# Patient Record
Sex: Female | Born: 1950
Health system: Southern US, Community
[De-identification: ages and names within clinical notes are randomized; demographics above are authoritative.]

## PROBLEM LIST (undated history)

## (undated) DIAGNOSIS — C801 Malignant (primary) neoplasm, unspecified: Secondary | ICD-10-CM

## (undated) DIAGNOSIS — F329 Major depressive disorder, single episode, unspecified: Secondary | ICD-10-CM

## (undated) DIAGNOSIS — M2550 Pain in unspecified joint: Secondary | ICD-10-CM

## (undated) DIAGNOSIS — G629 Polyneuropathy, unspecified: Secondary | ICD-10-CM

## (undated) DIAGNOSIS — I471 Supraventricular tachycardia, unspecified: Secondary | ICD-10-CM

## (undated) DIAGNOSIS — G8929 Other chronic pain: Secondary | ICD-10-CM

## (undated) DIAGNOSIS — G4730 Sleep apnea, unspecified: Secondary | ICD-10-CM

## (undated) DIAGNOSIS — K219 Gastro-esophageal reflux disease without esophagitis: Secondary | ICD-10-CM

## (undated) DIAGNOSIS — K5900 Constipation, unspecified: Secondary | ICD-10-CM

## (undated) DIAGNOSIS — R519 Headache, unspecified: Secondary | ICD-10-CM

## (undated) DIAGNOSIS — E119 Type 2 diabetes mellitus without complications: Secondary | ICD-10-CM

## (undated) DIAGNOSIS — F32A Depression, unspecified: Secondary | ICD-10-CM

## (undated) DIAGNOSIS — N3281 Overactive bladder: Secondary | ICD-10-CM

## (undated) DIAGNOSIS — Z8719 Personal history of other diseases of the digestive system: Secondary | ICD-10-CM

## (undated) DIAGNOSIS — N289 Disorder of kidney and ureter, unspecified: Secondary | ICD-10-CM

## (undated) DIAGNOSIS — R51 Headache: Secondary | ICD-10-CM

## (undated) DIAGNOSIS — E611 Iron deficiency: Secondary | ICD-10-CM

## (undated) DIAGNOSIS — Z8739 Personal history of other diseases of the musculoskeletal system and connective tissue: Secondary | ICD-10-CM

## (undated) DIAGNOSIS — F419 Anxiety disorder, unspecified: Secondary | ICD-10-CM

## (undated) DIAGNOSIS — Z8709 Personal history of other diseases of the respiratory system: Secondary | ICD-10-CM

## (undated) DIAGNOSIS — Z860100 Personal history of colon polyps, unspecified: Secondary | ICD-10-CM

## (undated) DIAGNOSIS — Z8619 Personal history of other infectious and parasitic diseases: Secondary | ICD-10-CM

## (undated) DIAGNOSIS — M549 Dorsalgia, unspecified: Secondary | ICD-10-CM

## (undated) DIAGNOSIS — R39198 Other difficulties with micturition: Secondary | ICD-10-CM

## (undated) DIAGNOSIS — E785 Hyperlipidemia, unspecified: Secondary | ICD-10-CM

## (undated) DIAGNOSIS — I10 Essential (primary) hypertension: Secondary | ICD-10-CM

## (undated) DIAGNOSIS — K859 Acute pancreatitis without necrosis or infection, unspecified: Secondary | ICD-10-CM

## (undated) DIAGNOSIS — R569 Unspecified convulsions: Secondary | ICD-10-CM

## (undated) DIAGNOSIS — M1990 Unspecified osteoarthritis, unspecified site: Secondary | ICD-10-CM

## (undated) HISTORY — PX: COLONOSCOPY: SHX174

## (undated) HISTORY — PX: TONSILLECTOMY: SUR1361

## (undated) HISTORY — PX: ABDOMINAL HYSTERECTOMY: SHX81

## (undated) HISTORY — PX: GASTRIC BYPASS: SHX52

## (undated) HISTORY — PX: LAPAROSCOPIC BILATERAL SALPINGO OOPHERECTOMY: SHX5890

## (undated) HISTORY — PX: JOINT REPLACEMENT: SHX530

## (undated) HISTORY — PX: EYE SURGERY: SHX253

## (undated) HISTORY — PX: HERNIA REPAIR: SHX51

## (undated) HISTORY — PX: ESOPHAGOGASTRODUODENOSCOPY: SHX1529

## (undated) HISTORY — PX: BACK SURGERY: SHX140

---

## 2013-01-10 ENCOUNTER — Other Ambulatory Visit: Payer: Self-pay | Admitting: Neurosurgery

## 2013-01-24 ENCOUNTER — Encounter (HOSPITAL_COMMUNITY): Payer: Self-pay | Admitting: Pharmacy Technician

## 2013-01-26 ENCOUNTER — Encounter (HOSPITAL_COMMUNITY)
Admission: RE | Admit: 2013-01-26 | Discharge: 2013-01-26 | Disposition: A | Discharge status: Home / Self Care | Payer: 59 | Source: Ambulatory Visit | Attending: Neurosurgery | Admitting: Neurosurgery

## 2013-01-26 ENCOUNTER — Encounter (HOSPITAL_COMMUNITY): Payer: Self-pay

## 2013-01-26 HISTORY — DX: Personal history of other diseases of the digestive system: Z87.19

## 2013-01-26 HISTORY — DX: Essential (primary) hypertension: I10

## 2013-01-26 HISTORY — DX: Major depressive disorder, single episode, unspecified: F32.9

## 2013-01-26 HISTORY — DX: Unspecified osteoarthritis, unspecified site: M19.90

## 2013-01-26 HISTORY — DX: Depression, unspecified: F32.A

## 2013-01-26 HISTORY — DX: Anxiety disorder, unspecified: F41.9

## 2013-01-26 LAB — BASIC METABOLIC PANEL
CO2: 31 mEq/L (ref 19–32)
Calcium: 9.5 mg/dL (ref 8.4–10.5)
Chloride: 102 mEq/L (ref 96–112)
Potassium: 3.1 mEq/L — ABNORMAL LOW (ref 3.5–5.1)
Sodium: 143 mEq/L (ref 135–145)

## 2013-01-26 LAB — CBC
Hemoglobin: 13.9 g/dL (ref 12.0–15.0)
MCV: 81.4 fL (ref 78.0–100.0)
Platelets: 338 10*3/uL (ref 150–400)
RBC: 5.1 MIL/uL (ref 3.87–5.11)
WBC: 8.2 10*3/uL (ref 4.0–10.5)

## 2013-01-26 LAB — TYPE AND SCREEN

## 2013-01-26 LAB — ABO/RH: ABO/RH(D): O POS

## 2013-01-26 LAB — SURGICAL PCR SCREEN: Staphylococcus aureus: NEGATIVE

## 2013-01-26 NOTE — Pre-Procedure Instructions (Signed)
[name] [name]  01/26/2013   Your procedure is scheduled on:  02/06/13  Report to [facility] at 845 AM.  Call this number if you have problems the morning of surgery: [phone]   Remember:   Do not eat food or drink liquids after midnight.   Take these medicines the morning of surgery with A SIP OF WATER: xanax,abilify,cymbalta,hydrocodone,toprol   Do not wear jewelry, make-up or nail polish.  Do not wear lotions, powders, or perfumes. You may wear deodorant.  Do not shave 48 hours prior to surgery. Men may shave face and neck.  Do not bring valuables to the hospital.  Contacts, dentures or bridgework may not be worn into surgery.  Leave suitcase in the car. After surgery it may be brought to your room.  For patients admitted to the hospital, checkout time is 11:00 AM the day of  discharge.   Patients discharged the day of surgery will not be allowed to drive  home.  Name and phone number of your driver: family  Special Instructions: Shower using CHG 2 nights before surgery and the night before surgery.  If you shower the day of surgery use CHG.  Use special wash - you have one bottle of CHG for all showers.  You should use approximately 1/3 of the bottle for each shower.   Please read over the following fact sheets that you were given: Pain Booklet, Coughing and Deep Breathing, Blood Transfusion Information, MRSA Information and Surgical Site Infection Prevention

## 2013-02-05 MED ORDER — DEXAMETHASONE SODIUM PHOSPHATE 10 MG/ML IJ SOLN
10.0000 mg | INTRAMUSCULAR | Status: AC
  Administered 2013-02-06: 10 mg via INTRAVENOUS
  Filled 2013-02-05: qty 1

## 2013-02-05 MED ORDER — CEFAZOLIN SODIUM-DEXTROSE 2-3 GM-% IV SOLR
2.0000 g | INTRAVENOUS | Status: DC
  Filled 2013-02-05: qty 50

## 2013-02-06 ENCOUNTER — Encounter (HOSPITAL_COMMUNITY): Payer: Self-pay | Admitting: Anesthesiology

## 2013-02-06 ENCOUNTER — Ambulatory Visit (HOSPITAL_COMMUNITY): Payer: 59

## 2013-02-06 ENCOUNTER — Encounter (HOSPITAL_COMMUNITY)
Admission: RE | Disposition: A | Discharge status: Home / Self Care | Payer: Self-pay | Source: Ambulatory Visit | Attending: Neurosurgery

## 2013-02-06 ENCOUNTER — Ambulatory Visit (HOSPITAL_COMMUNITY): Payer: 59 | Admitting: Anesthesiology

## 2013-02-06 ENCOUNTER — Inpatient Hospital Stay (HOSPITAL_COMMUNITY)
Admission: RE | Admit: 2013-02-06 | Discharge: 2013-02-10 | DRG: 460 | Disposition: A | Discharge status: Home / Self Care | Payer: 59 | Source: Ambulatory Visit | Attending: Neurosurgery | Admitting: Neurosurgery

## 2013-02-06 ENCOUNTER — Ambulatory Visit (HOSPITAL_COMMUNITY)
Admission: RE | Admit: 2013-02-06 | Discharge: 2013-02-06 | Disposition: A | Discharge status: Home / Self Care | Payer: 59 | Source: Ambulatory Visit | Attending: Neurosurgery | Admitting: Neurosurgery

## 2013-02-06 DIAGNOSIS — I10 Essential (primary) hypertension: Secondary | ICD-10-CM | POA: Diagnosis present

## 2013-02-06 DIAGNOSIS — M48062 Spinal stenosis, lumbar region with neurogenic claudication: Principal | ICD-10-CM | POA: Diagnosis present

## 2013-02-06 DIAGNOSIS — F3289 Other specified depressive episodes: Secondary | ICD-10-CM | POA: Diagnosis present

## 2013-02-06 DIAGNOSIS — K219 Gastro-esophageal reflux disease without esophagitis: Secondary | ICD-10-CM | POA: Diagnosis present

## 2013-02-06 DIAGNOSIS — F411 Generalized anxiety disorder: Secondary | ICD-10-CM | POA: Diagnosis present

## 2013-02-06 DIAGNOSIS — M4310 Spondylolisthesis, site unspecified: Secondary | ICD-10-CM | POA: Insufficient documentation

## 2013-02-06 DIAGNOSIS — Z79899 Other long term (current) drug therapy: Secondary | ICD-10-CM

## 2013-02-06 DIAGNOSIS — Z6839 Body mass index (BMI) 39.0-39.9, adult: Secondary | ICD-10-CM

## 2013-02-06 DIAGNOSIS — Q762 Congenital spondylolisthesis: Secondary | ICD-10-CM

## 2013-02-06 DIAGNOSIS — M129 Arthropathy, unspecified: Secondary | ICD-10-CM | POA: Diagnosis present

## 2013-02-06 DIAGNOSIS — K449 Diaphragmatic hernia without obstruction or gangrene: Secondary | ICD-10-CM | POA: Diagnosis present

## 2013-02-06 DIAGNOSIS — R079 Chest pain, unspecified: Secondary | ICD-10-CM | POA: Diagnosis not present

## 2013-02-06 SURGERY — POSTERIOR LUMBAR FUSION 1 LEVEL
Anesthesia: General | Site: Back | Wound class: Clean

## 2013-02-06 MED ORDER — SODIUM CHLORIDE 0.9 % IV SOLN: 250.0000 mL | INTRAVENOUS | Status: DC

## 2013-02-06 MED ORDER — FENTANYL CITRATE 0.05 MG/ML IJ SOLN
INTRAMUSCULAR | Status: DC | PRN
  Administered 2013-02-06 (×2): 50 ug via INTRAVENOUS
  Administered 2013-02-06: 100 ug via INTRAVENOUS
  Administered 2013-02-06 (×2): 50 ug via INTRAVENOUS
  Administered 2013-02-06: 100 ug via INTRAVENOUS

## 2013-02-06 MED ORDER — OXYCODONE HCL 5 MG PO TABS: 5.0000 mg | ORAL_TABLET | Freq: Once | ORAL | Status: DC | PRN

## 2013-02-06 MED ORDER — LIDOCAINE HCL 1 % IJ SOLN
INTRAMUSCULAR | Status: DC | PRN
  Administered 2013-02-06: 100 mg via INTRADERMAL

## 2013-02-06 MED ORDER — BACITRACIN 50000 UNITS IM SOLR
INTRAMUSCULAR | Status: AC
  Filled 2013-02-06: qty 1

## 2013-02-06 MED ORDER — NEOSTIGMINE METHYLSULFATE 1 MG/ML IJ SOLN
INTRAMUSCULAR | Status: DC | PRN
  Administered 2013-02-06: 4 mg via INTRAVENOUS

## 2013-02-06 MED ORDER — VITAMIN C 500 MG PO TABS
1000.0000 mg | ORAL_TABLET | Freq: Every day | ORAL | Status: DC
  Administered 2013-02-06 – 2013-02-09 (×4): 1000 mg via ORAL
  Filled 2013-02-06 (×5): qty 2

## 2013-02-06 MED ORDER — ACETAMINOPHEN 325 MG PO TABS: 650.0000 mg | ORAL_TABLET | ORAL | Status: DC | PRN

## 2013-02-06 MED ORDER — 0.9 % SODIUM CHLORIDE (POUR BTL) OPTIME
TOPICAL | Status: DC | PRN
  Administered 2013-02-06: 1000 mL

## 2013-02-06 MED ORDER — OXYCODONE HCL 5 MG/5ML PO SOLN: 5.0000 mg | Freq: Once | ORAL | Status: DC | PRN

## 2013-02-06 MED ORDER — MIDAZOLAM HCL 5 MG/5ML IJ SOLN
INTRAMUSCULAR | Status: DC | PRN
  Administered 2013-02-06: 2 mg via INTRAVENOUS

## 2013-02-06 MED ORDER — ARIPIPRAZOLE 2 MG PO TABS
2.0000 mg | ORAL_TABLET | Freq: Every day | ORAL | Status: DC
Start: 2013-02-07 — End: 2013-02-10
  Administered 2013-02-07 – 2013-02-09 (×3): 2 mg via ORAL
  Filled 2013-02-06 (×4): qty 1

## 2013-02-06 MED ORDER — SODIUM CHLORIDE 0.9 % IR SOLN
Status: DC | PRN
  Administered 2013-02-06: 12:00:00

## 2013-02-06 MED ORDER — ARTIFICIAL TEARS OP OINT
TOPICAL_OINTMENT | OPHTHALMIC | Status: DC | PRN
  Administered 2013-02-06: 1 via OPHTHALMIC

## 2013-02-06 MED ORDER — ACETAMINOPHEN 650 MG RE SUPP: 650.0000 mg | RECTAL | Status: DC | PRN

## 2013-02-06 MED ORDER — THROMBIN 20000 UNITS EX SOLR
CUTANEOUS | Status: DC | PRN
  Administered 2013-02-06: 12:00:00 via TOPICAL

## 2013-02-06 MED ORDER — OXYCODONE-ACETAMINOPHEN 5-325 MG PO TABS
1.0000 | ORAL_TABLET | ORAL | Status: DC | PRN
  Administered 2013-02-06 – 2013-02-07 (×4): 2 via ORAL
  Administered 2013-02-07: 1 via ORAL
  Administered 2013-02-07: 2 via ORAL
  Administered 2013-02-08: 1 via ORAL
  Administered 2013-02-08: 2 via ORAL
  Administered 2013-02-08: 1 via ORAL
  Administered 2013-02-08 – 2013-02-09 (×2): 2 via ORAL
  Administered 2013-02-09: 1 via ORAL
  Administered 2013-02-10: 2 via ORAL
  Filled 2013-02-06 (×4): qty 2
  Filled 2013-02-06: qty 1
  Filled 2013-02-06 (×8): qty 2

## 2013-02-06 MED ORDER — FUROSEMIDE 20 MG PO TABS: 20.0000 mg | ORAL_TABLET | Freq: Two times a day (BID) | ORAL | Status: DC

## 2013-02-06 MED ORDER — ADULT MULTIVITAMIN W/MINERALS CH
1.0000 | ORAL_TABLET | Freq: Two times a day (BID) | ORAL | Status: DC
  Administered 2013-02-06 – 2013-02-09 (×7): 1 via ORAL
  Filled 2013-02-06 (×9): qty 1

## 2013-02-06 MED ORDER — LIDOCAINE-EPINEPHRINE 1 %-1:100000 IJ SOLN
INTRAMUSCULAR | Status: DC | PRN
  Administered 2013-02-06: 10 mL

## 2013-02-06 MED ORDER — PHENOL 1.4 % MT LIQD: 1.0000 | OROMUCOSAL | Status: DC | PRN

## 2013-02-06 MED ORDER — CYCLOBENZAPRINE HCL 10 MG PO TABS
10.0000 mg | ORAL_TABLET | Freq: Three times a day (TID) | ORAL | Status: DC | PRN
  Administered 2013-02-07 – 2013-02-09 (×4): 10 mg via ORAL
  Filled 2013-02-06 (×4): qty 1

## 2013-02-06 MED ORDER — DOCUSATE SODIUM 100 MG PO CAPS
100.0000 mg | ORAL_CAPSULE | Freq: Two times a day (BID) | ORAL | Status: DC
  Administered 2013-02-06 – 2013-02-09 (×7): 100 mg via ORAL
  Filled 2013-02-06 (×7): qty 1

## 2013-02-06 MED ORDER — HYDROXYZINE HCL 25 MG PO TABS: 25.0000 mg | ORAL_TABLET | Freq: Three times a day (TID) | ORAL | Status: DC | PRN

## 2013-02-06 MED ORDER — METOCLOPRAMIDE HCL 5 MG/ML IJ SOLN: 10.0000 mg | Freq: Once | INTRAMUSCULAR | Status: DC | PRN

## 2013-02-06 MED ORDER — FUROSEMIDE 40 MG PO TABS
40.0000 mg | ORAL_TABLET | Freq: Every day | ORAL | Status: DC
  Administered 2013-02-07 – 2013-02-09 (×3): 40 mg via ORAL
  Filled 2013-02-06 (×4): qty 1

## 2013-02-06 MED ORDER — HYDROMORPHONE HCL PF 1 MG/ML IJ SOLN
0.2500 mg | INTRAMUSCULAR | Status: DC | PRN
  Administered 2013-02-06 (×2): 0.5 mg via INTRAVENOUS

## 2013-02-06 MED ORDER — HYDROMORPHONE HCL PF 1 MG/ML IJ SOLN
INTRAMUSCULAR | Status: AC
  Filled 2013-02-06: qty 1

## 2013-02-06 MED ORDER — ACETAMINOPHEN 10 MG/ML IV SOLN
INTRAVENOUS | Status: AC
  Administered 2013-02-06: 1000 mg via INTRAVENOUS
  Filled 2013-02-06: qty 100

## 2013-02-06 MED ORDER — SODIUM CHLORIDE 0.9 % IV SOLN
INTRAVENOUS | Status: AC
  Filled 2013-02-06: qty 500

## 2013-02-06 MED ORDER — VANCOMYCIN HCL IN DEXTROSE 1-5 GM/200ML-% IV SOLN
INTRAVENOUS | Status: AC
  Administered 2013-02-06: 1000 mg via INTRAVENOUS
  Filled 2013-02-06: qty 200

## 2013-02-06 MED ORDER — PHENTERMINE HCL 37.5 MG PO CAPS: 37.5000 mg | ORAL_CAPSULE | Freq: Two times a day (BID) | ORAL | Status: DC

## 2013-02-06 MED ORDER — PROPOFOL 10 MG/ML IV BOLUS
INTRAVENOUS | Status: DC | PRN
  Administered 2013-02-06: 200 mg via INTRAVENOUS

## 2013-02-06 MED ORDER — SODIUM CHLORIDE 0.9 % IJ SOLN
3.0000 mL | Freq: Two times a day (BID) | INTRAMUSCULAR | Status: DC
  Administered 2013-02-06 – 2013-02-09 (×7): 3 mL via INTRAVENOUS

## 2013-02-06 MED ORDER — ALPRAZOLAM 0.25 MG PO TABS
0.2500 mg | ORAL_TABLET | Freq: Three times a day (TID) | ORAL | Status: DC | PRN
  Administered 2013-02-06 – 2013-02-09 (×5): 0.5 mg via ORAL
  Filled 2013-02-06 (×5): qty 2

## 2013-02-06 MED ORDER — FUROSEMIDE 20 MG PO TABS
20.0000 mg | ORAL_TABLET | Freq: Every day | ORAL | Status: DC
  Administered 2013-02-06 – 2013-02-09 (×4): 20 mg via ORAL
  Filled 2013-02-06 (×5): qty 1

## 2013-02-06 MED ORDER — VANCOMYCIN HCL 10 G IV SOLR
1250.0000 mg | Freq: Two times a day (BID) | INTRAVENOUS | Status: DC
  Administered 2013-02-06 – 2013-02-08 (×4): 1250 mg via INTRAVENOUS
  Filled 2013-02-06 (×5): qty 1250

## 2013-02-06 MED ORDER — DULOXETINE HCL 60 MG PO CPEP
60.0000 mg | ORAL_CAPSULE | Freq: Two times a day (BID) | ORAL | Status: DC
Start: 2013-02-06 — End: 2013-02-10
  Administered 2013-02-06 – 2013-02-09 (×7): 60 mg via ORAL
  Filled 2013-02-06 (×9): qty 1

## 2013-02-06 MED ORDER — GLYCOPYRROLATE 0.2 MG/ML IJ SOLN
INTRAMUSCULAR | Status: DC | PRN
  Administered 2013-02-06: 0.4 mg via INTRAVENOUS

## 2013-02-06 MED ORDER — HYDROMORPHONE HCL PF 1 MG/ML IJ SOLN
0.5000 mg | INTRAMUSCULAR | Status: DC | PRN
  Administered 2013-02-06 – 2013-02-09 (×4): 1 mg via INTRAVENOUS
  Filled 2013-02-06 (×4): qty 1

## 2013-02-06 MED ORDER — LACTATED RINGERS IV SOLN
INTRAVENOUS | Status: DC | PRN
  Administered 2013-02-06 (×2): via INTRAVENOUS

## 2013-02-06 MED ORDER — METOPROLOL TARTRATE 1 MG/ML IV SOLN
INTRAVENOUS | Status: DC | PRN
  Administered 2013-02-06: 2 mg via INTRAVENOUS

## 2013-02-06 MED ORDER — METOPROLOL SUCCINATE ER 25 MG PO TB24
25.0000 mg | ORAL_TABLET | Freq: Every day | ORAL | Status: DC
Start: 2013-02-07 — End: 2013-02-10
  Administered 2013-02-07 – 2013-02-09 (×3): 25 mg via ORAL
  Filled 2013-02-06 (×4): qty 1

## 2013-02-06 MED ORDER — MENTHOL 3 MG MT LOZG: 1.0000 | LOZENGE | OROMUCOSAL | Status: DC | PRN

## 2013-02-06 MED ORDER — ATORVASTATIN CALCIUM 10 MG PO TABS
10.0000 mg | ORAL_TABLET | Freq: Every day | ORAL | Status: DC
  Administered 2013-02-06 – 2013-02-09 (×4): 10 mg via ORAL
  Filled 2013-02-06 (×5): qty 1

## 2013-02-06 MED ORDER — LIDOCAINE HCL 4 % MT SOLN
OROMUCOSAL | Status: DC | PRN
  Administered 2013-02-06: 4 mL via TOPICAL

## 2013-02-06 MED ORDER — PROMETHAZINE HCL 25 MG PO TABS
25.0000 mg | ORAL_TABLET | Freq: Four times a day (QID) | ORAL | Status: DC | PRN
  Administered 2013-02-09 (×2): 25 mg via ORAL
  Filled 2013-02-06 (×2): qty 1

## 2013-02-06 MED ORDER — LACTATED RINGERS IV SOLN
INTRAVENOUS | Status: DC | PRN
  Administered 2013-02-06: 10:00:00 via INTRAVENOUS

## 2013-02-06 MED ORDER — SODIUM CHLORIDE 0.9 % IJ SOLN: 3.0000 mL | INTRAMUSCULAR | Status: DC | PRN

## 2013-02-06 MED ORDER — ZOLPIDEM TARTRATE 5 MG PO TABS
5.0000 mg | ORAL_TABLET | Freq: Every evening | ORAL | Status: DC | PRN
  Administered 2013-02-07 – 2013-02-09 (×2): 5 mg via ORAL
  Filled 2013-02-06 (×2): qty 1

## 2013-02-06 MED ORDER — BUPIVACAINE HCL (PF) 0.25 % IJ SOLN
INTRAMUSCULAR | Status: DC | PRN
  Administered 2013-02-06: 10 mL

## 2013-02-06 MED ORDER — CLONIDINE HCL 0.1 MG PO TABS
0.1000 mg | ORAL_TABLET | Freq: Two times a day (BID) | ORAL | Status: DC
Start: 2013-02-06 — End: 2013-02-10
  Administered 2013-02-06 – 2013-02-09 (×7): 0.1 mg via ORAL
  Filled 2013-02-06 (×9): qty 1

## 2013-02-06 MED ORDER — ONDANSETRON HCL 4 MG/2ML IJ SOLN: 4.0000 mg | INTRAMUSCULAR | Status: DC | PRN

## 2013-02-06 MED ORDER — ONDANSETRON HCL 4 MG/2ML IJ SOLN
INTRAMUSCULAR | Status: DC | PRN
  Administered 2013-02-06: 4 mg via INTRAVENOUS

## 2013-02-06 MED ORDER — ROCURONIUM BROMIDE 100 MG/10ML IV SOLN
INTRAVENOUS | Status: DC | PRN
Start: 2013-02-06 — End: 2013-02-06
  Administered 2013-02-06: 10 mg via INTRAVENOUS
  Administered 2013-02-06: 50 mg via INTRAVENOUS

## 2013-02-06 SURGICAL SUPPLY — 66 items
BAG DECANTER FOR FLEXI CONT (MISCELLANEOUS) ×2 IMPLANT
BENZOIN TINCTURE PRP APPL 2/3 (GAUZE/BANDAGES/DRESSINGS) ×2 IMPLANT
BLADE SURG 11 STRL SS (BLADE) ×2 IMPLANT
BLADE SURG ROTATE 9660 (MISCELLANEOUS) IMPLANT
BRUSH SCRUB EZ PLAIN DRY (MISCELLANEOUS) ×2 IMPLANT
BUR MATCHSTICK NEURO 3.0 LAGG (BURR) ×2 IMPLANT
BUR PRECISION FLUTE 6.0 (BURR) ×2 IMPLANT
CANISTER SUCTION 2500CC (MISCELLANEOUS) ×2 IMPLANT
CAP LOCKING REVERE (Cap) ×8 IMPLANT
CLOTH BEACON ORANGE TIMEOUT ST (SAFETY) ×2 IMPLANT
CONT SPEC 4OZ CLIKSEAL STRL BL (MISCELLANEOUS) ×4 IMPLANT
COVER BACK TABLE 24X17X13 BIG (DRAPES) IMPLANT
COVER TABLE BACK 60X90 (DRAPES) ×2 IMPLANT
DECANTER SPIKE VIAL GLASS SM (MISCELLANEOUS) ×2 IMPLANT
DERMABOND ADVANCED (GAUZE/BANDAGES/DRESSINGS) ×1
DERMABOND ADVANCED .7 DNX12 (GAUZE/BANDAGES/DRESSINGS) ×1 IMPLANT
DRAPE C-ARM 42X72 X-RAY (DRAPES) ×4 IMPLANT
DRAPE LAPAROTOMY 100X72X124 (DRAPES) ×2 IMPLANT
DRAPE POUCH INSTRU U-SHP 10X18 (DRAPES) ×2 IMPLANT
DRAPE PROXIMA HALF (DRAPES) IMPLANT
DRAPE SURG 17X23 STRL (DRAPES) ×2 IMPLANT
DRSG OPSITE 4X5.5 SM (GAUZE/BANDAGES/DRESSINGS) ×2 IMPLANT
ELECT REM PT RETURN 9FT ADLT (ELECTROSURGICAL) ×2
ELECTRODE REM PT RTRN 9FT ADLT (ELECTROSURGICAL) ×1 IMPLANT
EVACUATOR 3/16  PVC DRAIN (DRAIN) ×1
EVACUATOR 3/16 PVC DRAIN (DRAIN) ×1 IMPLANT
GAUZE SPONGE 4X4 16PLY XRAY LF (GAUZE/BANDAGES/DRESSINGS) ×2 IMPLANT
GLOVE BIO SURGEON STRL SZ8 (GLOVE) ×4 IMPLANT
GLOVE BIOGEL PI IND STRL 7.5 (GLOVE) ×1 IMPLANT
GLOVE BIOGEL PI INDICATOR 7.5 (GLOVE) ×1
GLOVE ECLIPSE 7.5 STRL STRAW (GLOVE) ×2 IMPLANT
GLOVE ECLIPSE 8.5 STRL (GLOVE) ×2 IMPLANT
GLOVE EXAM NITRILE LRG STRL (GLOVE) ×4 IMPLANT
GLOVE EXAM NITRILE MD LF STRL (GLOVE) IMPLANT
GLOVE EXAM NITRILE XL STR (GLOVE) IMPLANT
GLOVE EXAM NITRILE XS STR PU (GLOVE) IMPLANT
GLOVE INDICATOR 8.5 STRL (GLOVE) ×4 IMPLANT
GLOVE SURG SS PI 8.0 STRL IVOR (GLOVE) ×6 IMPLANT
GOWN BRE IMP SLV AUR LG STRL (GOWN DISPOSABLE) IMPLANT
GOWN BRE IMP SLV AUR XL STRL (GOWN DISPOSABLE) ×8 IMPLANT
GOWN STRL REIN 2XL LVL4 (GOWN DISPOSABLE) IMPLANT
KIT BASIN OR (CUSTOM PROCEDURE TRAY) ×2 IMPLANT
KIT ROOM TURNOVER OR (KITS) ×2 IMPLANT
MILL MEDIUM DISP (BLADE) ×2 IMPLANT
NEEDLE HYPO 25X1 1.5 SAFETY (NEEDLE) ×2 IMPLANT
NS IRRIG 1000ML POUR BTL (IV SOLUTION) ×2 IMPLANT
PACK LAMINECTOMY NEURO (CUSTOM PROCEDURE TRAY) ×2 IMPLANT
PAD ARMBOARD 7.5X6 YLW CONV (MISCELLANEOUS) ×8 IMPLANT
ROD CURVED 5.5MMX50MM (Rod) ×4 IMPLANT
SCREW PEDICLE 6.5MMX45MM (Screw) ×4 IMPLANT
SCREW PEDICLE 6.5X40MM (Screw) ×4 IMPLANT
SCREW PEDICLE 6.5X45 (Screw) ×2 IMPLANT
SPACER CALIBER 10X22 9-13MM-12 (Spacer) ×4 IMPLANT
SPONGE GAUZE 4X4 12PLY (GAUZE/BANDAGES/DRESSINGS) ×2 IMPLANT
SPONGE LAP 4X18 X RAY DECT (DISPOSABLE) IMPLANT
SPONGE SURGIFOAM ABS GEL 100 (HEMOSTASIS) ×2 IMPLANT
STRIP CLOSURE SKIN 1/2X4 (GAUZE/BANDAGES/DRESSINGS) ×2 IMPLANT
SUT VIC AB 0 CT1 18XCR BRD8 (SUTURE) ×2 IMPLANT
SUT VIC AB 0 CT1 8-18 (SUTURE) ×2
SUT VIC AB 2-0 CT1 18 (SUTURE) ×2 IMPLANT
SUT VICRYL 4-0 PS2 18IN ABS (SUTURE) ×2 IMPLANT
SYR 20ML ECCENTRIC (SYRINGE) ×2 IMPLANT
TOWEL OR 17X24 6PK STRL BLUE (TOWEL DISPOSABLE) ×2 IMPLANT
TOWEL OR 17X26 10 PK STRL BLUE (TOWEL DISPOSABLE) ×2 IMPLANT
TRAY FOLEY CATH 14FRSI W/METER (CATHETERS) ×2 IMPLANT
WATER STERILE IRR 1000ML POUR (IV SOLUTION) ×2 IMPLANT

## 2013-02-06 NOTE — Anesthesia Preprocedure Evaluation (Addendum)
Anesthesia Evaluation  Patient identified by MRN, date of birth, ID band Patient awake    Reviewed: Allergy & Precautions, H&P , NPO status , Patient's Chart, lab work & pertinent test results, reviewed documented beta blocker date and time   History of Anesthesia Complications Negative for: history of anesthetic complications  Airway Mallampati: II TM Distance: >3 FB Neck ROM: full    Dental  (+) Teeth Intact, Caps and Dental Advisory Given   Pulmonary neg pulmonary ROS,  breath sounds clear to auscultation        Cardiovascular hypertension, Pt. on medications and Pt. on home beta blockers + dysrhythmias Supra Ventricular Tachycardia Rhythm:regular     Neuro/Psych PSYCHIATRIC DISORDERS Anxiety Depression negative neurological ROS     GI/Hepatic Neg liver ROS, hiatal hernia (s/p repair), Gastric bypass   Endo/Other  Morbid obesity  Renal/GU negative Renal ROS  negative genitourinary   Musculoskeletal  (+) Arthritis -, Osteoarthritis,    Abdominal (+) + obese,   Peds  Hematology negative hematology ROS (+)   Anesthesia Other Findings See surgeon's H&P   Reproductive/Obstetrics negative OB ROS                         Anesthesia Physical Anesthesia Plan  ASA: III  Anesthesia Plan: General   Post-op Pain Management:    Induction: Intravenous  Airway Management Planned: Oral ETT  Additional Equipment:   Intra-op Plan:   Post-operative Plan: Extubation in OR  Informed Consent: I have reviewed the patients History and Physical, chart, labs and discussed the procedure including the risks, benefits and alternatives for the proposed anesthesia with the patient or authorized representative who has indicated his/her understanding and acceptance.   Dental Advisory Given  Plan Discussed with: CRNA and Surgeon  Anesthesia Plan Comments:         Anesthesia Quick Evaluation

## 2013-02-06 NOTE — Progress Notes (Signed)
ANTIBIOTIC CONSULT NOTE - INITIAL  Pharmacy Consult for vancomycin Indication: prophylaxis while lumbar drain in s/p complex back surgery  Allergies  Allergen Reactions  . Bee Venom Anaphylaxis  . Erythrocin (Erythromycin)     Unknown reaction  . Fioricet (Butalbital-Apap-Caffeine) Other (See Comments)    dizziness  . Other     Barbiturates-reaction unknown.  Marland Kitchen Penicillins Other (See Comments)    Reaction unknown  . Clindamycin/Lincomycin Rash  . Morphine And Related Rash  . Phenobarbital Rash  . Teflaro (Ceftaroline) Rash  . Vancomycin Rash    Patient Measurements:    Body Weight: 95 kg Vital Signs: Temp: 98.1 F (36.7 C) (03/10 1606) Temp src: Oral (03/10 1606) BP: 147/71 mmHg (03/10 1606) Pulse Rate: 96 (03/10 1606) Intake/Output from previous day:   Intake/Output from this shift: Total I/O In: 2000 [I.V.:2000] Out: 455 [Urine:155; Blood:300]  Labs: No results found for this basename: WBC, HGB, PLT, LABCREA, CREATININE,  in the last 72 hours CrCl is unknown because there is no height on file for the current visit. No results found for this basename: VANCOTROUGH, VANCOPEAK, VANCORANDOM, GENTTROUGH, GENTPEAK, GENTRANDOM, TOBRATROUGH, TOBRAPEAK, TOBRARND, AMIKACINPEAK, AMIKACINTROU, AMIKACIN,  in the last 72 hours   Microbiology: No results found for this or any previous visit (from the past 720 hour(s)).  Medical History: Past Medical History  Diagnosis Date  . Hypertension   . Depression   . Anxiety   . H/O hiatal hernia   . Arthritis   . Cellulitis and abscess of leg     last yr    Medications:  Prescriptions prior to admission  Medication Sig Dispense Refill  . ALPRAZolam (XANAX) 0.5 MG tablet Take 0.25-0.5 mg by mouth 3 (three) times daily as needed for anxiety.      . ARIPiprazole (ABILIFY) 2 MG tablet Take 2 mg by mouth daily.      Marland Kitchen ascorbic acid (VITAMIN C) 1000 MG tablet Take 1,000 mg by mouth daily.      Marland Kitchen atorvastatin (LIPITOR) 10 MG tablet  Take 10 mg by mouth daily.      . bisacodyl (DULCOLAX) 5 MG EC tablet Take 5 mg by mouth daily as needed for constipation.      . Cholecalciferol (VITAMIN D PO) Take 2,000 Units by mouth daily.      . cloNIDine (CATAPRES) 0.1 MG tablet Take 0.1 mg by mouth 2 (two) times daily.      . DULoxetine (CYMBALTA) 60 MG capsule Take 60 mg by mouth 2 (two) times daily.      . fish oil-omega-3 fatty acids 1000 MG capsule Take 1 g by mouth 2 (two) times daily.      . furosemide (LASIX) 20 MG tablet Take 20-40 mg by mouth 2 (two) times daily. Take 2 tablets in the AM and 1 tablet in the PM      . HYDROcodone-acetaminophen (NORCO) 10-325 MG per tablet Take 0.5-1 tablets by mouth every 8 (eight) hours as needed for pain.      . hydrOXYzine (ATARAX/VISTARIL) 25 MG tablet Take 25 mg by mouth 3 (three) times daily as needed for itching.      . metoprolol succinate (TOPROL-XL) 25 MG 24 hr tablet Take 25 mg by mouth daily.      . Multiple Vitamin (MULTIVITAMIN WITH MINERALS) TABS Take 1 tablet by mouth 2 (two) times daily.      Marland Kitchen zolpidem (AMBIEN) 10 MG tablet Take 10 mg by mouth at bedtime as needed for sleep.      Marland Kitchen  phentermine 37.5 MG capsule Take 37.5 mg by mouth 2 (two) times daily.      . promethazine (PHENERGAN) 25 MG tablet Take 25 mg by mouth every 6 (six) hours as needed for nausea.       Assessment: [name] [name] is a 62 yo F s/o complex back surgery to continue on vancomycin per pharmacy protocol until her lumbar drain is removed.  She has a documented allergy of rash to vancomycin but she was given vanc 1 gm pre-op at 1047 am.   Wt 95 kg. Creat cl 86 ml/min.  Goal of Therapy:  Vancomycin trough level 10-15 mcg/ml  Plan:  1. Vancomycin 1250 mg IV q12h until lumbar drain out 2. F/u renal function, WBC, temp. Herby Abraham, Pharm.D. [phone] 02/06/2013 4:35 PM

## 2013-02-06 NOTE — Preoperative (Signed)
Beta Blockers   Reason not to administer Beta Blockers:Metoprolol 02/07/13 0600

## 2013-02-06 NOTE — Clinical Social Work Note (Signed)
Clinical Social Work   CSW received consult for SNF. Upon chart review, pt had surgery today and awaiting PT/OT evals for discharge recommendations. Evals are needed for insurance prior auth for SNF. CSW will assess pt for SNF, if appropriate. CSW will continue to follow.   Dede Query, MSW, [facility] [phone]

## 2013-02-06 NOTE — Anesthesia Postprocedure Evaluation (Signed)
Anesthesia Post Note  Patient: [name] [name]  Procedure(s) Performed: Procedure(s) (LRB): POSTERIOR LUMBAR FUSION 1 LEVEL (N/A)  Anesthesia type: general  Patient location: PACU  Post pain: Pain level controlled  Post assessment: Patient's Cardiovascular Status Stable  Last Vitals:  Filed Vitals:   02/06/13 1606  BP: 147/71  Pulse: 96  Temp: 36.7 C  Resp: 17    Post vital signs: Reviewed and stable  Level of consciousness: sedated  Complications: No apparent anesthesia complications

## 2013-02-06 NOTE — Transfer of Care (Signed)
Immediate Anesthesia Transfer of Care Note  Patient: [name] [name]  Procedure(s) Performed: Procedure(s) with comments: POSTERIOR LUMBAR FUSION 1 LEVEL (N/A) - Lumbar four to lumbar five posterior lumbar interbody fusion  Patient Location: PACU  Anesthesia Type:General  Level of Consciousness: awake, alert , oriented and patient cooperative  Airway & Oxygen Therapy: Patient Spontanous Breathing and Patient connected to face mask oxygen  Post-op Assessment: Report given to PACU RN and Post -op Vital signs reviewed and stable  Post vital signs: Reviewed and stable  Complications: No apparent anesthesia complications

## 2013-02-06 NOTE — Op Note (Signed)
Preoperative diagnosis: Grade 1 spondylolisthesis L4-5 with severe lumbar spinal stenosis L4-5 and severe foraminal stenosis of the L4 and L5 nerve root. Patient had severe mechanical low back pain and neurogenic claudication and right greater left L4 and L5 radiculopathies  Postoperative diagnosis: Same  Procedure: Decompressive lumbar laminectomy with more work than would be required in a standard interbody fusion at L4-5 with radical foraminotomies of the L4 and L5 nerve roots complete facetectomies bilaterally at L4-5  #2 posterior lumbar interbody fusion L4-5 using expandable caliber globus peek cages packed with local autograft mixed DBX  #3 pedicle screw fixation L4-5 using the globus 5.5 Revere pedicle to system  #4 posterior lateral arthrodesis L4-5 using local autograft mixed with DBX  #5 open reduction spinal deformity  #6 placement of large Hemovac drain  Surgeon: Jillyn Hidden cram  Assistant: Sherilyn Cooter pool  Anesthesia: Gen.  EBL: 400  History of present illness: Patient is a very pleasant 62 year old female who has had long-standing back pain and bilateral leg pain with neurogenic claudication he would radiate to her hips down the back and outside of her thighs into the front of her shin top of her feet and big toe. This was consistent with bilateral L4 and L5 radiculopathies. Workup with MRI scan and plain films revealed a spondylolisthesis at L4-5 with marked facet arthropathy and severe foraminal stenosis of the L4 and L5 nerve root. And due to patient's failure conservative treatment with physical therapy and epidural steroid injections she was recommended decompression stabilization procedure at L4-5 I extensively went over the risks and benefits of the operation with her as well as therapy course expectations of outcome and alternatives to surgery and she understood and agreed to proceed forward.  Operative procedure: Patient was brought into the or was induced under general  anesthesia positioned prone the Wilson frame her back was prepped and draped in routine sterile fashion. After preoperative x-ray localize the appropriate level the was infiltrated with 10 cc lidocaine with epi a midline incision was made and Bovie left car was taken the subcutaneous tissues and subperiosteal dissections care lamina of L4 and L5 bilaterally. Also exposed the TPS and pedicles of L4 and L5 bilaterally. Interoperative x-ray confirmed the appropriate levels the spinous process of L4 smooth central decompression was begun complete laminectomy centrally at L4 then there was marked facet arthropathy causing severe hourglass compression of thecal sac proximal aspect the L5 nerve root this was teased off the dura and a complete facetectomy was performed. In addition there was a large fragment of outcome of the superior tickling facet underneath the pars displacing the undersurface of the 4 roots predominantly on the left. This is all dissected off of the L4 nerve root and removed decompressing the L4 nerve root extensively upper foramen. Under biting superior tickling facet a ladder cyst lateral margins disc space and complete foraminotomies performed at L4 and L5. Again a decompression and foraminotomies the L4 and L5 nerve roots were widely decompressed dorsally. Attention second pedicle screw placement using a high-speed drill pilot holes were drilled fluoroscopy confirmed that the trajectory pedicle was cannulated probed 55 Probed again and 05/04/1944 screws inserted L4 bilaterally and 6 x 40 L5 bilaterally after all the screws and placed and position was confirmed with fluoroscopy attention second the interbody work. The spaces were coagulated and incised cleaned out and a size 10 distractor was inserted the patient's right the endplates (left with a 9 rotating cutter and Epstein curettes. After adequate endplate preparation a large central  disc removed 9 expandable cage was inserted and expanded 7  turns approximately 12-13 mm. Fluoroscopy again was used the step along the way to confirm Deppen trajectory. Then the distractor was removed the spaces. And some patient's right side local autograft packed mixed with DBX was packed centrally and then the 9 mm cage was inserted the patient's right side and expanded in similar fashion. After only a by [name] Lee implants and placed postop AP and lateral fluoroscopy confirmed good position of screws and implants the was then copious irrigated meticulous hemostasis was maintained aggressive decortication was care MTPs or lateral gutters the remainder local autograft DBX mixture was then packed posterior laterally. The spinal listhesis was significantly reduced after the interbody work. Then 2 rods were placed and all nuts were torqued down and anchored down. Then the large director was placed the nerve foraminal reinspected confirm patency Gelfoam was overlaid top of the dura and the muscle fascia proximal in layers with after Vicryl and the skin was closed with a running 4 septic or. Dermabond benzo and Steri-Strips were applied patient recovered in stable condition.

## 2013-02-06 NOTE — Anesthesia Procedure Notes (Signed)
Procedure Name: Intubation Date/Time: 02/06/2013 10:43 AM Performed by: Leona Singleton A Pre-anesthesia Checklist: Patient identified Patient Re-evaluated:Patient Re-evaluated prior to inductionOxygen Delivery Method: Circle system utilized Preoxygenation: Pre-oxygenation with 100% oxygen Intubation Type: IV induction Ventilation: Two handed mask ventilation required Laryngoscope Size: Miller and 2 Grade View: Grade I Tube type: Oral Tube size: 7.0 mm Number of attempts: 1 Airway Equipment and Method: Stylet and LTA kit utilized Placement Confirmation: ETT inserted through vocal cords under direct vision,  positive ETCO2 and breath sounds checked- equal and bilateral Secured at: 21 cm Tube secured with: Tape Dental Injury: Teeth and Oropharynx as per pre-operative assessment

## 2013-02-06 NOTE — H&P (Signed)
[name] [name] is an 62 y.o. female.   Chief Complaint: Back and right greater than left leg pain HPI: Patient is a very pleasant 62 year old female who presents with predominantly mechanical back pain but also right left leg radiculopathy consistent with an L4 and L5 nerve root pattern.  In addition she reports claudication very short distances less than a quarter block. Patient imaging studies revealed a grade 1 spondylolisthesis and severe spinal stenosis at L4-5 with severe stenosis of the L4 and L5 nerve roots bilaterally. Patient went through physical therapy epidural steroid injections and was refractory to all forms of conservative treatment. And due to her progression of pedicle syndrome and imaging findings and failure conservative treatment she was recommended decompressive position procedure at L4-5 extensively the risks benefits of the operation with her as well as  Of course and expectations of outcome alternatives of surgery and she understood and agreed to proceed forward.  Past Medical History  Diagnosis Date  . Hypertension   . Depression   . Anxiety   . H/O hiatal hernia   . Arthritis   . Cellulitis and abscess of leg     last yr    Past Surgical History  Procedure Laterality Date  . Cesarean section    . Hernia repair    . Abdominal hysterectomy    . Gastric bypass    . Joint replacement      bil knee  . Tonsillectomy      History reviewed. No pertinent family history. Social History:  reports that she has never smoked. She does not have any smokeless tobacco history on file. She reports that  drinks alcohol. She reports that she does not use illicit drugs.  Allergies:  Allergies  Allergen Reactions  . Bee Venom Anaphylaxis  . Erythrocin (Erythromycin)     Unknown reaction  . Fioricet (Butalbital-Apap-Caffeine) Other (See Comments)    dizziness  . Other     Barbiturates-reaction unknown.  Marland Kitchen Penicillins Other (See Comments)    Reaction unknown  .  Clindamycin/Lincomycin Rash  . Morphine And Related Rash  . Phenobarbital Rash  . Teflaro (Ceftaroline) Rash  . Vancomycin Rash    Medications Prior to Admission  Medication Sig Dispense Refill  . ALPRAZolam (XANAX) 0.5 MG tablet Take 0.25-0.5 mg by mouth 3 (three) times daily as needed for anxiety.      . ARIPiprazole (ABILIFY) 2 MG tablet Take 2 mg by mouth daily.      Marland Kitchen ascorbic acid (VITAMIN C) 1000 MG tablet Take 1,000 mg by mouth daily.      Marland Kitchen atorvastatin (LIPITOR) 10 MG tablet Take 10 mg by mouth daily.      . bisacodyl (DULCOLAX) 5 MG EC tablet Take 5 mg by mouth daily as needed for constipation.      . Cholecalciferol (VITAMIN D PO) Take 2,000 Units by mouth daily.      . cloNIDine (CATAPRES) 0.1 MG tablet Take 0.1 mg by mouth 2 (two) times daily.      . DULoxetine (CYMBALTA) 60 MG capsule Take 60 mg by mouth 2 (two) times daily.      . fish oil-omega-3 fatty acids 1000 MG capsule Take 1 g by mouth 2 (two) times daily.      . furosemide (LASIX) 20 MG tablet Take 20-40 mg by mouth 2 (two) times daily. Take 2 tablets in the AM and 1 tablet in the PM      . HYDROcodone-acetaminophen (NORCO) 10-325 MG per tablet  Take 0.5-1 tablets by mouth every 8 (eight) hours as needed for pain.      . hydrOXYzine (ATARAX/VISTARIL) 25 MG tablet Take 25 mg by mouth 3 (three) times daily as needed for itching.      . metoprolol succinate (TOPROL-XL) 25 MG 24 hr tablet Take 25 mg by mouth daily.      . Multiple Vitamin (MULTIVITAMIN WITH MINERALS) TABS Take 1 tablet by mouth 2 (two) times daily.      Marland Kitchen zolpidem (AMBIEN) 10 MG tablet Take 10 mg by mouth at bedtime as needed for sleep.      . phentermine 37.5 MG capsule Take 37.5 mg by mouth 2 (two) times daily.      . promethazine (PHENERGAN) 25 MG tablet Take 25 mg by mouth every 6 (six) hours as needed for nausea.        No results found for this or any previous visit (from the past 48 hour(s)). No results found.  Review of Systems   Constitutional: Negative.   HENT: Negative.   Eyes: Negative.   Respiratory: Negative.   Cardiovascular: Negative.   Gastrointestinal: Negative.   Genitourinary: Negative.   Musculoskeletal: Positive for myalgias, back pain and joint pain.  Skin: Negative.   Neurological: Negative.   Endo/Heme/Allergies: Negative.   Psychiatric/Behavioral: Negative.     Blood pressure 149/87, pulse 82, temperature 97.7 F (36.5 C), temperature source Oral, resp. rate 18, SpO2 97.00%. Physical Exam  Constitutional: She is oriented to person, place, and time. She appears well-developed.  Eyes: Pupils are equal, round, and reactive to light.  Neck: Normal range of motion.  Respiratory: Effort normal.  GI: Soft.  Neurological: She is alert and oriented to person, place, and time. She has normal strength. GCS eye subscore is 4. GCS verbal subscore is 5. GCS motor subscore is 6.  Reflex Scores:      Tricep reflexes are 1+ on the right side and 1+ on the left side.      Bicep reflexes are 1+ on the right side and 1+ on the left side.      Brachioradialis reflexes are 1+ on the right side and 1+ on the left side.      Patellar reflexes are 1+ on the right side and 1+ on the left side.      Achilles reflexes are 1+ on the right side and 1+ on the left side. Strength is 5 out of 5 in her iliopsoas, quads, hip she's, gastrocs, anterior tibialis, and EHL.     Assessment/Plan 62 year female presents for an L4-5 decompression and fusion.  CRAM,GARY P 02/06/2013, 9:48 AM

## 2013-02-07 MED FILL — Heparin Sodium (Porcine) Inj 1000 Unit/ML: INTRAMUSCULAR | Qty: 30 | Status: AC

## 2013-02-07 MED FILL — Sodium Chloride Irrigation Soln 0.9%: Qty: 3000 | Status: AC

## 2013-02-07 MED FILL — Sodium Chloride IV Soln 0.9%: INTRAVENOUS | Qty: 1000 | Status: AC

## 2013-02-07 NOTE — Clinical Social Work Note (Signed)
Clinical Social Work   CSW reviewed chart and disucces pt with RN during progression. PT is recommending HHPT. CSW updated RNCM. CSW is signing off as no further needs are identified. Please reconsult if a need arises prior to discharge.   Dede Query, MSW, [facility] [phone]

## 2013-02-07 NOTE — Progress Notes (Signed)
   CARE MANAGEMENT NOTE 02/07/2013  Patient:  [name] [name], [name] [name]   Account Number:  [id number]  Date Initiated:  02/07/2013  Documentation initiated by:  [facility]  Subjective/Objective Assessment:   admitted postop  PLIF L4-5     Action/Plan:   PT/OT evals-recommending HHPT   Anticipated DC Date:  02/10/2013   Anticipated DC Plan:  HOME W HOME HEALTH SERVICES      DC Planning Services  CM consult      [facility] Choice  HOME HEALTH   Choice offered to / List presented to:  C-1 Patient        HH arranged  HH-2 PT      [facility] agency  [facility]   Status of service:  Completed, signed off Medicare Important Message given?   (If response is "NO", the following Medicare IM given date fields will be blank) Date Medicare IM given:   Date Additional Medicare IM given:    Discharge Disposition:  HOME W HOME HEALTH SERVICES  Per UR Regulation:  Reviewed for med. necessity/level of care/duration of stay  If discussed at Long Length of Stay Meetings, dates discussed:    Comments:  02/07/2013 1230 NCM spoke to pt and had [facility] in the past. Requesting their services. No DME is needed. Her boyfriend is at home to assist with her care. Isidoro Donning RN CCM Case Mgmt phone [phone]

## 2013-02-07 NOTE — Evaluation (Signed)
Occupational Therapy Evaluation Patient Details Name: [name] [name] MRN: [id number] DOB: [birth year] Today's Date: 02/07/2013 Time: 4010-2725 OT Time Calculation (min): 60 min  OT Assessment / Plan / Recommendation Clinical Impression  Pt is a 62 yr old female admitted for L4-L5 posterior fusion.  Pt now with slight decreased independence with basic selfcare tasks. Will benefit from acute OT to help increase independence and education on safe performance of selfcare tasks.  Pt will be alone after 1-2 days so feel she will benefit from [facility] eval for safety and continued education.    OT Assessment  Patient needs continued OT Services    Follow Up Recommendations  Home health OT    Barriers to Discharge None    Equipment Recommendations  None recommended by OT       Frequency  Min 2X/week    Precautions / Restrictions Precautions Precautions: Back Precaution Booklet Issued: Yes (comment) Precaution Comments: pt has post op back book Required Braces or Orthoses: Spinal Brace Spinal Brace: Lumbar corset   Pertinent Vitals/Pain Pt reported no pain during session    ADL  Eating/Feeding: Simulated;Independent Grooming: Simulated;Supervision/safety Where Assessed - Grooming: Supported standing Upper Body Bathing: Simulated;Set up Where Assessed - Upper Body Bathing: Unsupported sitting Lower Body Bathing: Simulated;Minimal assistance Where Assessed - Lower Body Bathing: Supported sit to stand Upper Body Dressing: Performed;Supervision/safety Where Assessed - Upper Body Dressing: Unsupported sitting Lower Body Dressing: Simulated;Minimal assistance Where Assessed - Lower Body Dressing: Supported sit to stand Toilet Transfer: Research scientist (life sciences) Method: Other (comment) (ambulating with RW) Acupuncturist: Comfort height toilet;Grab bars Toileting - Clothing Manipulation and Hygiene: Simulated;Supervision/safety;Minimal  assistance Tub/Shower Transfer: Landscape architect Method: Science writer: Shower seat with back Equipment Used: Back brace;Rolling walker Transfers/Ambulation Related to ADLs: Pt is overall close supervision for mobility using the RW and back corset. ADL Comments: Pt only able to recall 1/3 back precautions.  Began education on use of AE for LB selfcare and provided handout.  Discussed kitchen safety as well if using the RW at home.  Pt is slightly worried about being able to recall everything at home and she will be alone after the first couple of days.  Feel she will benefit from [facility] eval for safety and IADL instruction.    OT Diagnosis: Generalized weakness;Acute pain  OT Problem List: Decreased strength;Impaired balance (sitting and/or standing);Decreased knowledge of use of DME or AE;Decreased knowledge of precautions;Pain OT Treatment Interventions: Self-care/ADL training;Patient/family education;DME and/or AE instruction;Balance training   OT Goals Acute Rehab OT Goals OT Goal Formulation: With patient Time For Goal Achievement: 02/14/13 Potential to Achieve Goals: Good ADL Goals Pt Will Perform Lower Body Bathing: with modified independence;Sit to stand from bed;with adaptive equipment ADL Goal: Lower Body Bathing - Progress: Goal set today Pt Will Perform Lower Body Dressing: with modified independence;Sit to stand from chair;with adaptive equipment ADL Goal: Lower Body Dressing - Progress: Goal set today Pt Will Transfer to Toilet: with modified independence;with DME;Regular height toilet;Maintaining back safety precautions ADL Goal: Toilet Transfer - Progress: Goal set today Pt Will Perform Toileting - Clothing Manipulation: with modified independence;Sitting on 3-in-1 or toilet;Standing ADL Goal: Toileting - Clothing Manipulation - Progress: Goal set today Pt Will Perform Toileting - Hygiene: with modified  independence;Sit to stand from 3-in-1/toilet;with adaptive equipment ADL Goal: Toileting - Hygiene - Progress: Goal set today Pt Will Perform Tub/Shower Transfer: Shower transfer;with supervision;with DME;Ambulation ADL Goal: Tub/Shower Transfer - Progress: Goal set today Miscellaneous OT Goals Miscellaneous OT  Goal #1: Pt will recall and state 3/3 back precautions independently. OT Goal: Miscellaneous Goal #1 - Progress: Goal set today  Visit Information  Last OT Received On: 02/07/13 Assistance Needed: +1    Subjective Data  Subjective: They need to have a class on this before surgery. Patient Stated Goal: To get back to being as independent as possible.   Prior Functioning     Home Living Lives With: Alone Available Help at Discharge: Family Type of Home: House Home Access: Stairs to enter Entrance Stairs-Number of Steps: 1 small Home Layout: Two level;Able to live on main level with bedroom/bathroom Bathroom Shower/Tub: Health visitor: Standard Home Adaptive Equipment: Straight cane;Bedside commode/3-in-1;Walker - standard;Walker - rolling;Shower chair with back Prior Function Level of Independence: Independent Able to Take Stairs?: Yes Driving: Yes Communication Communication: No difficulties Dominant Hand: Right         Vision/Perception Vision - History Baseline Vision: Wears glasses all the time Patient Visual Report: No change from baseline Vision - Assessment Eye Alignment: Within Functional Limits Vision Assessment: Vision tested Perception Perception: Within Functional Limits Praxis Praxis: Intact   Cognition  Cognition Overall Cognitive Status: Appears within functional limits for tasks assessed/performed Arousal/Alertness: Awake/alert Behavior During Session: [facility] for tasks performed    Extremity/Trunk Assessment Right Upper Extremity Assessment RUE ROM/Strength/Tone: WFL for tasks assessed RUE Sensation: WFL - Light  Touch RUE Coordination: WFL - gross/fine motor Left Upper Extremity Assessment LUE ROM/Strength/Tone: WFL for tasks assessed LUE Sensation: WFL - Light Touch LUE Coordination: WFL - gross/fine motor Trunk Assessment Trunk Assessment: Normal     Mobility Bed Mobility Bed Mobility: Rolling Left;Left Sidelying to Sit;Sit to Sidelying Left Rolling Left: 5: Supervision Left Sidelying to Sit: 5: Supervision;HOB flat Sit to Sidelying Left: 5: Supervision;HOB flat Details for Bed Mobility Assistance: min instructional cueing for technique to adhere to back precautions Transfers Transfers: Sit to Stand Sit to Stand: 5: Supervision;With upper extremity assist;From bed Stand to Sit: To toilet;With upper extremity assist;5: Supervision        Balance Balance Balance Assessed: Yes Dynamic Standing Balance Dynamic Standing - Balance Support: Right upper extremity supported;Left upper extremity supported Dynamic Standing - Level of Assistance: 5: Stand by assistance   End of Session OT - End of Session Equipment Utilized During Treatment: Back brace Activity Tolerance: Patient tolerated treatment well Patient left: in bed;with call bell/phone within reach Nurse Communication: Mobility status     Jamisha Hoeschen OTR/L Pager number [phone] 02/07/2013, 4:36 PM

## 2013-02-07 NOTE — Evaluation (Signed)
Physical Therapy Evaluation Patient Details Name: [name] [name] MRN: [id number] DOB: 26-Jul-1951  Today's Date: 02/07/2013 Time: 1308-6578 PT Time Calculation (min): 28 min  PT Assessment / Plan / Recommendation Clinical Impression  pt is s/p Decompressive lumbar laminectomy, fusion at L4-5 with radical foraminotomies of the L4 and L5 nerve roots complete facetectomies bilaterally at L4-5. Will benefit from PT to maximize independence for return home with family support and HHPT    PT Assessment  Patient needs continued PT services    Follow Up Recommendations  Home health PT;Supervision - Intermittent    Does the patient have the potential to tolerate intense rehabilitation      Barriers to Discharge None      Equipment Recommendations       Recommendations for Other Services     Frequency Min 6X/week    Precautions / Restrictions Precautions Precautions: Back Precaution Booklet Issued: Yes (comment) Precaution Comments: pt has post op back book Required Braces or Orthoses: Spinal Brace Spinal Brace: Lumbar corset   Pertinent Vitals/Pain Back pain 6/10 , RN gave meds      Mobility  Bed Mobility Bed Mobility: Rolling Right;Right Sidelying to Sit Rolling Right: 4: Min guard Right Sidelying to Sit: 4: Min guard Details for Bed Mobility Assistance: incr time and verbal/tactile  cues to log roll Transfers Transfers: Sit to Stand;Stand to Sit Sit to Stand: 4: Min guard Stand to Sit: 4: Min guard Details for Transfer Assistance: cues for back precautions, hand placement and safety  Ambulation/Gait Ambulation/Gait Assistance: 4: Min guard Ambulation Distance (Feet): 130 Feet Assistive device: Rolling walker Ambulation/Gait Assistance Details: verbal cues posture and  to push RW (not pick it up)  Gait Pattern: Step-through pattern;Decreased stride length    Exercises     PT Diagnosis: Difficulty walking  PT Problem List: Decreased activity tolerance;Decreased  balance;Decreased mobility;Decreased knowledge of use of DME;Decreased safety awareness;Decreased knowledge of precautions PT Treatment Interventions: DME instruction;Gait training;Functional mobility training;Therapeutic activities;Therapeutic exercise;Balance training;Patient/family education   PT Goals Acute Rehab PT Goals PT Goal Formulation: With patient Time For Goal Achievement: 02/14/13 Potential to Achieve Goals: Good Pt will Roll Supine to Right Side: with modified independence Pt will go Supine/Side to Sit: with modified independence PT Goal: Supine/Side to Sit - Progress: Goal set today Pt will go Sit to Supine/Side: with modified independence PT Goal: Sit to Supine/Side - Progress: Goal set today Pt will go Sit to Stand: with modified independence PT Goal: Sit to Stand - Progress: Goal set today Pt will go Stand to Sit: with modified independence PT Goal: Stand to Sit - Progress: Goal set today Pt will Ambulate: 51 - 150 feet;with least restrictive assistive device;with modified independence PT Goal: Ambulate - Progress: Goal set today Pt will Go Up / Down Stairs: 1-2 stairs;with min assist PT Goal: Up/Down Stairs - Progress: Goal set today  Visit Information  Last PT Received On: 02/07/13 Assistance Needed: +1    Subjective Data  Subjective: I was up earlier Patient Stated Goal: home   Prior Functioning  Home Living Lives With: Alone Available Help at Discharge: Family Type of Home: House Home Access: Stairs to enter [employer] of Steps: 1 small Home Layout: Two level;Able to live on main level with bedroom/bathroom Home Adaptive Equipment: Straight cane;Bedside commode/3-in-1;Walker - standard;Walker - rolling;Shower chair with back Prior Function Level of Independence: Independent Able to Take Stairs?: Yes Driving: Yes Comments: working on disability Communication Communication: No difficulties    Cognition  Cognition Overall  Cognitive  Status: Appears within functional limits for tasks assessed/performed Arousal/Alertness: Awake/alert Behavior During Session: [facility] for tasks performed    Extremity/Trunk Assessment Right Upper Extremity Assessment RUE ROM/Strength/Tone: [facility] for tasks assessed Left Upper Extremity Assessment LUE ROM/Strength/Tone: [facility] for tasks assessed Right Lower Extremity Assessment RLE ROM/Strength/Tone: [facility] for tasks assessed Left Lower Extremity Assessment LLE ROM/Strength/Tone: WFL for tasks assessed   Balance    End of Session PT - End of Session Equipment Utilized During Treatment: Back brace Activity Tolerance: Patient tolerated treatment well Patient left: in bed;with call bell/phone within reach;with family/visitor present Nurse Communication: Mobility status  GP     [facility] 02/07/2013, 10:18 AM

## 2013-02-07 NOTE — Progress Notes (Signed)
Subjective: Patient reports She's feeling well no leg pain no new numbness or tingling her back is very sore but it's managed in the  pain regimen.  Objective: Vital signs in last 24 hours: Temp:  [97.7 F (36.5 C)-99 F (37.2 C)] 98 F (36.7 C) (03/11 0606) Pulse Rate:  [74-105] 74 (03/11 0606) Resp:  [12-25] 18 (03/11 0606) BP: (109-152)/(52-100) 135/72 mmHg (03/11 0606) SpO2:  [91 %-100 %] 100 % (03/11 0606) Weight:  [94.802 kg (209 lb)] 94.802 kg (209 lb) (03/10 1606)  Intake/Output from previous day: 03/10 0701 - 03/11 0700 In: 2363 [P.O.:360; I.V.:2003] Out: 1880 [Urine:1255; Drains:325; Blood:300] Intake/Output this shift:    Patient is awake alert strength is 5 out of 5 wound is clean and dry  Lab Results: No results found for this basename: WBC, HGB, HCT, PLT,  in the last 72 hours BMET No results found for this basename: NA, K, CL, CO2, GLUCOSE, BUN, CREATININE, CALCIUM,  in the last 72 hours  Studies/Results: Dg C-arm 1-60 Min  02/06/2013  C-arm imaging was used for interbody and posterior fusion at L4-5.  Fluoroscopy time:  39 seconds.   Original Report Authenticated By: Francene Boyers, M.D.     Assessment/Plan: Progressive mobilization today she has been ambulating work with physical therapy her catheter has been discontinued  LOS: 1 day     CRAM,GARY P 02/07/2013, 7:30 AM

## 2013-02-08 LAB — BASIC METABOLIC PANEL
CO2: 29 mEq/L (ref 19–32)
Calcium: 8.5 mg/dL (ref 8.4–10.5)
GFR calc non Af Amer: 87 mL/min — ABNORMAL LOW (ref >90)
Sodium: 144 mEq/L (ref 135–145)

## 2013-02-08 NOTE — Progress Notes (Signed)
Around 3:30am resident showed me a small amount of dark red blood with mucus on a tissue. Dr. Phoebe Perch made aware with no new orders. Encouraged to use incentive spirometer with teachback

## 2013-02-08 NOTE — Progress Notes (Signed)
Subjective: Patient reports She's doing okay she feels little more pain today mostly in her back no leg pain  Objective: Vital signs in last 24 hours: Temp:  [97.8 F (36.6 C)-98.3 F (36.8 C)] 98.3 F (36.8 C) (03/12 0936) Pulse Rate:  [65-83] 83 (03/12 0936) Resp:  [18] 18 (03/12 0936) BP: (113-130)/(46-54) 119/54 mmHg (03/12 0936) SpO2:  [95 %-98 %] 98 % (03/12 0936)  Intake/Output from previous day: 03/11 0701 - 03/12 0700 In: -  Out: 100 [Drains:100] Intake/Output this shift:    Strength out of 5 wound is clean and dry  Lab Results: No results found for this basename: WBC, HGB, HCT, PLT,  in the last 72 hours BMET  Recent Labs  02/08/13 0720  NA 144  K 3.7  CL 106  CO2 29  GLUCOSE 92  BUN 13  CREATININE 0.78  CALCIUM 8.5    Studies/Results: Dg C-arm 1-60 Min  02/06/2013  C-arm imaging was used for interbody and posterior fusion at L4-5.  Fluoroscopy time:  39 seconds.   Original Report Authenticated By: Francene Boyers, M.D.     Assessment/Plan: Continue to mobilize physical outpatient therapy I discontinued her Hemovac drain we'll plan discharge tomorrow  LOS: 2 days     CRAM,GARY P 02/08/2013, 10:43 AM

## 2013-02-08 NOTE — Progress Notes (Signed)
  Physical Therapy Treatment Patient Details Name: [name] [name] MRN: [id number] DOB: [birth year] Today's Date: 02/08/2013 Time: 1211-1226 PT Time Calculation (min): 15 min  PT Assessment / Plan / Recommendation Comments on Treatment Session  Pt progressing well towards all goals. anticipate pt to be able to return home with hired PCA and family support    Follow Up Recommendations  Home health PT;Supervision/Assistance - 24 hour     Does the patient have the potential to tolerate intense rehabilitation     Barriers to Discharge        Equipment Recommendations       Recommendations for Other Services    Frequency Min 5X/week   Plan Discharge plan remains appropriate;Frequency needs to be updated    Precautions / Restrictions Precautions Precautions: Back Precaution Booklet Issued: Yes (comment) Precaution Comments: needed reviewed-went over Required Braces or Orthoses: Spinal Brace Spinal Brace: Lumbar corset Restrictions Weight Bearing Restrictions: No   Pertinent Vitals/Pain 7/10 surgical back pain    Mobility  Bed Mobility Bed Mobility: Not assessed (but went over getting in/out of bed via logroll) Transfers Transfers: Stand to Sit Stand to Sit: 5: Supervision;With upper extremity assist;With armrests;To chair/3-in-1 Details for Transfer Assistance: v/c's for safe walker management and safe hand placement to prevent twisting Ambulation/Gait Ambulation/Gait Assistance: 4: Min guard Ambulation Distance (Feet): 150 Feet Assistive device: Rolling walker Ambulation/Gait Assistance Details: good technique Gait Pattern: Step-through pattern;Within Functional Limits Gait velocity: wfl Stairs: Yes Stairs Assistance: 4: Min guard Stair Management Technique: No rails;With walker Number of Stairs: 1 (platform step) Wheelchair Mobility Wheelchair Mobility: No    Exercises     PT Diagnosis:    PT Problem List:   PT Treatment Interventions:     PT Goals Acute  Rehab PT Goals PT Goal: Stand to Sit - Progress: Progressing toward goal PT Goal: Ambulate - Progress: Progressing toward goal PT Goal: Up/Down Stairs - Progress: Progressing toward goal Additional Goals Additional Goal #1: Pt independent with recall of 3/3 back precautions and 100% compliant PT Goal: Additional Goal #1 - Progress: Goal set today  Visit Information  Last PT Received On: 02/08/13 Assistance Needed: +1    Subjective Data  Subjective: Pt received walking in hallway with RN with RW.   Cognition  Cognition Overall Cognitive Status: Appears within functional limits for tasks assessed/performed Arousal/Alertness: Awake/alert Orientation Level: Oriented X4 / Intact Behavior During Session: WFL for tasks performed    Balance     End of Session PT - End of Session Equipment Utilized During Treatment: Back brace Activity Tolerance: Patient tolerated treatment well Patient left: in chair;with call bell/phone within reach Nurse Communication: Mobility status   GP     Marcene Brawn 02/08/2013, 12:53 PM  Lewis Shock, PT, DPT Pager #: [phone] Office #: [phone]

## 2013-02-08 NOTE — Progress Notes (Signed)
Ambulated in hallway from room to window and back. Hemovac emptied 125cc of bloody drainage

## 2013-02-09 ENCOUNTER — Inpatient Hospital Stay (HOSPITAL_COMMUNITY): Payer: 59

## 2013-02-09 LAB — TROPONIN I
Troponin I: <0.3 ng/mL (ref <0.30)
Troponin I: <0.3 ng/mL (ref <0.30)

## 2013-02-09 MED ORDER — ALUM & MAG HYDROXIDE-SIMETH 200-200-20 MG/5ML PO SUSP
30.0000 mL | Freq: Four times a day (QID) | ORAL | Status: DC | PRN
  Administered 2013-02-09: 30 mL via ORAL
  Filled 2013-02-09: qty 30

## 2013-02-09 MED ORDER — ASPIRIN 325 MG PO TABS
325.0000 mg | ORAL_TABLET | Freq: Once | ORAL | Status: AC
  Administered 2013-02-09: 325 mg via ORAL
  Filled 2013-02-09: qty 1

## 2013-02-09 MED ORDER — METOPROLOL TARTRATE 25 MG PO TABS
25.0000 mg | ORAL_TABLET | Freq: Once | ORAL | Status: DC
  Filled 2013-02-09: qty 1

## 2013-02-09 MED ORDER — NITROGLYCERIN 0.4 MG SL SUBL
0.4000 mg | SUBLINGUAL_TABLET | SUBLINGUAL | Status: DC | PRN
  Administered 2013-02-09 (×3): 0.4 mg via SUBLINGUAL
  Filled 2013-02-09: qty 25

## 2013-02-09 MED ORDER — POLYETHYLENE GLYCOL 3350 17 G PO PACK
17.0000 g | PACK | Freq: Every day | ORAL | Status: DC
  Administered 2013-02-09: 17 g via ORAL
  Filled 2013-02-09 (×2): qty 1

## 2013-02-09 MED ORDER — ALUM & MAG HYDROXIDE-SIMETH 200-200-20 MG/5ML PO SUSP
30.0000 mL | Freq: Once | ORAL | Status: AC
  Administered 2013-02-09: 30 mL via ORAL
  Filled 2013-02-09: qty 30

## 2013-02-09 NOTE — Progress Notes (Signed)
Contacted lab to drew blood for Troponin level check. Lab mentioned they cannot see the order, so reordered the order again.

## 2013-02-09 NOTE — Progress Notes (Signed)
Occupational Therapy Treatment Patient Details Name: [name] [name] MRN: [id number] DOB: 08/14/1951 Today's Date: 02/09/2013 Time: 4098-1191 OT Time Calculation (min): 42 min  OT Assessment / Plan / Recommendation Comments on Treatment Session Making good progress and will be appropriate for D/C tomorrow. Will benefit from  continued OT HH. Rec HHaid as well. all further OT to be addressed by [facility] services.    Follow Up Recommendations  Home health OT;Other (comment) (HH aide)    Barriers to Discharge   none    Equipment Recommendations  None recommended by OT    Recommendations for Other Services  none  Frequency Min 2X/week   Plan Discharge plan remains appropriate    Precautions / Restrictions Precautions Precautions: Back Precaution Comments: needed reviewed-went over Required Braces or Orthoses: Spinal Brace Spinal Brace: Lumbar corset Restrictions Weight Bearing Restrictions: No   Pertinent Vitals/Pain 7/10 back. nsg notified. Pain meds given. C/o nausea. meds given.     ADL  Lower Body Dressing: Supervision/safety;Set up Where Assessed - Lower Body Dressing: Unsupported sitting Toilet Transfer: Supervision/safety Toilet Transfer Method: Other (comment) (ambulating) Toilet Transfer Equipment: Comfort height toilet Toileting - Clothing Manipulation and Hygiene:  (educated pt on use of tongs and flushable wipes) Where Assessed - Toileting Clothing Manipulation and Hygiene: Sit to stand from 3-in-1 or toilet Equipment Used: Sock aid Transfers/Ambulation Related to ADLs: S ADL Comments: Educated pt on use of tongs/wipes for toileting and LBbathing. Pt verbalized back precautions. Appreared anxious about D/C home without 24 hr A. Reaasured pt    OT Diagnosis:    OT Problem List:   OT Treatment Interventions:     OT Goals Acute Rehab OT Goals OT Goal Formulation: With patient Time For Goal Achievement: 02/14/13 Potential to Achieve Goals: Good ADL Goals Pt  Will Perform Lower Body Bathing: with modified independence;Sit to stand from bed;with adaptive equipment ADL Goal: Lower Body Bathing - Progress: Progressing toward goals Pt Will Perform Lower Body Dressing: with modified independence;Sit to stand from chair;with adaptive equipment ADL Goal: Lower Body Dressing - Progress: Progressing toward goals Pt Will Transfer to Toilet: with modified independence;with DME;Regular height toilet;Maintaining back safety precautions ADL Goal: Toilet Transfer - Progress: Progressing toward goals Pt Will Perform Toileting - Clothing Manipulation: with modified independence;Sitting on 3-in-1 or toilet;Standing ADL Goal: Toileting - Clothing Manipulation - Progress: Progressing toward goals Pt Will Perform Toileting - Hygiene: with modified independence;Sit to stand from 3-in-1/toilet;with adaptive equipment ADL Goal: Toileting - Hygiene - Progress: Progressing toward goals Pt Will Perform Tub/Shower Transfer: Shower transfer;with supervision;with DME;Ambulation Miscellaneous OT Goals Miscellaneous OT Goal #1: Pt will recall and state 3/3 back precautions independently. OT Goal: Miscellaneous Goal #1 - Progress: Met  Visit Information  Last OT Received On: 02/09/13 Assistance Needed: +1    Subjective Data   I'm nervous going home by myself   Prior Functioning       Cognition    [facility]   Mobility  Bed Mobility Bed Mobility: Rolling Right;Right Sidelying to Sit;Sit to Sidelying Right;Scooting to [facility] Rolling Right: 5: Supervision Rolling Left: 5: Supervision Right Sidelying to Sit: 5: Supervision Left Sidelying to Sit: 5: Supervision;HOB flat Sitting - Scoot to Edge of Bed: 5: Supervision Details for Bed Mobility Assistance: vc to remind pt of back precautions Transfers Transfers: Sit to Stand Sit to Stand: 5: Supervision;With upper extremity assist;From bed Stand to Sit: 5: Supervision;With upper extremity assist;To toilet Details for Transfer  Assistance: cues to not pull up on walker  Exercises      Balance  WFL   End of Session OT - End of Session Activity Tolerance: Patient tolerated treatment well Patient left: Other (comment) (toilet) Nurse Communication: Mobility status;Patient requests pain meds  GO     WARD,HILLARY 02/09/2013, 2:50 PM [facility], OTR/L  [phone] 02/09/2013

## 2013-02-09 NOTE — Progress Notes (Signed)
Pt c/o chest pain. Reports heaviness on her sternal area of chest. Pt vs wnl with oxygen sat at 100% on RA. Pt placed on 2L oxygen. MD notified and at pt bedside; new orders received and completed. Pt alert and oriented; reports feeling much better now and pain has gone down. Will continue to monitor pt quietly. Venita Lick RN

## 2013-02-09 NOTE — Progress Notes (Signed)
Subjective: Patient reports She feels much better pain to settle down she is eating breakfast no nausea no vomiting leg pain is nonexistent back pain is well controlled  Objective: Vital signs in last 24 hours: Temp:  [97.9 F (36.6 C)-98.8 F (37.1 C)] 98.6 F (37 C) (03/13 1610) Pulse Rate:  [71-90] 90 (03/13 0638) Resp:  [18-20] 20 (03/13 0638) BP: (93-150)/(47-76) 113/53 mmHg (03/13 0638) SpO2:  [98 %-100 %] 98 % (03/13 0638)  Intake/Output from previous day:   Intake/Output this shift:    Strength is 5 out of 5 wound is clean and dry  Lab Results: No results found for this basename: WBC, HGB, HCT, PLT,  in the last 72 hours BMET  Recent Labs  02/08/13 0720  NA 144  K 3.7  CL 106  CO2 29  GLUCOSE 92  BUN 13  CREATININE 0.78  CALCIUM 8.5    Studies/Results: Dg Chest Port 1 View  02/09/2013  *RADIOLOGY REPORT*  Clinical Data: Chest pain  PORTABLE CHEST - 1 VIEW  Comparison: 01/26/2013  Findings: Heart size upper normal to mildly enlarged.  Mild lingular opacity superimposed on prominent epicardial fat.  No definite pleural effusion or pneumothorax.  No acute osseous finding.  IMPRESSION: Heart size upper normal to mildly enlarged.  Mild lingular opacity, favor atelectasis.   Original Report Authenticated By: Jearld Lesch, M.D.     Assessment/Plan: She presented because the episode of chest tightness this morning I think this is more related to reflux she responded Maalox none of her enzymes have shown any bump consistent with cardiac origin of this and her EKG was normal a repeat of his enzymes later will watch additional 24 hours problem discharge tomorrow  LOS: 3 days     Grady Mohabir P 02/09/2013, 7:52 AM

## 2013-02-09 NOTE — Progress Notes (Signed)
Physical Therapy Treatment Patient Details Name: [name] [name] MRN: [id number] DOB: 02-Sep-1951 Today's Date: 02/09/2013 Time: 0850-     PT Assessment / Plan / Recommendation Comments on Treatment Session  pt rpesents with L4-5 Lami and Fusion.  pt moving well today.  Feel pt is ready for D/C from PT stand point.      Follow Up Recommendations  Home health PT;Supervision/Assistance - 24 hour     Does the patient have the potential to tolerate intense rehabilitation     Barriers to Discharge        Equipment Recommendations  None recommended by PT    Recommendations for Other Services    Frequency Min 5X/week   Plan Discharge plan remains appropriate;Frequency remains appropriate    Precautions / Restrictions Precautions Precautions: Back Precaution Comments: needed reviewed-went over Required Braces or Orthoses: Spinal Brace Spinal Brace: Lumbar corset Restrictions Weight Bearing Restrictions: No   Pertinent Vitals/Pain Pt did not rate, but indicates she has already had pain meds this morning.      Mobility  Bed Mobility Bed Mobility: Rolling Right;Right Sidelying to Sit;Sitting - Scoot to Edge of Bed Rolling Right: 5: Supervision Right Sidelying to Sit: 5: Supervision Sitting - Scoot to Edge of Bed: 5: Supervision Details for Bed Mobility Assistance: cues for technique.  pt needed increased time to complete.   Transfers Transfers: Sit to Stand;Stand to Sit Sit to Stand: 5: Supervision;With upper extremity assist;From bed;From toilet Stand to Sit: 5: Supervision;With upper extremity assist;To toilet;To chair/3-in-1;With armrests Details for Transfer Assistance: cues only to A with pt locating armrest with hands.   Ambulation/Gait Ambulation/Gait Assistance: 5: Supervision Ambulation Distance (Feet): 300 Feet Assistive device: Rolling walker Ambulation/Gait Assistance Details: demos good technique.   Gait Pattern: Step-to pattern;Decreased stride  length Stairs: Yes Stairs Assistance: 4: Min guard Stair Management Technique: No rails;With walker Number of Stairs: 1 (x2) Wheelchair Mobility Wheelchair Mobility: No    Exercises     PT Diagnosis:    PT Problem List:   PT Treatment Interventions:     PT Goals Acute Rehab PT Goals Time For Goal Achievement: 02/14/13 Potential to Achieve Goals: Good PT Goal: Rolling Supine to Right Side - Progress: Progressing toward goal PT Goal: Supine/Side to Sit - Progress: Progressing toward goal PT Goal: Sit to Stand - Progress: Progressing toward goal PT Goal: Stand to Sit - Progress: Progressing toward goal PT Goal: Ambulate - Progress: Progressing toward goal PT Goal: Up/Down Stairs - Progress: Met Additional Goals PT Goal: Additional Goal #1 - Progress: Progressing toward goal  Visit Information  Last PT Received On: 02/09/13 Assistance Needed: +1    Subjective Data  Subjective: I got up and walked some earlier too.     Cognition  Cognition Arousal/Alertness: Awake/alert Orientation Level: Oriented X4 / Intact Behavior During Session: WFL for tasks performed    Balance  Balance Balance Assessed: No  End of Session PT - End of Session Equipment Utilized During Treatment: Gait belt;Back brace Activity Tolerance: Patient tolerated treatment well Patient left: in chair;with call bell/phone within reach Nurse Communication: Mobility status   GP     Sunny Schlein, [place] [phone] 02/09/2013, 9:28 AM

## 2013-02-09 NOTE — Progress Notes (Signed)
Patient ID: [name] [name], female   DOB: 30-Jun-1951, 62 y.o.   MRN: [id number] Called to see patient secondary to feeling some chest pain described as pressure on the substernal area. She says this is been with impending going on for approximately one hour he was associated with an uncomfortable feeling in her stomach and she describes more like reflux and actual nausea he did the did not radiate into her jaw or her or she denies any shortness of breath. It's currently about 40-50% better responding to both the long head or Maalox unclear which one had a greater affect. Vital signs were stable with a blood pressure systolic 140/70 pulse is was 80 saturations were 100% room air. She was placed on 2 L of nasal cannula O2 she was given 0.5 Dilaudid and aspirin EKG was ordered blood work was ordered these are still pending..  Assessment plan: The substernal chest pain more likely consistent with reflux then cardiogenic. However will await the results of her EKG we'll also order a chest x-ray and her blood work with cardiac isoenzymes will put her on Maalox around the clock and continue to monitor.

## 2013-02-10 MED ORDER — OXYCODONE-ACETAMINOPHEN 5-325 MG PO TABS: 1.0000 | ORAL_TABLET | ORAL | Status: DC | PRN

## 2013-02-10 MED ORDER — CYCLOBENZAPRINE HCL 10 MG PO TABS: 10.0000 mg | ORAL_TABLET | Freq: Three times a day (TID) | ORAL | Status: DC | PRN

## 2013-02-10 NOTE — Discharge Summary (Signed)
Physician Discharge Summary  Patient ID: MARKERIA GOETSCH MRN: [id number] DOB/AGE: Jun 01, 1951 62 y.o.  Admit date: 02/06/2013 Discharge date: 02/10/2013  Admission Diagnoses: Grade 1 spondylolisthesis and lumbar spinal stenosis L4-5  Discharge Diagnoses: Same Active Problems:   * No active hospital problems. *   Discharged Condition: good  Hospital Course: Patient was in to the hospital underwent a decompressive laminectomy and fusion postoperatively patient did very well recovered in the floor on the floor she was convalescing well ambulating and voiding spontaneously tolerating regular diet difficulty days to get her pain under well enough control and oral analgesics as well as her to mobilize adequately with physical therapy. She did have one episode of chest pain early one morning however this was felt to be more related to gastroesophageal reflux disease as the cardiac workup him up negative with a normal chest x-ray normal EKG and negative enzymes. She responded to Maalox. She'll followup in approximately 2 weeks  Consults: Significant Diagnostic Studies: Treatments: L4-5 decompression and fusion Discharge Exam: Blood pressure 141/65, pulse 74, temperature 98 F (36.7 C), temperature source Oral, resp. rate 18, height 5\' 1"  (1.549 m), weight 94.802 kg (209 lb), SpO2 99.00%. Strength out of 5 wound clean and dry  Disposition: Home     Medication List    TAKE these medications       ALPRAZolam 0.5 MG tablet  Commonly known as:  XANAX  Take 0.25-0.5 mg by mouth 3 (three) times daily as needed for anxiety.     ARIPiprazole 2 MG tablet  Commonly known as:  ABILIFY  Take 2 mg by mouth daily.     ascorbic acid 1000 MG tablet  Commonly known as:  VITAMIN C  Take 1,000 mg by mouth daily.     atorvastatin 10 MG tablet  Commonly known as:  LIPITOR  Take 10 mg by mouth daily.     bisacodyl 5 MG EC tablet  Commonly known as:  DULCOLAX  Take 5 mg by mouth daily as  needed for constipation.     cloNIDine 0.1 MG tablet  Commonly known as:  CATAPRES  Take 0.1 mg by mouth 2 (two) times daily.     cyclobenzaprine 10 MG tablet  Commonly known as:  FLEXERIL  Take 1 tablet (10 mg total) by mouth 3 (three) times daily as needed for muscle spasms.     DULoxetine 60 MG capsule  Commonly known as:  CYMBALTA  Take 60 mg by mouth 2 (two) times daily.     fish oil-omega-3 fatty acids 1000 MG capsule  Take 1 g by mouth 2 (two) times daily.     furosemide 20 MG tablet  Commonly known as:  LASIX  Take 20-40 mg by mouth 2 (two) times daily. Take 2 tablets in the AM and 1 tablet in the PM     HYDROcodone-acetaminophen 10-325 MG per tablet  Commonly known as:  NORCO  Take 0.5-1 tablets by mouth every 8 (eight) hours as needed for pain.     hydrOXYzine 25 MG tablet  Commonly known as:  ATARAX/VISTARIL  Take 25 mg by mouth 3 (three) times daily as needed for itching.     metoprolol succinate 25 MG 24 hr tablet  Commonly known as:  TOPROL-XL  Take 25 mg by mouth daily.     multivitamin with minerals Tabs  Take 1 tablet by mouth 2 (two) times daily.     oxyCODONE-acetaminophen 5-325 MG per tablet  Commonly known as:  PERCOCET/ROXICET  Take 1-2 tablets by mouth every 4 (four) hours as needed.     phentermine 37.5 MG capsule  Take 37.5 mg by mouth 2 (two) times daily.     promethazine 25 MG tablet  Commonly known as:  PHENERGAN  Take 25 mg by mouth every 6 (six) hours as needed for nausea.     VITAMIN D PO  Take 2,000 Units by mouth daily.     zolpidem 10 MG tablet  Commonly known as:  AMBIEN  Take 10 mg by mouth at bedtime as needed for sleep.           Follow-up Information   Follow up with [facility] . (Home Health Physical Therapy)    Contact information:   [phone]      Follow up with CRAM,GARY P, MD.   Contact information:   [address] [place] [address] [phone]       Signed: CRAM,GARY  P 02/10/2013, 7:22 AM

## 2013-02-10 NOTE — Progress Notes (Signed)
Patient ID: [name] [name], female   DOB: 04-11-1951, 62 y.o.   MRN: [id number]  patient is doing well having some anterior quad pain but overall her preoperative . Strength is 5 out of 5 wound is clean and dry we'll discharge homeradicular pain is still completely resolved back pain is managed the medication

## 2013-02-23 ENCOUNTER — Emergency Department (HOSPITAL_COMMUNITY)
Admission: EM | Admit: 2013-02-23 | Discharge: 2013-02-23 | Disposition: A | Discharge status: Home / Self Care | Payer: 59 | Attending: Emergency Medicine | Admitting: Emergency Medicine

## 2013-02-23 ENCOUNTER — Encounter (HOSPITAL_COMMUNITY): Payer: Self-pay | Admitting: *Deleted

## 2013-02-23 DIAGNOSIS — I10 Essential (primary) hypertension: Secondary | ICD-10-CM | POA: Insufficient documentation

## 2013-02-23 DIAGNOSIS — Z8719 Personal history of other diseases of the digestive system: Secondary | ICD-10-CM | POA: Insufficient documentation

## 2013-02-23 DIAGNOSIS — Z79899 Other long term (current) drug therapy: Secondary | ICD-10-CM | POA: Insufficient documentation

## 2013-02-23 DIAGNOSIS — F329 Major depressive disorder, single episode, unspecified: Secondary | ICD-10-CM | POA: Insufficient documentation

## 2013-02-23 DIAGNOSIS — Z8739 Personal history of other diseases of the musculoskeletal system and connective tissue: Secondary | ICD-10-CM | POA: Insufficient documentation

## 2013-02-23 DIAGNOSIS — R21 Rash and other nonspecific skin eruption: Secondary | ICD-10-CM | POA: Insufficient documentation

## 2013-02-23 DIAGNOSIS — F3289 Other specified depressive episodes: Secondary | ICD-10-CM | POA: Insufficient documentation

## 2013-02-23 DIAGNOSIS — Z872 Personal history of diseases of the skin and subcutaneous tissue: Secondary | ICD-10-CM | POA: Insufficient documentation

## 2013-02-23 DIAGNOSIS — F411 Generalized anxiety disorder: Secondary | ICD-10-CM | POA: Insufficient documentation

## 2013-02-23 LAB — APTT: aPTT: 31 seconds (ref 24–37)

## 2013-02-23 LAB — CBC WITH DIFFERENTIAL/PLATELET
Basophils Absolute: 0 10*3/uL (ref 0.0–0.1)
Basophils Relative: 0 % (ref 0–1)
HCT: 37.4 % (ref 36.0–46.0)
MCHC: 32.1 g/dL (ref 30.0–36.0)
Monocytes Absolute: 0.5 10*3/uL (ref 0.1–1.0)
Neutro Abs: 3.9 10*3/uL (ref 1.7–7.7)
RDW: 14.3 % (ref 11.5–15.5)

## 2013-02-23 NOTE — ED Notes (Signed)
Donalee Citrin MD was surgeon for lumbar surgery.  Pt PCP is in [place] and told pt to come here bc surgery was done here by one of [facility] MD.  Pt says MD was concerned that she is having a reaction to the hardware in her back or that she may be septic from IV since she has rash on both hands and arms and she had IV's in both hands and forearms.

## 2013-02-23 NOTE — ED Provider Notes (Signed)
Medical screening examination/treatment/procedure(s) were performed by non-physician practitioner and as supervising physician I was immediately available for consultation/collaboration.  Anjolie Majer K Linker, MD 02/23/13 1721 

## 2013-02-23 NOTE — ED Notes (Addendum)
Pt reports rash on hands and forearms.  Pt had surgery on lower back 2 weeks ago and MD told her to come have rash checked since she has hardware in back.  Pt claims the rash itches sometimes but denies pain.  Pt also reports having cellulitis in past that progressed into sepsis.  Pt alert oriented X4.

## 2013-02-23 NOTE — ED Notes (Signed)
Patient is alert and orientedx4.  Patient was explained discharge instructions and they understood them with no questions.  The patient's son, Joanna Hews, is taking the patient home.

## 2013-02-23 NOTE — ED Notes (Signed)
Pt and family concerned about the amount of time it has taken for pt to be seen by an MD.  Pt and family advised that the MD is aware that they are waiting and will be seen as soon as MD id available.

## 2013-02-23 NOTE — ED Notes (Signed)
Pt to ED c/o rash to both forearms and hands.  Last summer she had cellulitis that turned into sepsis.  She had back surgery here on 3/10, so even though her pcp is in [place], the told her to come here b/c her surgery was here.

## 2013-02-23 NOTE — ED Provider Notes (Signed)
History  This chart was scribed for non-physician practitioner Dierdre Forth, PA-C working with Ethelda Chick, MD, by Candelaria Stagers, ED Scribe. This patient was seen in room TR08C/TR08C and the patient's care was started at 3:16 PM   CSN: [id number]  Arrival date & time 02/23/13  1325   First MD Initiated Contact with Patient 02/23/13 1508      Chief Complaint  Patient presents with  . Rash     The history is provided by the patient. No language interpreter was used.   [name] [name] is a 62 y.o. female who presents to the Emergency Department complaining of rash to bilateral forearms that started 1-2 days ago with no changes.  Pt reports she has h/o cellulitis with sepsis one year ago.  She reports she had spinal fusion surgery about two weeks ago and was at a follow up appointment with the physician who performed the surgery, Dr. Wynetta Emery, earlier today who visualized the rash and advised pt to come to the ED for concern of cellulitis.  Pt reports the rash has now subsided.  She denies any changes to detergents, lotions, or soaps.  Pt denies fever, chills, nausea, vomiting, headache, vision changes, or trouble walking or talking.  She has had no complications following the back surgery and no changes to back pain.  Pt does not take a blood thinner.    Dr. Wynetta Emery performed the spinal fusion.   Past Medical History  Diagnosis Date  . Hypertension   . Depression   . Anxiety   . H/O hiatal hernia   . Arthritis   . Cellulitis and abscess of leg     last yr    Past Surgical History  Procedure Laterality Date  . Cesarean section    . Hernia repair    . Abdominal hysterectomy    . Gastric bypass    . Joint replacement      bil knee  . Tonsillectomy    . Back surgery      02/06/2013    No family history on file.  History  Substance Use Topics  . Smoking status: Never Smoker   . Smokeless tobacco: Not on file  . Alcohol Use: Yes     Comment: occ    OB History    Grav Para Term Preterm Abortions TAB SAB Ect Mult Living                  Review of Systems  Constitutional: Negative for fever and chills.  Gastrointestinal: Negative for nausea and vomiting.  Skin: Positive for rash (bilateral forearm rash).  All other systems reviewed and are negative.    Allergies  Bee venom; Erythrocin; Fioricet; Other; Penicillins; Clindamycin/lincomycin; Morphine and related; Phenobarbital; Teflaro; and Vancomycin  Home Medications   Current Outpatient Rx  Name  Route  Sig  Dispense  Refill  . ALPRAZolam (XANAX) 0.5 MG tablet   Oral   Take 0.25-0.5 mg by mouth 3 (three) times daily as needed for anxiety.         . ARIPiprazole (ABILIFY) 2 MG tablet   Oral   Take 2 mg by mouth daily.         Marland Kitchen ascorbic acid (VITAMIN C) 1000 MG tablet   Oral   Take 1,000 mg by mouth daily.         Marland Kitchen atorvastatin (LIPITOR) 10 MG tablet   Oral   Take 10 mg by mouth daily.         Marland Kitchen  bisacodyl (DULCOLAX) 5 MG EC tablet   Oral   Take 5 mg by mouth daily as needed for constipation.         . Cholecalciferol (VITAMIN D PO)   Oral   Take 2,000 Units by mouth daily.         . cloNIDine (CATAPRES) 0.1 MG tablet   Oral   Take 0.1 mg by mouth 2 (two) times daily.         . cyclobenzaprine (FLEXERIL) 10 MG tablet   Oral   Take 10 mg by mouth 3 (three) times daily as needed for muscle spasms.         . DULoxetine (CYMBALTA) 60 MG capsule   Oral   Take 60 mg by mouth 2 (two) times daily.         . fish oil-omega-3 fatty acids 1000 MG capsule   Oral   Take 1 g by mouth 2 (two) times daily.         . furosemide (LASIX) 20 MG tablet   Oral   Take 20-40 mg by mouth 2 (two) times daily. Take 2 tablets in the AM and 1 tablet in the PM         . HYDROcodone-acetaminophen (NORCO) 10-325 MG per tablet   Oral   Take 0.5-1 tablets by mouth every 8 (eight) hours as needed for pain.         . hydrOXYzine (ATARAX/VISTARIL) 25 MG tablet   Oral    Take 25 mg by mouth 3 (three) times daily as needed for itching.         . metoprolol succinate (TOPROL-XL) 25 MG 24 hr tablet   Oral   Take 25 mg by mouth daily.         . Multiple Vitamin (MULTIVITAMIN WITH MINERALS) TABS   Oral   Take 1 tablet by mouth 2 (two) times daily.         Marland Kitchen oxyCODONE-acetaminophen (PERCOCET/ROXICET) 5-325 MG per tablet   Oral   Take 1 tablet by mouth every 6 (six) hours as needed for pain.         . phentermine 37.5 MG capsule   Oral   Take 37.5 mg by mouth 2 (two) times daily.         . promethazine (PHENERGAN) 25 MG tablet   Oral   Take 25 mg by mouth every 6 (six) hours as needed for nausea.         Marland Kitchen zolpidem (AMBIEN) 10 MG tablet   Oral   Take 10 mg by mouth at bedtime as needed for sleep.           BP 127/64  Pulse 74  Temp(Src) 98.3 F (36.8 C) (Oral)  Resp 18  SpO2 100%  Physical Exam  Nursing note and vitals reviewed. Constitutional: She is oriented to person, place, and time. She appears well-developed and well-nourished. No distress.  HENT:  Head: Normocephalic and atraumatic.  Mouth/Throat: Oropharynx is clear and moist. No oropharyngeal exudate.  Eyes: Conjunctivae are normal. Pupils are equal, round, and reactive to light. No scleral icterus.  Neck: Normal range of motion. Neck supple.  Cardiovascular: Normal rate, regular rhythm, normal heart sounds and intact distal pulses.  Exam reveals no gallop and no friction rub.   No murmur heard. Pulmonary/Chest: Effort normal and breath sounds normal. No respiratory distress. She has no wheezes.  Abdominal: Soft. Bowel sounds are normal. She exhibits no mass. There is no tenderness. There  is no rebound and no guarding.  Musculoskeletal: Normal range of motion. She exhibits no edema.  Neurological: She is alert and oriented to person, place, and time.  Speech is clear and goal oriented Moves extremities without ataxia  Skin: Skin is warm and dry. She is not  diaphoretic. No erythema.  Mild excoriations noted on the chest and arms nonblanching purple lesions noted on the wrist  No erythematous rash noted on the arms as described by the patient  Psychiatric: She has a normal mood and affect.    ED Course  Procedures  DIAGNOSTIC STUDIES: Oxygen Saturation is 100% on room air, normal by my interpretation.    COORDINATION OF CARE:  3:23 PM Discussed course of care with pt which includes consulting with Dr. Wynetta Emery.  Pt understands and agrees.   5:05 PM Consult with Dr. Wynetta Emery - He states that she has a Hx of cellulitis and that if her rash in non concerning for cellulites at this time she may be discharged home.    Results for orders placed during the hospital encounter of 02/23/13 (from the past 24 hour(s))  CBC WITH DIFFERENTIAL     Status: None   Collection Time    02/23/13  3:55 PM      Result Value Range   WBC 7.8  4.0 - 10.5 K/uL   RBC 4.60  3.87 - 5.11 MIL/uL   Hemoglobin 12.0  12.0 - 15.0 g/dL   HCT 16.1  09.6 - 04.5 %   MCV 81.3  78.0 - 100.0 fL   MCH 26.1  26.0 - 34.0 pg   MCHC 32.1  30.0 - 36.0 g/dL   RDW 40.9  81.1 - 91.4 %   Platelets 369  150 - 400 K/uL   Neutrophils Relative 51  43 - 77 %   Neutro Abs 3.9  1.7 - 7.7 K/uL   Lymphocytes Relative 37  12 - 46 %   Lymphs Abs 2.9  0.7 - 4.0 K/uL   Monocytes Relative 6  3 - 12 %   Monocytes Absolute 0.5  0.1 - 1.0 K/uL   Eosinophils Relative 5  0 - 5 %   Eosinophils Absolute 0.4  0.0 - 0.7 K/uL   Basophils Relative 0  0 - 1 %   Basophils Absolute 0.0  0.0 - 0.1 K/uL  PROTIME-INR     Status: None   Collection Time    02/23/13  3:55 PM      Result Value Range   Prothrombin Time 12.2  11.6 - 15.2 seconds   INR 0.91  0.00 - 1.49  APTT     Status: None   Collection Time    02/23/13  3:55 PM      Result Value Range   aPTT 31  24 - 37 seconds    Labs Reviewed  CBC WITH DIFFERENTIAL  PROTIME-INR  APTT   No results found.   1. Rash and nonspecific skin eruption        MDM  [name] [name] presents from Dr Dola Argyle office with rash on the bilateral arms and concern for cellulitis. On my exam the patient's rash has completely abated; however noted on her bilateral wrist and nonblanching lesions concerning for purpura versus vasculitis. Patient states it has been there for greater than one month and none of her physicians are concerned.  Discussed the patient with Dr. Wynetta Emery who states she may be discharged if she is no evidence of cellulitis.  Patient is  afebrile, non-tachycardic, neurologically intact, nontoxic, nonseptic appearing, NAD. Also discussed the patient with Dr. Jerelyn Scott who recommends basic blood work to ensure the patient does not have an ITP, TTP or other coagulation disorder at this time.  Pt labs unremarkable; she is without thrombocytopenia, leukopenia or coagulation abnormalities at this time. Patient concurs followup with her primary care physician concerning spots on her arm. She has also been encouraged to follow up back here in the emergency department if the erythematous rash on the bilateral upper extremity his return to her peers and rales on her body, chills a fever or has any other symptoms concerning for infection.  I have also discussed reasons to return immediately to the ER.  Patient expresses understanding and agrees with plan.  I personally performed the services described in this documentation, which was scribed in my presence. The recorded information has been reviewed and is accurate.   Dahlia Client Jestine Bicknell, PA-C 02/23/13 1705

## 2013-03-08 ENCOUNTER — Other Ambulatory Visit (HOSPITAL_COMMUNITY): Payer: Self-pay | Admitting: Neurosurgery

## 2013-03-08 DIAGNOSIS — M4310 Spondylolisthesis, site unspecified: Secondary | ICD-10-CM

## 2013-11-19 IMAGING — RF DG C-ARM 61-120 MIN
1 series · 3 of 3 positions shown · non-contrast
Comparison: none

[Series 1: run · 3 of 3 slices shown]
[im 1/3]
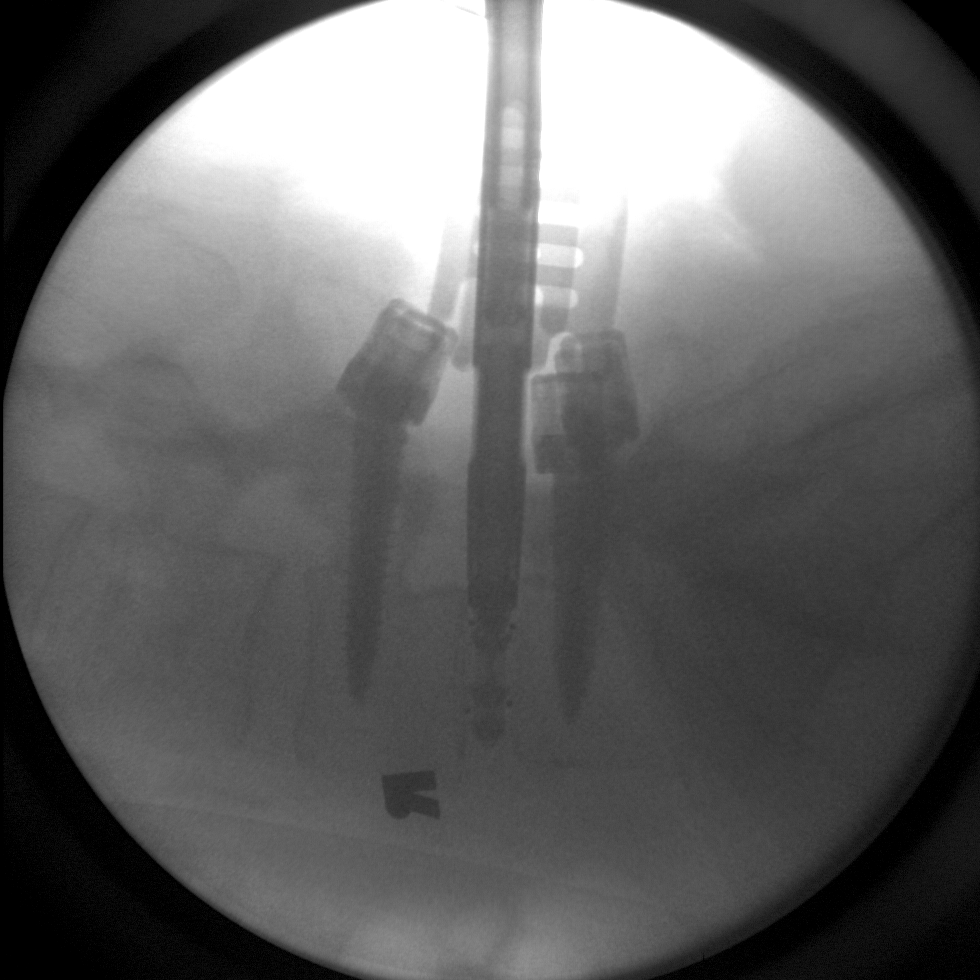
[im 2/3]
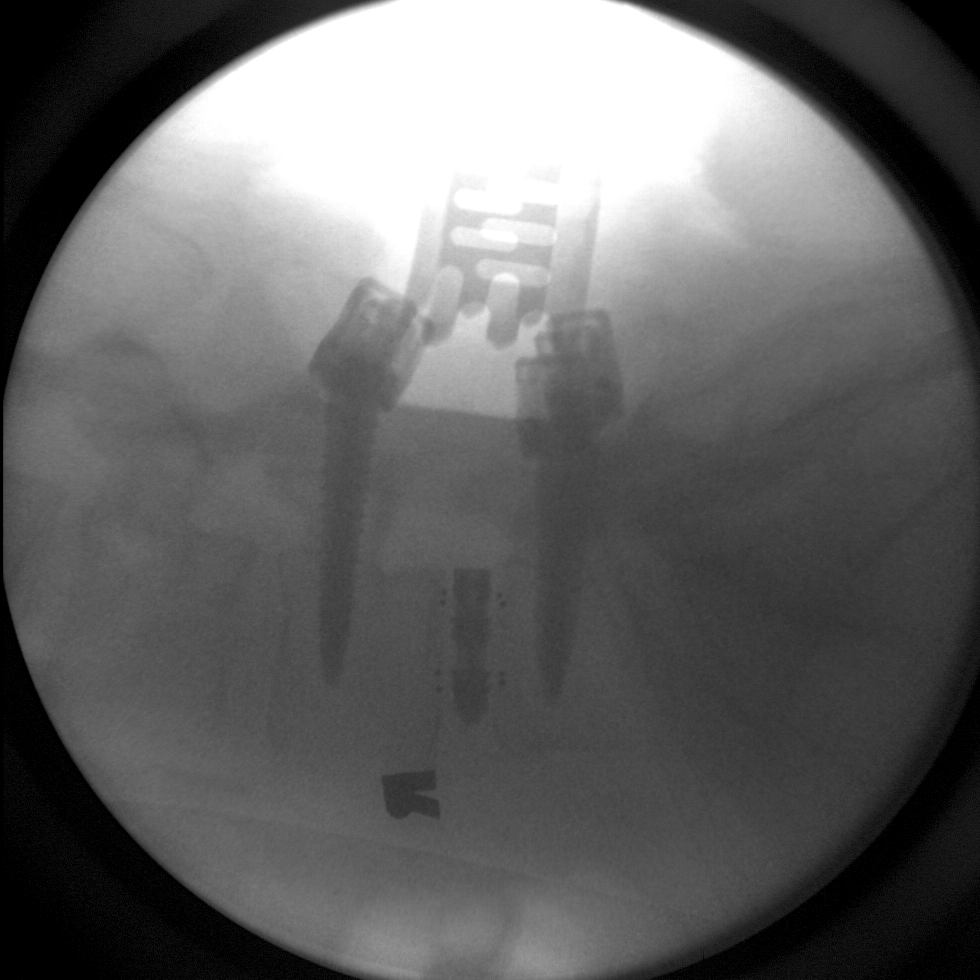
[im 3/3]
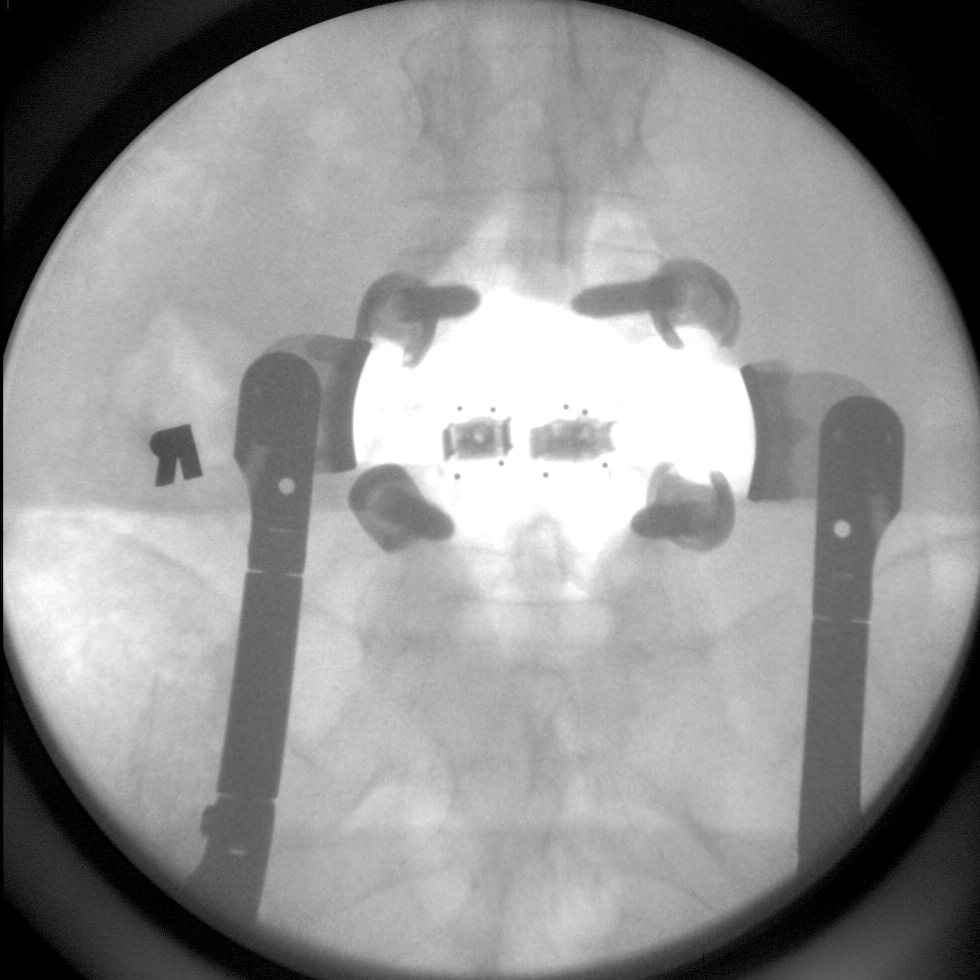

[3 of 3 positions shown; findings below may reference images not displayed]

C-arm imaging was used for interbody and posterior fusion at L4-5.

Fluoroscopy time:  39 seconds.

## 2014-05-03 ENCOUNTER — Ambulatory Visit (INDEPENDENT_AMBULATORY_CARE_PROVIDER_SITE_OTHER): Payer: Medicare Other | Admitting: Surgery

## 2014-05-03 ENCOUNTER — Encounter (INDEPENDENT_AMBULATORY_CARE_PROVIDER_SITE_OTHER): Payer: Self-pay | Admitting: Surgery

## 2014-05-03 ENCOUNTER — Telehealth (INDEPENDENT_AMBULATORY_CARE_PROVIDER_SITE_OTHER): Payer: Self-pay

## 2014-05-03 VITALS — BP 136/80 | HR 74 | Temp 97.7°F | Resp 16 | Ht 59.0 in | Wt 238.0 lb

## 2014-05-03 DIAGNOSIS — J4489 Other specified chronic obstructive pulmonary disease: Secondary | ICD-10-CM | POA: Insufficient documentation

## 2014-05-03 DIAGNOSIS — N6019 Diffuse cystic mastopathy of unspecified breast: Secondary | ICD-10-CM | POA: Insufficient documentation

## 2014-05-03 DIAGNOSIS — I10 Essential (primary) hypertension: Secondary | ICD-10-CM | POA: Insufficient documentation

## 2014-05-03 DIAGNOSIS — L03119 Cellulitis of unspecified part of limb: Secondary | ICD-10-CM

## 2014-05-03 DIAGNOSIS — Z01818 Encounter for other preprocedural examination: Secondary | ICD-10-CM

## 2014-05-03 DIAGNOSIS — N35919 Unspecified urethral stricture, male, unspecified site: Secondary | ICD-10-CM | POA: Insufficient documentation

## 2014-05-03 DIAGNOSIS — Z1231 Encounter for screening mammogram for malignant neoplasm of breast: Secondary | ICD-10-CM

## 2014-05-03 DIAGNOSIS — L02419 Cutaneous abscess of limb, unspecified: Secondary | ICD-10-CM

## 2014-05-03 DIAGNOSIS — Z9889 Other specified postprocedural states: Secondary | ICD-10-CM

## 2014-05-03 DIAGNOSIS — E7800 Pure hypercholesterolemia, unspecified: Secondary | ICD-10-CM

## 2014-05-03 DIAGNOSIS — Z9884 Bariatric surgery status: Secondary | ICD-10-CM

## 2014-05-03 DIAGNOSIS — J449 Chronic obstructive pulmonary disease, unspecified: Secondary | ICD-10-CM

## 2014-05-03 NOTE — Addendum Note (Signed)
Addended by: Ivor Costa on: 05/03/2014 10:46 AM   Modules accepted: Orders

## 2014-05-03 NOTE — Telephone Encounter (Signed)
Medical records request faxed to [facility] [phone] for OP Note, DC Summary and patient medical records from Bariatric Surgery by Dr. Evorn Gong.  Fax confirmation rec'd

## 2014-05-03 NOTE — Progress Notes (Signed)
Chief Complaint: Minigastric bypass with failure to lose weight with BMI of 47  History of Present Illness:  [name] [name] is an 63 y.o. female from [place] who has had a mini gastric bypass by Dr. Evorn Gong in 2006. 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 It is unclear from her description whether her to prior pelvic surgeries including a hysterectomy nephrectomy or C-sections and given her enough adhesions to where she could not be done. She also mention that she had a hernia repair. I would like to get a copy of her operative note from [facility].    Past Medical History  Diagnosis Date  . Hypertension   . Depression   . Anxiety   . H/O hiatal hernia   . Arthritis   . Cellulitis and abscess of leg     last yr    Past Surgical History  Procedure Laterality Date  . Cesarean section    . Hernia repair    . Abdominal hysterectomy    . Gastric bypass      minigastric bypass 2006 in [facility]  . Joint replacement      bil knee  . Tonsillectomy    . Back surgery      02/06/2013    Current Outpatient Prescriptions  Medication Sig Dispense Refill  . allopurinol (ZYLOPRIM) 100 MG tablet Take 100 mg by mouth daily.      Marland Kitchen ALPRAZolam (XANAX) 0.5 MG tablet Take 0.25-0.5 mg by mouth 3 (three) times daily as needed for anxiety.      . ARIPiprazole (ABILIFY) 2 MG tablet Take 2 mg by mouth daily.      Marland Kitchen ascorbic acid (VITAMIN C) 1000 MG tablet Take 1,000 mg by mouth daily.      Marland Kitchen atorvastatin (LIPITOR) 10 MG tablet Take 10 mg by mouth daily.      . bisacodyl (DULCOLAX) 5 MG EC tablet Take 5 mg by mouth daily as needed for constipation.      . Cholecalciferol (VITAMIN D PO) Take 2,000 Units by mouth daily.      . cloNIDine (CATAPRES) 0.1 MG tablet Take 0.1 mg by mouth 2 (two) times daily.      . cyclobenzaprine (FLEXERIL) 10 MG tablet Take 10 mg by mouth 3 (three) times  daily as needed for muscle spasms.      . DULoxetine (CYMBALTA) 60 MG capsule Take 60 mg by mouth 2 (two) times daily.      . fish oil-omega-3 fatty acids 1000 MG capsule Take 1 g by mouth 2 (two) times daily.      . furosemide (LASIX) 20 MG tablet Take 20-40 mg by mouth 2 (two) times daily. Take 2 tablets in the AM and 1 tablet in the PM      . HYDROcodone-acetaminophen (NORCO) 10-325 MG per tablet Take 0.5-1 tablets by mouth every 8 (eight) hours as needed for pain.      . hydrOXYzine (ATARAX/VISTARIL) 25 MG tablet Take 25 mg by mouth 3 (three) times daily as needed for itching.      . metoprolol succinate (TOPROL-XL) 25 MG 24 hr tablet Take 25 mg by mouth daily.      . Multiple Vitamin (MULTIVITAMIN WITH MINERALS) TABS Take 1 tablet by mouth 2 (two) times daily.      Marland Kitchen oxyCODONE-acetaminophen (PERCOCET/ROXICET) 5-325 MG per tablet Take 1  tablet by mouth every 6 (six) hours as needed for pain.      . phentermine 37.5 MG capsule Take 37.5 mg by mouth 2 (two) times daily.      . promethazine (PHENERGAN) 25 MG tablet Take 25 mg by mouth every 6 (six) hours as needed for nausea.      Marland Kitchen zolpidem (AMBIEN) 10 MG tablet Take 10 mg by mouth at bedtime as needed for sleep.       No current facility-administered medications for this visit.   Bee venom; Erythrocin; Fioricet; Other; Penicillins; Clindamycin/lincomycin; Morphine and related; Phenobarbital; Teflaro; and Vancomycin History reviewed. No pertinent family history. Social History:   reports that she has never smoked. She does not have any smokeless tobacco history on file. She reports that she drinks alcohol. She reports that she does not use illicit drugs.   REVIEW OF SYSTEMS - PERTINENT POSITIVES ONLY: Extensive past medical history  Physical Exam:   Blood pressure 136/80, pulse 74, temperature 97.7 F (36.5 C), resp. rate 16, height 4\' 11"  (1.499 m), weight 238 lb (107.956 kg). Body mass index is 48.04 kg/(m^2).  Gen:  WDWN WF NAD   Neurological: Alert and oriented to person, place, and time. Motor and sensory function is grossly intact  Head: Normocephalic and atraumatic.  Eyes: Conjunctivae are normal. Pupils are equal, round, and reactive to light. No scleral icterus.  Neck: Normal range of motion. Neck supple. No tracheal deviation or thyromegaly present.  Cardiovascular:  SR without murmurs or gallops.  No carotid bruits Respiratory: Effort normal.  No respiratory distress. No chest wall tenderness. Breath sounds normal.  No wheezes, rales or rhonchi.  Abdomen:  Multiple laparoscopic incisions GU: Musculoskeletal: Normal range of motion. Extremities are nontender. No cyanosis, edema or clubbing noted Lymphadenopathy: No cervical, preauricular, postauricular or axillary adenopathy is present Skin: Skin is warm and dry. No rash noted. No diaphoresis. No erythema. No pallor. Pscyh: Normal mood and affect. Behavior is normal. Judgment and thought content normal.   LABORATORY RESULTS: No results found for this or any previous visit (from the past 48 hour(s)).  RADIOLOGY RESULTS: No results found.  Problem List: Patient Active Problem List   Diagnosis Date Noted  . Unspecified essential hypertension 05/03/2014  . Minigastric bypass in [facility] 2006 05/03/2014  . Hypercholesteremia 05/03/2014  . Cellulitis and abscess of leg, except foot 05/03/2014  . Diffuse cystic mastopathy 05/03/2014  . COPD 05/03/2014  . Urethral stricture unspecified 05/03/2014  . Hx of decompressive lumbar laminectomy2014 [place] 05/03/2014    Assessment & Plan: Failure to lose weight with minigastric bypass.  Will workup to see if revisional conversion to gastric bypass roux Y is possible.      Matt B. Hassell Done, MD, [facility] [phone] beeper [phone]  05/03/2014 10:21 AM

## 2014-05-16 ENCOUNTER — Other Ambulatory Visit (INDEPENDENT_AMBULATORY_CARE_PROVIDER_SITE_OTHER): Payer: Self-pay | Admitting: Surgery

## 2014-05-16 LAB — CBC WITH DIFFERENTIAL/PLATELET
Basophils Absolute: 0 10*3/uL (ref 0.0–0.1)
Basophils Relative: 0 % (ref 0–1)
Eosinophils Absolute: 0.1 10*3/uL (ref 0.0–0.7)
Eosinophils Relative: 1 % (ref 0–5)
HCT: 41.6 % (ref 36.0–46.0)
HEMOGLOBIN: 14.1 g/dL (ref 12.0–15.0)
LYMPHS ABS: 4.6 10*3/uL — AB (ref 0.7–4.0)
LYMPHS PCT: 33 % (ref 12–46)
MCH: 27.8 pg (ref 26.0–34.0)
MCHC: 33.9 g/dL (ref 30.0–36.0)
MCV: 82.1 fL (ref 78.0–100.0)
MONOS PCT: 10 % (ref 3–12)
Monocytes Absolute: 1.4 10*3/uL — ABNORMAL HIGH (ref 0.1–1.0)
NEUTROS PCT: 56 % (ref 43–77)
Neutro Abs: 7.8 10*3/uL — ABNORMAL HIGH (ref 1.7–7.7)
PLATELETS: 508 10*3/uL — AB (ref 150–400)
RBC: 5.07 MIL/uL (ref 3.87–5.11)
RDW: 15.3 % (ref 11.5–15.5)
WBC: 14 10*3/uL — AB (ref 4.0–10.5)

## 2014-05-17 LAB — LIPID PANEL
CHOLESTEROL: 167 mg/dL (ref 0–200)
HDL: 45 mg/dL (ref >39)
LDL Cholesterol: 72 mg/dL (ref 0–99)
TRIGLYCERIDES: 248 mg/dL — AB (ref <150)
Total CHOL/HDL Ratio: 3.7 Ratio
VLDL: 50 mg/dL — AB (ref 0–40)

## 2014-05-17 LAB — COMPREHENSIVE METABOLIC PANEL
ALBUMIN: 4.2 g/dL (ref 3.5–5.2)
ALT: 28 U/L (ref 0–35)
AST: 18 U/L (ref 0–37)
Alkaline Phosphatase: 102 U/L (ref 39–117)
BUN: 23 mg/dL (ref 6–23)
CALCIUM: 9.7 mg/dL (ref 8.4–10.5)
CHLORIDE: 104 meq/L (ref 96–112)
CO2: 26 meq/L (ref 19–32)
Creat: 0.89 mg/dL (ref 0.50–1.10)
GLUCOSE: 125 mg/dL — AB (ref 70–99)
POTASSIUM: 4.1 meq/L (ref 3.5–5.3)
SODIUM: 141 meq/L (ref 135–145)
TOTAL PROTEIN: 6.6 g/dL (ref 6.0–8.3)
Total Bilirubin: 0.3 mg/dL (ref 0.2–1.2)

## 2014-05-24 ENCOUNTER — Ambulatory Visit (HOSPITAL_COMMUNITY)
Admission: RE | Admit: 2014-05-24 | Discharge: 2014-05-24 | Disposition: A | Discharge status: Home / Self Care | Payer: Medicare Other | Source: Ambulatory Visit | Attending: Surgery | Admitting: Surgery

## 2014-05-24 ENCOUNTER — Other Ambulatory Visit: Payer: Self-pay

## 2014-05-24 ENCOUNTER — Other Ambulatory Visit (INDEPENDENT_AMBULATORY_CARE_PROVIDER_SITE_OTHER): Payer: Self-pay | Admitting: Surgery

## 2014-05-24 DIAGNOSIS — J449 Chronic obstructive pulmonary disease, unspecified: Secondary | ICD-10-CM

## 2014-05-24 DIAGNOSIS — K224 Dyskinesia of esophagus: Secondary | ICD-10-CM | POA: Insufficient documentation

## 2014-05-24 DIAGNOSIS — K449 Diaphragmatic hernia without obstruction or gangrene: Secondary | ICD-10-CM | POA: Insufficient documentation

## 2014-05-24 DIAGNOSIS — F3289 Other specified depressive episodes: Secondary | ICD-10-CM | POA: Insufficient documentation

## 2014-05-24 DIAGNOSIS — Z9889 Other specified postprocedural states: Secondary | ICD-10-CM

## 2014-05-24 DIAGNOSIS — E7800 Pure hypercholesterolemia, unspecified: Secondary | ICD-10-CM

## 2014-05-24 DIAGNOSIS — Z01818 Encounter for other preprocedural examination: Secondary | ICD-10-CM

## 2014-05-24 DIAGNOSIS — L02419 Cutaneous abscess of limb, unspecified: Secondary | ICD-10-CM

## 2014-05-24 DIAGNOSIS — N35919 Unspecified urethral stricture, male, unspecified site: Secondary | ICD-10-CM | POA: Insufficient documentation

## 2014-05-24 DIAGNOSIS — L03119 Cellulitis of unspecified part of limb: Secondary | ICD-10-CM

## 2014-05-24 DIAGNOSIS — J4489 Other specified chronic obstructive pulmonary disease: Secondary | ICD-10-CM

## 2014-05-24 DIAGNOSIS — Z1231 Encounter for screening mammogram for malignant neoplasm of breast: Secondary | ICD-10-CM | POA: Insufficient documentation

## 2014-05-24 DIAGNOSIS — Z6841 Body Mass Index (BMI) 40.0 and over, adult: Secondary | ICD-10-CM | POA: Insufficient documentation

## 2014-05-24 DIAGNOSIS — N6019 Diffuse cystic mastopathy of unspecified breast: Secondary | ICD-10-CM | POA: Insufficient documentation

## 2014-05-24 DIAGNOSIS — I10 Essential (primary) hypertension: Secondary | ICD-10-CM

## 2014-05-24 DIAGNOSIS — F329 Major depressive disorder, single episode, unspecified: Secondary | ICD-10-CM | POA: Insufficient documentation

## 2014-05-24 DIAGNOSIS — Z9884 Bariatric surgery status: Secondary | ICD-10-CM | POA: Insufficient documentation

## 2014-05-24 DIAGNOSIS — K219 Gastro-esophageal reflux disease without esophagitis: Secondary | ICD-10-CM | POA: Insufficient documentation

## 2014-06-09 ENCOUNTER — Encounter: Payer: Medicare Other | Attending: Surgery | Admitting: Dietician

## 2014-06-09 ENCOUNTER — Encounter: Payer: Self-pay | Admitting: Dietician

## 2014-06-09 VITALS — Ht 59.0 in | Wt 237.5 lb

## 2014-06-09 DIAGNOSIS — Z01818 Encounter for other preprocedural examination: Secondary | ICD-10-CM | POA: Diagnosis not present

## 2014-06-09 DIAGNOSIS — Z713 Dietary counseling and surveillance: Secondary | ICD-10-CM | POA: Diagnosis not present

## 2014-06-09 DIAGNOSIS — E669 Obesity, unspecified: Secondary | ICD-10-CM | POA: Diagnosis present

## 2014-06-09 NOTE — Progress Notes (Signed)
  Pre-Op Assessment Visit:  Pre-Operative RYGB Surgery  Medical Nutrition Therapy:  Appt start time: 0900   End time:  0945.  Patient was seen on 06/09/2014 for Pre-Operative RYGB Nutrition Assessment. Assessment and letter of approval faxed to [facility] Bariatric Surgery Program coordinator on 06/09/2014.   Discussed the importance of seeing a psychologist regularly to help with emotional eating. Also explained the importance of regular exercise to protect bones and muscles, improve mobility, and encourage weight loss.   Preferred Learning Style:   No preference indicated   Learning Readiness:   Ready  Handouts given during visit include:  Pre-Op Goals Bariatric Surgery Protein Shakes  Teaching Method Utilized:  Visual Auditory Hands on  Barriers to learning/adherence to lifestyle change: mobility issues, hx of multiple chronic health conditions  Demonstrated degree of understanding via:  Teach Back   Patient to call the [facility] to enroll in Pre-Op and Post-Op Nutrition Education when surgery date is scheduled.

## 2014-06-09 NOTE — Patient Instructions (Signed)
Follow Pre-Op Goals Try Protein Shakes Call [facility] at [phone] when surgery is scheduled to enroll in Pre-Op Class Talk to family doctor to see if you need any changes to medications before surgery Look into starting some exercise to help with help loss

## 2014-06-18 ENCOUNTER — Encounter (HOSPITAL_COMMUNITY)
Admission: RE | Disposition: A | Discharge status: Home / Self Care | Payer: Self-pay | Source: Ambulatory Visit | Attending: Surgery

## 2014-06-18 ENCOUNTER — Ambulatory Visit (HOSPITAL_COMMUNITY)
Admission: RE | Admit: 2014-06-18 | Discharge: 2014-06-18 | Disposition: A | Discharge status: Home / Self Care | Payer: Medicare Other | Source: Ambulatory Visit | Attending: Surgery | Admitting: Surgery

## 2014-06-18 HISTORY — PX: BREATH TEK H PYLORI: SHX5422

## 2014-06-18 SURGERY — BREATH TEST, FOR HELICOBACTER PYLORI

## 2014-06-18 NOTE — Progress Notes (Signed)
06/18/14 [place]  Referring MD Johnathan Hausen  Time of Last PO Intake 2300 (on 06/17/2014)  Baseline Breath At: 0818  Pranactin Given At: 0821  Post-Dose Breath At: 0836  Sample 1 1.5  Sample 2 1.9  Test Negative

## 2014-06-19 ENCOUNTER — Encounter (HOSPITAL_COMMUNITY): Payer: Self-pay | Admitting: Surgery

## 2015-07-26 ENCOUNTER — Emergency Department (HOSPITAL_BASED_OUTPATIENT_CLINIC_OR_DEPARTMENT_OTHER): Payer: Medicare Other

## 2015-07-26 ENCOUNTER — Emergency Department (HOSPITAL_BASED_OUTPATIENT_CLINIC_OR_DEPARTMENT_OTHER)
Admission: EM | Admit: 2015-07-26 | Discharge: 2015-07-26 | Disposition: A | Discharge status: Home / Self Care | Payer: Medicare Other | Attending: Emergency Medicine | Admitting: Emergency Medicine

## 2015-07-26 ENCOUNTER — Encounter (HOSPITAL_BASED_OUTPATIENT_CLINIC_OR_DEPARTMENT_OTHER): Payer: Self-pay | Admitting: *Deleted

## 2015-07-26 DIAGNOSIS — Y998 Other external cause status: Secondary | ICD-10-CM | POA: Diagnosis not present

## 2015-07-26 DIAGNOSIS — S40012A Contusion of left shoulder, initial encounter: Secondary | ICD-10-CM | POA: Diagnosis not present

## 2015-07-26 DIAGNOSIS — Z880 Allergy status to penicillin: Secondary | ICD-10-CM | POA: Insufficient documentation

## 2015-07-26 DIAGNOSIS — Z79899 Other long term (current) drug therapy: Secondary | ICD-10-CM | POA: Diagnosis not present

## 2015-07-26 DIAGNOSIS — S8002XA Contusion of left knee, initial encounter: Secondary | ICD-10-CM | POA: Diagnosis not present

## 2015-07-26 DIAGNOSIS — W108XXA Fall (on) (from) other stairs and steps, initial encounter: Secondary | ICD-10-CM | POA: Diagnosis not present

## 2015-07-26 DIAGNOSIS — S39012A Strain of muscle, fascia and tendon of lower back, initial encounter: Secondary | ICD-10-CM | POA: Diagnosis not present

## 2015-07-26 DIAGNOSIS — F419 Anxiety disorder, unspecified: Secondary | ICD-10-CM | POA: Insufficient documentation

## 2015-07-26 DIAGNOSIS — Y9289 Other specified places as the place of occurrence of the external cause: Secondary | ICD-10-CM | POA: Insufficient documentation

## 2015-07-26 DIAGNOSIS — Y9389 Activity, other specified: Secondary | ICD-10-CM | POA: Diagnosis not present

## 2015-07-26 DIAGNOSIS — F329 Major depressive disorder, single episode, unspecified: Secondary | ICD-10-CM | POA: Insufficient documentation

## 2015-07-26 DIAGNOSIS — Z872 Personal history of diseases of the skin and subcutaneous tissue: Secondary | ICD-10-CM | POA: Insufficient documentation

## 2015-07-26 DIAGNOSIS — I10 Essential (primary) hypertension: Secondary | ICD-10-CM | POA: Insufficient documentation

## 2015-07-26 DIAGNOSIS — M1990 Unspecified osteoarthritis, unspecified site: Secondary | ICD-10-CM | POA: Insufficient documentation

## 2015-07-26 DIAGNOSIS — S3992XA Unspecified injury of lower back, initial encounter: Secondary | ICD-10-CM | POA: Diagnosis present

## 2015-07-26 NOTE — ED Provider Notes (Signed)
CSN: [id number]     Arrival date & time 07/26/15  1537 History   First MD Initiated Contact with Patient 07/26/15 1613     Chief Complaint  Patient presents with  . Back Injury     (Consider location/radiation/quality/duration/timing/severity/associated sxs/prior Treatment) HPI Comments: 64 year old female complaining of low back pain, left knee pain and left shoulder pain after accidentally slipping and falling on the steps 2 days ago. She was carrying her trash down the stairs when she slipped and missed a step causing her to land on her left side. No head injury. She did not have anyone to take her over the past 2 days to get seen, and today she decided to get up out of bed and take herself. Party has low back issues and had surgery by Dr. Saintclair Halsted in the past. She called him today who advised her to go to the emergency department as he is out of town. Pain in her back as sharp and worse than normal, rated 8/10. States she always has numbness and tingling down both of her extremities, left more so than right. Denies loss of control bowels or bladder saddle anesthesia. Tried taking Percocet with no relief. Reports bruising to her left shoulder and left knee. Pain is worse when she presses on the serious or moves her shoulder.  The history is provided by the patient.    Past Medical History  Diagnosis Date  . Hypertension   . Depression   . Anxiety   . H/O hiatal hernia   . Arthritis   . Cellulitis and abscess of leg     last yr   Past Surgical History  Procedure Laterality Date  . Cesarean section    . Hernia repair    . Abdominal hysterectomy    . Gastric bypass      minigastric bypass 2006 in [facility]  . Joint replacement      bil knee  . Tonsillectomy    . Back surgery      02/06/2013  . Breath tek h pylori N/A 06/18/2014    Procedure: BREATH TEK H PYLORI;  Surgeon: Pedro Earls, MD;  Location: [facility];  Service: General;  Laterality: N/A;   Family History  Problem  Relation Age of Onset  . COPD Mother   . Cancer Mother   . Hypertension Sister   . Hyperlipidemia Sister   . Obesity Sister    Social History  Substance Use Topics  . Smoking status: Never Smoker   . Smokeless tobacco: None  . Alcohol Use: Yes     Comment: occ   OB History    No data available     Review of Systems  Musculoskeletal: Positive for back pain.       + L shoulder and knee pain.  Skin: Positive for color change.  All other systems reviewed and are negative.     Allergies  Bee venom; Erythrocin; Fioricet; Other; Penicillins; Clindamycin/lincomycin; Morphine and related; Phenobarbital; Teflaro; and Vancomycin  Home Medications   Prior to Admission medications   Medication Sig Start Date End Date Taking? Authorizing Provider  DOXEPIN HCL PO Take by mouth.   Yes Historical Provider, MD  allopurinol (ZYLOPRIM) 100 MG tablet Take 100 mg by mouth daily.    Historical Provider, MD  ALPRAZolam Duanne Moron) 0.5 MG tablet Take 0.25-0.5 mg by mouth 3 (three) times daily as needed for anxiety.    Historical Provider, MD  ARIPiprazole (ABILIFY) 2 MG tablet Take 2 mg  by mouth daily.    Historical Provider, MD  ascorbic acid (VITAMIN C) 1000 MG tablet Take 1,000 mg by mouth daily.    Historical Provider, MD  aspirin-acetaminophen-caffeine (EXCEDRIN MIGRAINE) [phone] MG per tablet Take by mouth every 6 (six) hours as needed for headache.    Historical Provider, MD  atorvastatin (LIPITOR) 10 MG tablet Take 10 mg by mouth daily.    Historical Provider, MD  bisacodyl (DULCOLAX) 5 MG EC tablet Take 5 mg by mouth daily as needed for constipation.    Historical Provider, MD  Cholecalciferol (VITAMIN D PO) Take 2,000 Units by mouth daily.    Historical Provider, MD  cloNIDine (CATAPRES) 0.1 MG tablet Take 0.1 mg by mouth 2 (two) times daily.    Historical Provider, MD  colchicine 0.6 MG tablet Take 0.6 mg by mouth daily.    Historical Provider, MD  cyclobenzaprine (FLEXERIL) 10 MG  tablet Take 10 mg by mouth 3 (three) times daily as needed for muscle spasms.    Historical Provider, MD  DULoxetine (CYMBALTA) 60 MG capsule Take 60 mg by mouth 2 (two) times daily.    Historical Provider, MD  esomeprazole (NEXIUM) 20 MG capsule Take 20 mg by mouth daily at 12 noon.    Historical Provider, MD  fexofenadine (ALLEGRA) 180 MG tablet Take 180 mg by mouth daily.    Historical Provider, MD  fish oil-omega-3 fatty acids 1000 MG capsule Take 1 g by mouth 2 (two) times daily.    Historical Provider, MD  furosemide (LASIX) 20 MG tablet Take 20-40 mg by mouth 2 (two) times daily. Take 2 tablets in the AM and 1 tablet in the PM    Historical Provider, MD  gabapentin (NEURONTIN) 300 MG capsule Take 300 mg by mouth 3 (three) times daily.    Historical Provider, MD  HYDROcodone-acetaminophen (NORCO) 10-325 MG per tablet Take 0.5-1 tablets by mouth every 8 (eight) hours as needed for pain.    Historical Provider, MD  hydrOXYzine (ATARAX/VISTARIL) 25 MG tablet Take 25 mg by mouth 3 (three) times daily as needed for itching.    Historical Provider, MD  metoprolol succinate (TOPROL-XL) 25 MG 24 hr tablet Take 25 mg by mouth daily.    Historical Provider, MD  Multiple Vitamin (MULTIVITAMIN WITH MINERALS) TABS Take 1 tablet by mouth 2 (two) times daily.    Historical Provider, MD  oxyCODONE-acetaminophen (PERCOCET/ROXICET) 5-325 MG per tablet Take 1 tablet by mouth every 6 (six) hours as needed for pain.    Historical Provider, MD  phentermine 37.5 MG capsule Take 37.5 mg by mouth 2 (two) times daily.    Historical Provider, MD  Polyethyl Glycol-Propyl Glycol (SYSTANE ULTRA OP) Apply to eye.    Historical Provider, MD  promethazine (PHENERGAN) 25 MG tablet Take 25 mg by mouth every 6 (six) hours as needed for nausea.    Historical Provider, MD  silodosin (RAPAFLO) 8 MG CAPS capsule Take 8 mg by mouth daily with breakfast.    Historical Provider, MD  spironolactone (ALDACTONE) 50 MG tablet Take 50 mg by  mouth daily.    Historical Provider, MD  zolpidem (AMBIEN) 10 MG tablet Take 10 mg by mouth at bedtime as needed for sleep.    Historical Provider, MD   BP 136/74 mmHg  Pulse 98  Temp(Src) 98.7 F (37.1 C) (Oral)  Resp 20  Ht 5' (1.524 m)  Wt 195 lb (88.451 kg)  BMI 38.08 kg/m2  SpO2 100% Physical Exam  Constitutional: She is  oriented to person, place, and time. She appears well-developed and well-nourished. No distress.  HENT:  Head: Normocephalic and atraumatic.  Mouth/Throat: Oropharynx is clear and moist.  Eyes: Conjunctivae and EOM are normal.  Neck: Normal range of motion. Neck supple.  Cardiovascular: Normal rate, regular rhythm, normal heart sounds and intact distal pulses.   Pulmonary/Chest: Effort normal and breath sounds normal. No respiratory distress.  Musculoskeletal:  Old midline surgical scar on lumbar spine. TTP lumbar spine and R lumbar paraspinal muscles. No edema or step-off. TTP over L knee and proximal anterior tibia with ecchymosis over knee and proximal tibia. Pain increased when assessing range of motion. TTP over left shoulder and proximal humerus with ecchymosis. No deformity. Full shoulder range of motion with pain noted. No tenderness to the mid and distal humerus.  Neurological: She is alert and oriented to person, place, and time. No sensory deficit.  Strength left lower extremity 4/5 due to pain in left knee, right lower extremity 5/5. Sensation intact.  Skin: Skin is warm and dry.  Psychiatric: She has a normal mood and affect. Her behavior is normal.  Nursing note and vitals reviewed.   ED Course  Procedures (including critical care time) Labs Review Labs Reviewed - No data to display  Imaging Review Dg Lumbar Spine Complete  07/26/2015   CLINICAL DATA:  Fall down stairs 2 days ago. Pain. Initial encounter.  EXAM: LUMBAR SPINE - COMPLETE 4+ VIEW  COMPARISON:  CT of 05/22/2015  FINDINGS: Five lumbar type vertebral bodies. Sacroiliac joints are  symmetric. Cholecystectomy. Status post L4-5 posterior trans pedicle screw fixation, without acute hardware complication. L1 compression deformity is mild to moderate and similar. Remainder vertebral body height maintained. Spondylosis, including facet arthropathy at L1-2 and L2-3.  IMPRESSION: Chronic L1 compression deformity.  L4-5 fixation, without acute hardware complication.   Electronically Signed   By: Abigail Miyamoto M.D.   On: 07/26/2015 17:02   Dg Shoulder Left  07/26/2015   CLINICAL DATA:  Left shoulder pain following fall 2 days ago. Initial encounter.  EXAM: LEFT SHOULDER - 2+ VIEW  COMPARISON:  None.  FINDINGS: There is no evidence of acute fracture, subluxation or dislocation.  Mild degenerative changes at the glenohumeral joint noted.  No focal bony lesions are present.  IMPRESSION: No evidence of acute abnormality.   Electronically Signed   By: Margarette Canada M.D.   On: 07/26/2015 17:03   Dg Knee Complete 4 Views Left  07/26/2015   CLINICAL DATA:  Knee pain after fall.  EXAM: LEFT KNEE - COMPLETE 4+ VIEW  COMPARISON:  None  FINDINGS: Previous left knee arthroplasty. The hardware components are in anatomic alignment and there are no complications. No joint effusion. There is no periprosthetic fracture or subluxation.  IMPRESSION: 1. No acute findings. 2. Previous left knee arthroplasty noted.   Electronically Signed   By: Kerby Moors M.D.   On: 07/26/2015 17:07   I have personally reviewed and evaluated these images and lab results as part of my medical decision-making.   EKG Interpretation None      MDM   Final diagnoses:  Fall down stairs, initial encounter  Lumbar strain, initial encounter  Knee contusion, left, initial encounter  Shoulder contusion, left, initial encounter   Back, shoulder and knee pain after mechanical fall. Nontoxic appearing, NAD. AF VSS. X-rays negative. No red flags concerning patient's back pain. No s/s of central cord compression or cauda equina. Lower  extremities are neurovascularly intact and patient is ambulating  without difficulty. Advised rice and NSAIDs along with the pain medicine that she takes at home. Follow-up with her PCP and Dr. Saintclair Halsted. Stable for discharge. Return precautions given. Patient states understanding of treatment care plan and is agreeable.  Discussed with attending Dr. Ralene Bathe who also evaluated patient and agrees with plan of care.  Carman Ching, PA-C 07/26/15 [place], MD 07/26/15 [phone]

## 2015-07-26 NOTE — Discharge Instructions (Signed)
Contusion A contusion is a deep bruise. Contusions are the result of an injury that caused bleeding under the skin. The contusion may turn blue, purple, or yellow. Minor injuries will give you a painless contusion, but more severe contusions may stay painful and swollen for a few weeks.  CAUSES  A contusion is usually caused by a blow, trauma, or direct force to an area of the body. SYMPTOMS   Swelling and redness of the injured area.  Bruising of the injured area.  Tenderness and soreness of the injured area.  Pain. DIAGNOSIS  The diagnosis can be made by taking a history and physical exam. An X-ray, CT scan, or MRI may be needed to determine if there were any associated injuries, such as fractures. TREATMENT  Specific treatment will depend on what area of the body was injured. In general, the best treatment for a contusion is resting, icing, elevating, and applying cold compresses to the injured area. Over-the-counter medicines may also be recommended for pain control. Ask your caregiver what the best treatment is for your contusion. HOME CARE INSTRUCTIONS   Put ice on the injured area.  Put ice in a plastic bag.  Place a towel between your skin and the bag.  Leave the ice on for 15-20 minutes, 3-4 times a day, or as directed by your health care provider.  Only take over-the-counter or prescription medicines for pain, discomfort, or fever as directed by your caregiver. Your caregiver may recommend avoiding anti-inflammatory medicines (aspirin, ibuprofen, and naproxen) for 48 hours because these medicines may increase bruising.  Rest the injured area.  If possible, elevate the injured area to reduce swelling. SEEK IMMEDIATE MEDICAL CARE IF:   You have increased bruising or swelling.  You have pain that is getting worse.  Your swelling or pain is not relieved with medicines. MAKE SURE YOU:   Understand these instructions.  Will watch your condition.  Will get help right  away if you are not doing well or get worse. Document Released: 08/26/2005 Document Revised: 11/21/2013 Document Reviewed: 09/21/2011 [facility] Patient Information 2015 [place], [place]. This information is not intended to replace advice given to you by your health care provider. Make sure you discuss any questions you have with your health care provider.  Back Pain, Adult Low back pain is very common. About 1 in 5 people have back pain.The cause of low back pain is rarely dangerous. The pain often gets better over time.About half of people with a sudden onset of back pain feel better in just 2 weeks. About 8 in 10 people feel better by 6 weeks.  CAUSES Some common causes of back pain include:  Strain of the muscles or ligaments supporting the spine.  Wear and tear (degeneration) of the spinal discs.  Arthritis.  Direct injury to the back. DIAGNOSIS Most of the time, the direct cause of low back pain is not known.However, back pain can be treated effectively even when the exact cause of the pain is unknown.Answering your caregiver's questions about your overall health and symptoms is one of the most accurate ways to make sure the cause of your pain is not dangerous. If your caregiver needs more information, he or she may order lab work or imaging tests (X-rays or MRIs).However, even if imaging tests show changes in your back, this usually does not require surgery. HOME CARE INSTRUCTIONS For many people, back pain returns.Since low back pain is rarely dangerous, it is often a condition that people can learn to  manageon their own.   Remain active. It is stressful on the back to sit or stand in one place. Do not sit, drive, or stand in one place for more than 30 minutes at a time. Take short walks on level surfaces as soon as pain allows.Try to increase the length of time you walk each day.  Do not stay in bed.Resting more than 1 or 2 days can delay your recovery.  Do not avoid exercise  or work.Your body is made to move.It is not dangerous to be active, even though your back may hurt.Your back will likely heal faster if you return to being active before your pain is gone.  Pay attention to your body when you bend and lift. Many people have less discomfortwhen lifting if they bend their knees, keep the load close to their bodies,and avoid twisting. Often, the most comfortable positions are those that put less stress on your recovering back.  Find a comfortable position to sleep. Use a firm mattress and lie on your side with your knees slightly bent. If you lie on your back, put a pillow under your knees.  Only take over-the-counter or prescription medicines as directed by your caregiver. Over-the-counter medicines to reduce pain and inflammation are often the most helpful.Your caregiver may prescribe muscle relaxant drugs.These medicines help dull your pain so you can more quickly return to your normal activities and healthy exercise.  Put ice on the injured area.  Put ice in a plastic bag.  Place a towel between your skin and the bag.  Leave the ice on for 15-20 minutes, 03-04 times a day for the first 2 to 3 days. After that, ice and heat may be alternated to reduce pain and spasms.  Ask your caregiver about trying back exercises and gentle massage. This may be of some benefit.  Avoid feeling anxious or stressed.Stress increases muscle tension and can worsen back pain.It is important to recognize when you are anxious or stressed and learn ways to manage it.Exercise is a great option. SEEK MEDICAL CARE IF:  You have pain that is not relieved with rest or medicine.  You have pain that does not improve in 1 week.  You have new symptoms.  You are generally not feeling well. SEEK IMMEDIATE MEDICAL CARE IF:   You have pain that radiates from your back into your legs.  You develop new bowel or bladder control problems.  You have unusual weakness or numbness in  your arms or legs.  You develop nausea or vomiting.  You develop abdominal pain.  You feel faint. Document Released: 11/16/2005 Document Revised: 05/17/2012 Document Reviewed: 03/20/2014 [facility] Patient Information 2015 [place], [place]. This information is not intended to replace advice given to you by your health care provider. Make sure you discuss any questions you have with your health care provider.

## 2015-07-26 NOTE — ED Notes (Signed)
Back pain. She fell down steps 2 days ago.

## 2015-08-26 ENCOUNTER — Other Ambulatory Visit: Payer: Self-pay | Admitting: Neurosurgery

## 2015-09-25 ENCOUNTER — Encounter (HOSPITAL_COMMUNITY): Payer: Self-pay

## 2015-09-25 ENCOUNTER — Encounter (HOSPITAL_COMMUNITY)
Admission: RE | Admit: 2015-09-25 | Discharge: 2015-09-25 | Disposition: A | Discharge status: Home / Self Care | Payer: Medicare Other | Source: Ambulatory Visit | Attending: Neurosurgery | Admitting: Neurosurgery

## 2015-09-25 ENCOUNTER — Other Ambulatory Visit (HOSPITAL_COMMUNITY): Payer: Self-pay | Admitting: *Deleted

## 2015-09-25 DIAGNOSIS — Z01812 Encounter for preprocedural laboratory examination: Secondary | ICD-10-CM | POA: Diagnosis not present

## 2015-09-25 DIAGNOSIS — Z79899 Other long term (current) drug therapy: Secondary | ICD-10-CM | POA: Diagnosis not present

## 2015-09-25 DIAGNOSIS — Z01818 Encounter for other preprocedural examination: Secondary | ICD-10-CM | POA: Insufficient documentation

## 2015-09-25 DIAGNOSIS — E119 Type 2 diabetes mellitus without complications: Secondary | ICD-10-CM | POA: Insufficient documentation

## 2015-09-25 DIAGNOSIS — Z0183 Encounter for blood typing: Secondary | ICD-10-CM | POA: Insufficient documentation

## 2015-09-25 DIAGNOSIS — R9431 Abnormal electrocardiogram [ECG] [EKG]: Secondary | ICD-10-CM | POA: Diagnosis not present

## 2015-09-25 DIAGNOSIS — E785 Hyperlipidemia, unspecified: Secondary | ICD-10-CM | POA: Diagnosis not present

## 2015-09-25 DIAGNOSIS — G4733 Obstructive sleep apnea (adult) (pediatric): Secondary | ICD-10-CM | POA: Insufficient documentation

## 2015-09-25 DIAGNOSIS — Z981 Arthrodesis status: Secondary | ICD-10-CM | POA: Diagnosis not present

## 2015-09-25 DIAGNOSIS — M5137 Other intervertebral disc degeneration, lumbosacral region: Secondary | ICD-10-CM | POA: Insufficient documentation

## 2015-09-25 DIAGNOSIS — Z7984 Long term (current) use of oral hypoglycemic drugs: Secondary | ICD-10-CM | POA: Diagnosis not present

## 2015-09-25 DIAGNOSIS — I10 Essential (primary) hypertension: Secondary | ICD-10-CM | POA: Diagnosis not present

## 2015-09-25 HISTORY — DX: Headache: R51

## 2015-09-25 HISTORY — DX: Disorder of kidney and ureter, unspecified: N28.9

## 2015-09-25 HISTORY — DX: Supraventricular tachycardia, unspecified: I47.10

## 2015-09-25 HISTORY — DX: Iron deficiency: E61.1

## 2015-09-25 HISTORY — DX: Other chronic pain: G89.29

## 2015-09-25 HISTORY — DX: Polyneuropathy, unspecified: G62.9

## 2015-09-25 HISTORY — DX: Unspecified convulsions: R56.9

## 2015-09-25 HISTORY — DX: Headache, unspecified: R51.9

## 2015-09-25 HISTORY — DX: Sleep apnea, unspecified: G47.30

## 2015-09-25 HISTORY — DX: Gastro-esophageal reflux disease without esophagitis: K21.9

## 2015-09-25 HISTORY — DX: Supraventricular tachycardia: I47.1

## 2015-09-25 HISTORY — DX: Hyperlipidemia, unspecified: E78.5

## 2015-09-25 HISTORY — DX: Acute pancreatitis without necrosis or infection, unspecified: K85.90

## 2015-09-25 HISTORY — DX: Malignant (primary) neoplasm, unspecified: C80.1

## 2015-09-25 LAB — BASIC METABOLIC PANEL
Anion gap: 16 — ABNORMAL HIGH (ref 5–15)
BUN: 27 mg/dL — ABNORMAL HIGH (ref 6–20)
CHLORIDE: 86 mmol/L — AB (ref 101–111)
CO2: 30 mmol/L (ref 22–32)
Calcium: 10.2 mg/dL (ref 8.9–10.3)
Creatinine, Ser: 1.4 mg/dL — ABNORMAL HIGH (ref 0.44–1.00)
GFR calc non Af Amer: 38 mL/min — ABNORMAL LOW (ref >60)
GFR, EST AFRICAN AMERICAN: 44 mL/min — AB (ref >60)
Glucose, Bld: 120 mg/dL — ABNORMAL HIGH (ref 65–99)
POTASSIUM: 3.4 mmol/L — AB (ref 3.5–5.1)
SODIUM: 132 mmol/L — AB (ref 135–145)

## 2015-09-25 LAB — CBC
HEMATOCRIT: 45.3 % (ref 36.0–46.0)
Hemoglobin: 15 g/dL (ref 12.0–15.0)
MCH: 28 pg (ref 26.0–34.0)
MCHC: 33.1 g/dL (ref 30.0–36.0)
MCV: 84.5 fL (ref 78.0–100.0)
Platelets: 354 10*3/uL (ref 150–400)
RBC: 5.36 MIL/uL — AB (ref 3.87–5.11)
RDW: 14.4 % (ref 11.5–15.5)
WBC: 7.6 10*3/uL (ref 4.0–10.5)

## 2015-09-25 LAB — TYPE AND SCREEN
ABO/RH(D): O POS
Antibody Screen: NEGATIVE

## 2015-09-25 LAB — GLUCOSE, CAPILLARY: Glucose-Capillary: 112 mg/dL — ABNORMAL HIGH (ref 65–99)

## 2015-09-25 LAB — SURGICAL PCR SCREEN
MRSA, PCR: NEGATIVE
Staphylococcus aureus: NEGATIVE

## 2015-09-25 NOTE — Pre-Procedure Instructions (Signed)
[name] [name]  09/25/2015     Your procedure is scheduled on Friday, October 04, 2015 at 7:30 AM.   Report to [facility] Entrance "A" Admitting Office at 5:30 AM.   Call this number if you have problems the morning of surgery: [phone]   Any questions prior to day of surgery, please call [phone] between 8 & 4 PM.   Remember:  Do not eat food or drink liquids after midnight Thursday, 10/03/15.  Take these medicines the morning of surgery with A SIP OF WATER: Allopurinol, Cymbalta (Duloxetine), Doxepin, Metoprolol, Nexium, Eye drops, Xanax - if needed, Oxycodone - if needed  Stop Multivitamins 7 days prior to surgery. Do not use Aspirin products or NSAIDS (Ibuprofen, Aleve, etc) 7 days prior to surgery.   Do not wear jewelry, make-up or nail polish.  Do not wear lotions, powders, or perfumes.  You may wear deodorant.  Do not shave 48 hours prior to surgery.    Do not bring valuables to the hospital.  [facility] is not responsible for any belongings or valuables.  Contacts, dentures or bridgework may not be worn into surgery.  Leave your suitcase in the car.  After surgery it may be brought to your room.  For patients admitted to the hospital, discharge time will be determined by your treatment team.  Special instructions:  [place] - Preparing for Surgery  Before surgery, you can play an important role.  Because skin is not sterile, your skin needs to be as free of germs as possible.  You can reduce the number of germs on you skin by washing with CHG (chlorahexidine gluconate) soap before surgery.  CHG is an antiseptic cleaner which kills germs and bonds with the skin to continue killing germs even after washing.  Please DO NOT use if you have an allergy to CHG or antibacterial soaps.  If your skin becomes reddened/irritated stop using the CHG and inform your nurse when you arrive at Short Stay.  Do not shave (including legs and underarms) for at least  48 hours prior to the first CHG shower.  You may shave your face.  Please follow these instructions carefully:   1.  Shower with CHG Soap the night before surgery and the                                morning of Surgery.  2.  If you choose to wash your hair, wash your hair first as usual with your       normal shampoo.  3.  After you shampoo, rinse your hair and body thoroughly to remove the                      Shampoo.  4.  Use CHG as you would any other liquid soap.  You can apply chg directly       to the skin and wash gently with scrungie or a clean washcloth.  5.  Apply the CHG Soap to your body ONLY FROM THE NECK DOWN.        Do not use on open wounds or open sores.  Avoid contact with your eyes, ears, mouth and genitals (private parts).  Wash genitals (private parts) with your normal soap.  6.  Wash thoroughly, paying special attention to the area where your surgery        will be performed.  7.  Thoroughly rinse your body with warm water from the neck down.  8.  DO NOT shower/wash with your normal soap after using and rinsing off       the CHG Soap.  9.  Pat yourself dry with a clean towel.            10.  Wear clean pajamas.            11.  Place clean sheets on your bed the night of your first shower and do not        sleep with pets.  Day of Surgery  Do not apply any lotions the morning of surgery.  Please wear clean clothes to the hospital.   Please read over the following fact sheets that you were given. Pain Booklet, Coughing and Deep Breathing, Blood Transfusion Information, MRSA Information and Surgical Site Infection Prevention

## 2015-09-25 NOTE — Pre-Procedure Instructions (Signed)
[name] [name]  09/25/2015     Your procedure is scheduled on Friday, October 04, 2015 at 7:30 AM.   Report to [facility] Entrance "A" Admitting Office at 5:30 AM.   Call this number if you have problems the morning of surgery: [phone]   Any questions prior to day of surgery, please call [phone] between 8 & 4 PM.   Remember:  Do not eat food or drink liquids after midnight Thursday, 10/03/15.  Take these medicines the morning of surgery with A SIP OF WATER: Allopurinol, Cymbalta (Duloxetine), Doxepin, Metoprolol (Toprol XL), Nexium, Eye drops, Xanax - if needed, Oxycodone - if needed  Stop Multivitamins 7 days prior to surgery. Do not use Aspirin products or NSAIDS (Ibuprofen, Aleve, etc) 7 days prior to surgery.   Do not wear jewelry, make-up or nail polish.  Do not wear lotions, powders, or perfumes.  You may wear deodorant.  Do not shave 48 hours prior to surgery.    Do not bring valuables to the hospital.  [facility] is not responsible for any belongings or valuables.  Contacts, dentures or bridgework may not be worn into surgery.  Leave your suitcase in the car.  After surgery it may be brought to your room.  For patients admitted to the hospital, discharge time will be determined by your treatment team.  Special instructions:  [place] - Preparing for Surgery  Before surgery, you can play an important role.  Because skin is not sterile, your skin needs to be as free of germs as possible.  You can reduce the number of germs on you skin by washing with CHG (chlorahexidine gluconate) soap before surgery.  CHG is an antiseptic cleaner which kills germs and bonds with the skin to continue killing germs even after washing.  Please DO NOT use if you have an allergy to CHG or antibacterial soaps.  If your skin becomes reddened/irritated stop using the CHG and inform your nurse when you arrive at Short Stay.  Do not shave (including legs and underarms)  for at least 48 hours prior to the first CHG shower.  You may shave your face.  Please follow these instructions carefully:   1.  Shower with CHG Soap the night before surgery and the                                morning of Surgery.  2.  If you choose to wash your hair, wash your hair first as usual with your       normal shampoo.  3.  After you shampoo, rinse your hair and body thoroughly to remove the                      Shampoo.  4.  Use CHG as you would any other liquid soap.  You can apply chg directly       to the skin and wash gently with scrungie or a clean washcloth.  5.  Apply the CHG Soap to your body ONLY FROM THE NECK DOWN.        Do not use on open wounds or open sores.  Avoid contact with your eyes, ears, mouth and genitals (private parts).  Wash genitals (private parts) with your normal soap.  6.  Wash thoroughly, paying special attention to the area where your surgery        will be  performed.  7.  Thoroughly rinse your body with warm water from the neck down.  8.  DO NOT shower/wash with your normal soap after using and rinsing off       the CHG Soap.  9.  Pat yourself dry with a clean towel.            10.  Wear clean pajamas.            11.  Place clean sheets on your bed the night of your first shower and do not        sleep with pets.  Day of Surgery  Do not apply any lotions the morning of surgery.  Please wear clean clothes to the hospital.   Please read over the following fact sheets that you were given. Pain Booklet, Coughing and Deep Breathing, Blood Transfusion Information, MRSA Information and Surgical Site Infection Prevention

## 2015-09-25 NOTE — Progress Notes (Addendum)
Pt denies cardiac history. I asked her about a history of SVT (which was in PCP's notes), she stated that she didn't remember having anything like that "unless it was when I was pregnant". She denies chest pain or sob. When asked about taking Metformin, she stated "I don't take that, I don't even know what it is". When the pharmacy tech and I explained to her it was for diabetes, she stated "I had diabetes last week, but I don't have it this week". She states that she was called by her PCP's office this morning and was told her lab work showed she did not have diabetes. We asked if she was told to stop Metformin and she stated "they are going to tell me next week" and "I still don't know what that is for". Again, we explained it was for diabetes.  Will call Dr. Clyde Lundborg office tomorrow to get results of holter monitor and lab results (office is closed on Wednesday afternoons).

## 2015-09-26 NOTE — Progress Notes (Signed)
I called Dr. Clyde Lundborg office and spoke with Maudie Mercury, nurse for Dr. Asher Muir. She is very familiar with Mrs. Shanker. She states pt has been informed that yes, she does have diabetes and she was given Metformin. Maudie Mercury also states that pt was instructed about diet changes and to follow up for a fasting blood sugar next week. Maudie Mercury will be faxing copy of an EKG and results of a holter monitor study.

## 2015-09-27 NOTE — Progress Notes (Addendum)
Anesthesia Chart Review:  Pt is 64 year old female scheduled for L3-4 PLIF on 10/04/2015 with Dr. Saintclair Halsted.   PCP is Dr. Charleston Poot. Nephrologist is Dr. Willene Hatchet, who follows pt for fluid overload, hyponatremia, hypokalemia.   PMH includes: HTN, DM, hyperlipidemia, SVT (pt denies this), pancreatitis, OSA (not on CPAP). Never smoker. BMI 36. S/p cholecystectomy 01/2015. S/p posterior lumbar fusion 02/06/13.   Medications include: bumetanide, nexium, metformin, metoprolol, potassium, spironolactone  Preoperative labs reviewed.  Most recent hgbA1c was 7.3 on 09/13/15. Cl is 86, Na 132.   EKG 09/25/15: NSR. Cannot rule out Inferior infarct, age undetermined. No significant change since prior tracing dated 05/24/14 per Dr. Evette Georges interpretation.   Holter monitor 08/20/2015: Rare PVCs and PACs. Benign 24hr Holter tracing.   Pt has clearance for surgery form Dr. Willene Hatchet.   Reviewed labs with Dr. Lissa Hoard.   I have reached out to Dr. Caesar Chestnut office for guidance on low Cl. Dr. Willene Hatchet is out of the office, but one of his partners will review situation and call back with any recommendations. I faxed a copy of Ms. Ostrander's BMET results to his office.   Will revisit chart when more info available.   Willeen Cass, FNP-BC [facility] Short Stay Surgical Center/Anesthesiology Phone: [phone] 09/27/2015 4:40 PM  Addendum: I spoke with nurse Jeani Hawking at Dr. Jefm Bryant office [phone]). She was actually able to get Dr. Willene Hatchet to review PAT labs. He felt the low Cl was due to patient's diuretic and free water intake. He did not recommend any changes except to recheck a BMET pre- and post-operatively. Jeani Hawking reports this was already communicated to Dr. Windy Carina office as well.  I will order a STAT BMET on arrival the day of surgery.  George Hugh [facility] Short Stay Center/Anesthesiology Phone [phone] 10/01/2015 1:58 PM

## 2015-10-03 NOTE — Progress Notes (Signed)
Per Lorriane Shire, patient is not to receive steroids due to recently diagnosed with diabetes.

## 2015-10-04 ENCOUNTER — Inpatient Hospital Stay (HOSPITAL_COMMUNITY): Payer: Medicare Other | Admitting: Emergency Medicine

## 2015-10-04 ENCOUNTER — Inpatient Hospital Stay (HOSPITAL_COMMUNITY): Payer: Medicare Other | Admitting: Anesthesiology

## 2015-10-04 ENCOUNTER — Inpatient Hospital Stay (HOSPITAL_COMMUNITY): Payer: Medicare Other

## 2015-10-04 ENCOUNTER — Inpatient Hospital Stay (HOSPITAL_COMMUNITY)
Admission: RE | Admit: 2015-10-04 | Discharge: 2015-10-14 | DRG: 460 | Disposition: A | Discharge status: Skilled Nursing Facility | Payer: Medicare Other | Source: Ambulatory Visit | Attending: Neurosurgery | Admitting: Neurosurgery

## 2015-10-04 ENCOUNTER — Encounter (HOSPITAL_COMMUNITY)
Admission: RE | Disposition: A | Discharge status: Skilled Nursing Facility | Payer: Medicare Other | Source: Ambulatory Visit | Attending: Neurosurgery

## 2015-10-04 ENCOUNTER — Encounter (HOSPITAL_COMMUNITY): Payer: Self-pay | Admitting: *Deleted

## 2015-10-04 DIAGNOSIS — Z79891 Long term (current) use of opiate analgesic: Secondary | ICD-10-CM | POA: Diagnosis not present

## 2015-10-04 DIAGNOSIS — M48061 Spinal stenosis, lumbar region without neurogenic claudication: Secondary | ICD-10-CM | POA: Diagnosis present

## 2015-10-04 DIAGNOSIS — F05 Delirium due to known physiological condition: Secondary | ICD-10-CM | POA: Diagnosis not present

## 2015-10-04 DIAGNOSIS — Z79899 Other long term (current) drug therapy: Secondary | ICD-10-CM | POA: Diagnosis not present

## 2015-10-04 DIAGNOSIS — Z91030 Bee allergy status: Secondary | ICD-10-CM | POA: Diagnosis not present

## 2015-10-04 DIAGNOSIS — M545 Low back pain: Secondary | ICD-10-CM | POA: Diagnosis present

## 2015-10-04 DIAGNOSIS — Z96653 Presence of artificial knee joint, bilateral: Secondary | ICD-10-CM | POA: Diagnosis present

## 2015-10-04 DIAGNOSIS — Z885 Allergy status to narcotic agent status: Secondary | ICD-10-CM

## 2015-10-04 DIAGNOSIS — E876 Hypokalemia: Secondary | ICD-10-CM | POA: Diagnosis present

## 2015-10-04 DIAGNOSIS — I10 Essential (primary) hypertension: Secondary | ICD-10-CM | POA: Diagnosis present

## 2015-10-04 DIAGNOSIS — E785 Hyperlipidemia, unspecified: Secondary | ICD-10-CM | POA: Diagnosis present

## 2015-10-04 DIAGNOSIS — Z85828 Personal history of other malignant neoplasm of skin: Secondary | ICD-10-CM

## 2015-10-04 DIAGNOSIS — M4806 Spinal stenosis, lumbar region: Principal | ICD-10-CM | POA: Diagnosis present

## 2015-10-04 DIAGNOSIS — G629 Polyneuropathy, unspecified: Secondary | ICD-10-CM | POA: Diagnosis present

## 2015-10-04 DIAGNOSIS — F4323 Adjustment disorder with mixed anxiety and depressed mood: Secondary | ICD-10-CM | POA: Diagnosis not present

## 2015-10-04 DIAGNOSIS — T380X5A Adverse effect of glucocorticoids and synthetic analogues, initial encounter: Secondary | ICD-10-CM | POA: Diagnosis not present

## 2015-10-04 DIAGNOSIS — G8929 Other chronic pain: Secondary | ICD-10-CM | POA: Diagnosis present

## 2015-10-04 DIAGNOSIS — Z888 Allergy status to other drugs, medicaments and biological substances status: Secondary | ICD-10-CM | POA: Diagnosis not present

## 2015-10-04 DIAGNOSIS — Z881 Allergy status to other antibiotic agents status: Secondary | ICD-10-CM

## 2015-10-04 DIAGNOSIS — Z7984 Long term (current) use of oral hypoglycemic drugs: Secondary | ICD-10-CM

## 2015-10-04 DIAGNOSIS — Z419 Encounter for procedure for purposes other than remedying health state, unspecified: Secondary | ICD-10-CM

## 2015-10-04 DIAGNOSIS — Z880 Allergy status to penicillin: Secondary | ICD-10-CM

## 2015-10-04 DIAGNOSIS — F419 Anxiety disorder, unspecified: Secondary | ICD-10-CM | POA: Diagnosis present

## 2015-10-04 DIAGNOSIS — F060 Psychotic disorder with hallucinations due to known physiological condition: Secondary | ICD-10-CM | POA: Diagnosis not present

## 2015-10-04 DIAGNOSIS — Z981 Arthrodesis status: Secondary | ICD-10-CM

## 2015-10-04 DIAGNOSIS — Z8249 Family history of ischemic heart disease and other diseases of the circulatory system: Secondary | ICD-10-CM

## 2015-10-04 DIAGNOSIS — K219 Gastro-esophageal reflux disease without esophagitis: Secondary | ICD-10-CM | POA: Diagnosis present

## 2015-10-04 DIAGNOSIS — E871 Hypo-osmolality and hyponatremia: Secondary | ICD-10-CM | POA: Diagnosis not present

## 2015-10-04 LAB — BASIC METABOLIC PANEL
Anion gap: 9 (ref 5–15)
BUN: 31 mg/dL — ABNORMAL HIGH (ref 6–20)
CHLORIDE: 99 mmol/L — AB (ref 101–111)
CO2: 28 mmol/L (ref 22–32)
CREATININE: 1.32 mg/dL — AB (ref 0.44–1.00)
Calcium: 9.7 mg/dL (ref 8.9–10.3)
GFR, EST AFRICAN AMERICAN: 48 mL/min — AB (ref >60)
GFR, EST NON AFRICAN AMERICAN: 41 mL/min — AB (ref >60)
Glucose, Bld: 105 mg/dL — ABNORMAL HIGH (ref 65–99)
POTASSIUM: 4.5 mmol/L (ref 3.5–5.1)
SODIUM: 136 mmol/L (ref 135–145)

## 2015-10-04 LAB — GLUCOSE, CAPILLARY
GLUCOSE-CAPILLARY: 123 mg/dL — AB (ref 65–99)
Glucose-Capillary: 104 mg/dL — ABNORMAL HIGH (ref 65–99)
Glucose-Capillary: 115 mg/dL — ABNORMAL HIGH (ref 65–99)
Glucose-Capillary: 129 mg/dL — ABNORMAL HIGH (ref 65–99)

## 2015-10-04 SURGERY — POSTERIOR LUMBAR FUSION 1 LEVEL
Anesthesia: General | Site: Back

## 2015-10-04 MED ORDER — MIDAZOLAM HCL 5 MG/5ML IJ SOLN
INTRAMUSCULAR | Status: DC | PRN
  Administered 2015-10-04 (×2): 1 mg via INTRAVENOUS

## 2015-10-04 MED ORDER — MAGNESIUM OXIDE 400 (241.3 MG) MG PO TABS
400.0000 mg | ORAL_TABLET | Freq: Every day | ORAL | Status: DC
  Administered 2015-10-04 – 2015-10-14 (×11): 400 mg via ORAL
  Filled 2015-10-04 (×11): qty 1

## 2015-10-04 MED ORDER — DOXEPIN HCL 25 MG PO CAPS
25.0000 mg | ORAL_CAPSULE | Freq: Two times a day (BID) | ORAL | Status: DC
  Administered 2015-10-04 – 2015-10-14 (×20): 25 mg via ORAL
  Filled 2015-10-04 (×21): qty 1

## 2015-10-04 MED ORDER — 0.9 % SODIUM CHLORIDE (POUR BTL) OPTIME
TOPICAL | Status: DC | PRN
  Administered 2015-10-04: 1000 mL

## 2015-10-04 MED ORDER — MIDAZOLAM HCL 2 MG/2ML IJ SOLN
INTRAMUSCULAR | Status: AC
  Filled 2015-10-04: qty 4

## 2015-10-04 MED ORDER — DEXAMETHASONE SODIUM PHOSPHATE 10 MG/ML IJ SOLN
INTRAMUSCULAR | Status: AC
  Filled 2015-10-04: qty 1

## 2015-10-04 MED ORDER — FENTANYL CITRATE (PF) 250 MCG/5ML IJ SOLN
INTRAMUSCULAR | Status: AC
  Filled 2015-10-04: qty 5

## 2015-10-04 MED ORDER — DULOXETINE HCL 60 MG PO CPEP
60.0000 mg | ORAL_CAPSULE | Freq: Two times a day (BID) | ORAL | Status: DC
  Administered 2015-10-04 – 2015-10-14 (×20): 60 mg via ORAL
  Filled 2015-10-04 (×21): qty 1

## 2015-10-04 MED ORDER — ACETAMINOPHEN 325 MG PO TABS
650.0000 mg | ORAL_TABLET | ORAL | Status: DC | PRN
  Administered 2015-10-10 – 2015-10-13 (×4): 650 mg via ORAL
  Filled 2015-10-04 (×5): qty 2

## 2015-10-04 MED ORDER — PHENYLEPHRINE 40 MCG/ML (10ML) SYRINGE FOR IV PUSH (FOR BLOOD PRESSURE SUPPORT)
PREFILLED_SYRINGE | INTRAVENOUS | Status: AC
  Filled 2015-10-04: qty 10

## 2015-10-04 MED ORDER — METOPROLOL SUCCINATE ER 25 MG PO TB24
50.0000 mg | ORAL_TABLET | Freq: Two times a day (BID) | ORAL | Status: DC
  Administered 2015-10-04 – 2015-10-14 (×20): 50 mg via ORAL
  Filled 2015-10-04 (×21): qty 2

## 2015-10-04 MED ORDER — SULFAMETHOXAZOLE-TRIMETHOPRIM 800-160 MG PO TABS: 1.0000 | ORAL_TABLET | Freq: Two times a day (BID) | ORAL | Status: DC

## 2015-10-04 MED ORDER — ONDANSETRON HCL 4 MG/2ML IJ SOLN
INTRAMUSCULAR | Status: AC
  Filled 2015-10-04: qty 2

## 2015-10-04 MED ORDER — PHENOL 1.4 % MT LIQD: 1.0000 | OROMUCOSAL | Status: DC | PRN

## 2015-10-04 MED ORDER — POLYETHYLENE GLYCOL 3350 17 G PO PACK
17.0000 g | PACK | Freq: Every day | ORAL | Status: DC
  Administered 2015-10-04 – 2015-10-14 (×11): 17 g via ORAL
  Filled 2015-10-04 (×11): qty 1

## 2015-10-04 MED ORDER — VANCOMYCIN HCL 1000 MG IV SOLR
INTRAVENOUS | Status: AC
  Filled 2015-10-04: qty 1000

## 2015-10-04 MED ORDER — VANCOMYCIN HCL 1000 MG IV SOLR
INTRAVENOUS | Status: DC | PRN
  Administered 2015-10-04: 1000 mg via TOPICAL

## 2015-10-04 MED ORDER — GLYCOPYRROLATE 0.2 MG/ML IJ SOLN
INTRAMUSCULAR | Status: AC
  Filled 2015-10-04: qty 3

## 2015-10-04 MED ORDER — ACETAMINOPHEN 10 MG/ML IV SOLN
INTRAVENOUS | Status: AC
  Administered 2015-10-04: 1000 mg via INTRAVENOUS
  Filled 2015-10-04: qty 100

## 2015-10-04 MED ORDER — LIDOCAINE HCL (CARDIAC) 20 MG/ML IV SOLN
INTRAVENOUS | Status: AC
  Filled 2015-10-04: qty 5

## 2015-10-04 MED ORDER — LACTATED RINGERS IV SOLN
INTRAVENOUS | Status: DC | PRN
  Administered 2015-10-04 (×3): via INTRAVENOUS

## 2015-10-04 MED ORDER — ADULT MULTIVITAMIN W/MINERALS CH
1.0000 | ORAL_TABLET | Freq: Two times a day (BID) | ORAL | Status: DC
  Administered 2015-10-04 – 2015-10-14 (×19): 1 via ORAL
  Filled 2015-10-04 (×20): qty 1

## 2015-10-04 MED ORDER — POTASSIUM CHLORIDE CRYS ER 10 MEQ PO TBCR
10.0000 meq | EXTENDED_RELEASE_TABLET | Freq: Every day | ORAL | Status: DC
  Administered 2015-10-04 – 2015-10-09 (×6): 10 meq via ORAL
  Filled 2015-10-04 (×6): qty 1

## 2015-10-04 MED ORDER — DOCUSATE SODIUM 100 MG PO CAPS
100.0000 mg | ORAL_CAPSULE | Freq: Two times a day (BID) | ORAL | Status: DC
  Administered 2015-10-04 – 2015-10-14 (×20): 100 mg via ORAL
  Filled 2015-10-04 (×20): qty 1

## 2015-10-04 MED ORDER — SODIUM CHLORIDE 0.9 % IJ SOLN
3.0000 mL | INTRAMUSCULAR | Status: DC | PRN
  Administered 2015-10-09 – 2015-10-10 (×2): 3 mL via INTRAVENOUS
  Filled 2015-10-04 (×2): qty 3

## 2015-10-04 MED ORDER — GLYCOPYRROLATE 0.2 MG/ML IJ SOLN
INTRAMUSCULAR | Status: AC
  Filled 2015-10-04: qty 2

## 2015-10-04 MED ORDER — ROCURONIUM BROMIDE 50 MG/5ML IV SOLN
INTRAVENOUS | Status: AC
  Filled 2015-10-04: qty 1

## 2015-10-04 MED ORDER — ROCURONIUM BROMIDE 100 MG/10ML IV SOLN
INTRAVENOUS | Status: DC | PRN
  Administered 2015-10-04: 20 mg via INTRAVENOUS
  Administered 2015-10-04: 10 mg via INTRAVENOUS
  Administered 2015-10-04: 50 mg via INTRAVENOUS
  Administered 2015-10-04: 10 mg via INTRAVENOUS

## 2015-10-04 MED ORDER — HYDROMORPHONE HCL 1 MG/ML IJ SOLN
INTRAMUSCULAR | Status: AC
  Filled 2015-10-04: qty 1

## 2015-10-04 MED ORDER — LIDOCAINE-EPINEPHRINE 1 %-1:100000 IJ SOLN
INTRAMUSCULAR | Status: DC | PRN
  Administered 2015-10-04: 10 mL

## 2015-10-04 MED ORDER — LOPERAMIDE HCL 2 MG PO CAPS: 2.0000 mg | ORAL_CAPSULE | Freq: Every day | ORAL | Status: DC | PRN

## 2015-10-04 MED ORDER — EPHEDRINE SULFATE 50 MG/ML IJ SOLN
INTRAMUSCULAR | Status: AC
  Filled 2015-10-04: qty 1

## 2015-10-04 MED ORDER — SPIRONOLACTONE 25 MG PO TABS
50.0000 mg | ORAL_TABLET | Freq: Two times a day (BID) | ORAL | Status: DC
  Administered 2015-10-04 – 2015-10-14 (×21): 50 mg via ORAL
  Filled 2015-10-04 (×21): qty 2

## 2015-10-04 MED ORDER — PROPOFOL 10 MG/ML IV BOLUS
INTRAVENOUS | Status: DC | PRN
  Administered 2015-10-04: 150 mg via INTRAVENOUS

## 2015-10-04 MED ORDER — HYDROMORPHONE HCL 1 MG/ML IJ SOLN
0.5000 mg | INTRAMUSCULAR | Status: DC | PRN
  Administered 2015-10-04 – 2015-10-11 (×8): 1 mg via INTRAVENOUS
  Administered 2015-10-11: 0.5 mg via INTRAVENOUS
  Administered 2015-10-12 – 2015-10-13 (×2): 1 mg via INTRAVENOUS
  Filled 2015-10-04 (×11): qty 1

## 2015-10-04 MED ORDER — ERYTHROMYCIN 5 MG/GM OP OINT
1.0000 | TOPICAL_OINTMENT | Freq: Every day | OPHTHALMIC | Status: DC
Start: 2015-10-04 — End: 2015-10-14
  Administered 2015-10-04 – 2015-10-10 (×7): 1 via OPHTHALMIC
  Filled 2015-10-04 (×3): qty 3.5

## 2015-10-04 MED ORDER — ZOLPIDEM TARTRATE 5 MG PO TABS
5.0000 mg | ORAL_TABLET | Freq: Every day | ORAL | Status: DC
  Administered 2015-10-04 – 2015-10-13 (×10): 5 mg via ORAL
  Filled 2015-10-04 (×10): qty 1

## 2015-10-04 MED ORDER — PROPOFOL 10 MG/ML IV BOLUS
INTRAVENOUS | Status: AC
  Filled 2015-10-04: qty 20

## 2015-10-04 MED ORDER — BUPIVACAINE LIPOSOME 1.3 % IJ SUSP
INTRAMUSCULAR | Status: DC | PRN
  Administered 2015-10-04: 20 mL

## 2015-10-04 MED ORDER — ONDANSETRON HCL 4 MG/2ML IJ SOLN
4.0000 mg | INTRAMUSCULAR | Status: DC | PRN
  Administered 2015-10-10: 4 mg via INTRAVENOUS
  Filled 2015-10-04: qty 2

## 2015-10-04 MED ORDER — OFLOXACIN 0.3 % OP SOLN
1.0000 [drp] | Freq: Four times a day (QID) | OPHTHALMIC | Status: DC
  Administered 2015-10-04 – 2015-10-14 (×41): 1 [drp] via OPHTHALMIC
  Filled 2015-10-04: qty 5

## 2015-10-04 MED ORDER — VANCOMYCIN HCL IN DEXTROSE 1-5 GM/200ML-% IV SOLN
INTRAVENOUS | Status: AC
  Administered 2015-10-04: 1000 mg via INTRAVENOUS
  Filled 2015-10-04: qty 200

## 2015-10-04 MED ORDER — LIDOCAINE HCL (CARDIAC) 20 MG/ML IV SOLN
INTRAVENOUS | Status: AC
  Filled 2015-10-04: qty 10

## 2015-10-04 MED ORDER — ONDANSETRON HCL 4 MG/2ML IJ SOLN
INTRAMUSCULAR | Status: DC | PRN
  Administered 2015-10-04: 4 mg via INTRAVENOUS

## 2015-10-04 MED ORDER — VANCOMYCIN HCL IN DEXTROSE 750-5 MG/150ML-% IV SOLN
750.0000 mg | Freq: Once | INTRAVENOUS | Status: AC
  Administered 2015-10-04: 750 mg via INTRAVENOUS
  Filled 2015-10-04: qty 150

## 2015-10-04 MED ORDER — SODIUM CHLORIDE 0.9 % IV SOLN: 250.0000 mL | INTRAVENOUS | Status: DC

## 2015-10-04 MED ORDER — MENTHOL 3 MG MT LOZG: 1.0000 | LOZENGE | OROMUCOSAL | Status: DC | PRN

## 2015-10-04 MED ORDER — POLYVINYL ALCOHOL 1.4 % OP SOLN
1.0000 [drp] | OPHTHALMIC | Status: DC | PRN
  Filled 2015-10-04: qty 15

## 2015-10-04 MED ORDER — LIDOCAINE HCL (CARDIAC) 20 MG/ML IV SOLN
INTRAVENOUS | Status: DC | PRN
  Administered 2015-10-04: 80 mg via INTRAVENOUS

## 2015-10-04 MED ORDER — ACETAMINOPHEN 650 MG RE SUPP: 650.0000 mg | RECTAL | Status: DC | PRN

## 2015-10-04 MED ORDER — ROCURONIUM BROMIDE 50 MG/5ML IV SOLN
INTRAVENOUS | Status: AC
  Filled 2015-10-04: qty 2

## 2015-10-04 MED ORDER — SODIUM CHLORIDE 0.9 % IJ SOLN
INTRAMUSCULAR | Status: AC
  Filled 2015-10-04: qty 10

## 2015-10-04 MED ORDER — CYCLOBENZAPRINE HCL 10 MG PO TABS
10.0000 mg | ORAL_TABLET | Freq: Three times a day (TID) | ORAL | Status: DC | PRN
  Administered 2015-10-04 – 2015-10-14 (×13): 10 mg via ORAL
  Filled 2015-10-04 (×14): qty 1

## 2015-10-04 MED ORDER — GLYCOPYRROLATE 0.2 MG/ML IJ SOLN
INTRAMUSCULAR | Status: AC
  Filled 2015-10-04: qty 4

## 2015-10-04 MED ORDER — SODIUM CHLORIDE 0.9 % IJ SOLN
3.0000 mL | Freq: Two times a day (BID) | INTRAMUSCULAR | Status: DC
  Administered 2015-10-04 – 2015-10-14 (×15): 3 mL via INTRAVENOUS

## 2015-10-04 MED ORDER — ESMOLOL HCL 100 MG/10ML IV SOLN
INTRAVENOUS | Status: AC
  Filled 2015-10-04: qty 10

## 2015-10-04 MED ORDER — BUPIVACAINE LIPOSOME 1.3 % IJ SUSP
20.0000 mL | INTRAMUSCULAR | Status: AC
  Filled 2015-10-04: qty 20

## 2015-10-04 MED ORDER — HYDROMORPHONE HCL 1 MG/ML IJ SOLN
0.2500 mg | INTRAMUSCULAR | Status: DC | PRN
  Administered 2015-10-04 (×3): 0.5 mg via INTRAVENOUS

## 2015-10-04 MED ORDER — SUCCINYLCHOLINE CHLORIDE 20 MG/ML IJ SOLN
INTRAMUSCULAR | Status: AC
  Filled 2015-10-04: qty 1

## 2015-10-04 MED ORDER — ONDANSETRON HCL 4 MG PO TABS
8.0000 mg | ORAL_TABLET | Freq: Three times a day (TID) | ORAL | Status: DC | PRN
  Filled 2015-10-04: qty 2

## 2015-10-04 MED ORDER — PHENYLEPHRINE HCL 10 MG/ML IJ SOLN
INTRAMUSCULAR | Status: DC | PRN
  Administered 2015-10-04: 120 ug via INTRAVENOUS
  Administered 2015-10-04 (×2): 80 ug via INTRAVENOUS
  Administered 2015-10-04 (×2): 40 ug via INTRAVENOUS

## 2015-10-04 MED ORDER — SENNOSIDES-DOCUSATE SODIUM 8.6-50 MG PO TABS
1.0000 | ORAL_TABLET | Freq: Every evening | ORAL | Status: DC | PRN
  Administered 2015-10-08: 1 via ORAL
  Filled 2015-10-04: qty 1

## 2015-10-04 MED ORDER — METOLAZONE 2.5 MG PO TABS
2.5000 mg | ORAL_TABLET | Freq: Every day | ORAL | Status: DC
  Administered 2015-10-04 – 2015-10-14 (×11): 2.5 mg via ORAL
  Filled 2015-10-04 (×12): qty 1

## 2015-10-04 MED ORDER — PANTOPRAZOLE SODIUM 40 MG PO TBEC
40.0000 mg | DELAYED_RELEASE_TABLET | Freq: Every day | ORAL | Status: DC
Start: 2015-10-05 — End: 2015-10-14
  Administered 2015-10-05 – 2015-10-14 (×8): 40 mg via ORAL
  Filled 2015-10-04 (×12): qty 1

## 2015-10-04 MED ORDER — NEOSTIGMINE METHYLSULFATE 10 MG/10ML IV SOLN
INTRAVENOUS | Status: AC
  Filled 2015-10-04: qty 1

## 2015-10-04 MED ORDER — GLYCOPYRROLATE 0.2 MG/ML IJ SOLN
INTRAMUSCULAR | Status: DC | PRN
Start: 2015-10-04 — End: 2015-10-04
  Administered 2015-10-04: 0.6 mg via INTRAVENOUS

## 2015-10-04 MED ORDER — FENTANYL CITRATE (PF) 100 MCG/2ML IJ SOLN
INTRAMUSCULAR | Status: DC | PRN
  Administered 2015-10-04: 50 ug via INTRAVENOUS
  Administered 2015-10-04: 100 ug via INTRAVENOUS
  Administered 2015-10-04 (×2): 50 ug via INTRAVENOUS

## 2015-10-04 MED ORDER — COLCHICINE 0.6 MG PO TABS
0.6000 mg | ORAL_TABLET | Freq: Every day | ORAL | Status: DC
  Administered 2015-10-04 – 2015-10-14 (×10): 0.6 mg via ORAL
  Filled 2015-10-04 (×11): qty 1

## 2015-10-04 MED ORDER — THROMBIN 20000 UNITS EX SOLR
CUTANEOUS | Status: DC | PRN
  Administered 2015-10-04: 08:00:00 via TOPICAL

## 2015-10-04 MED ORDER — CARBOXYMETHYLCELLUL-GLYCERIN 0.5-0.9 % OP SOLN: 1.0000 [drp] | Freq: Every day | OPHTHALMIC | Status: DC | PRN

## 2015-10-04 MED ORDER — CALCIUM CARBONATE ANTACID 500 MG PO CHEW
1.0000 | CHEWABLE_TABLET | Freq: Every day | ORAL | Status: DC | PRN
  Administered 2015-10-10 – 2015-10-12 (×3): 200 mg via ORAL
  Filled 2015-10-04 (×3): qty 1

## 2015-10-04 MED ORDER — OXYCODONE-ACETAMINOPHEN 5-325 MG PO TABS
1.0000 | ORAL_TABLET | ORAL | Status: DC | PRN
  Administered 2015-10-04 – 2015-10-06 (×5): 2 via ORAL
  Filled 2015-10-04 (×5): qty 2

## 2015-10-04 MED ORDER — METFORMIN HCL 500 MG PO TABS
500.0000 mg | ORAL_TABLET | Freq: Every day | ORAL | Status: DC
  Administered 2015-10-05 – 2015-10-10 (×6): 500 mg via ORAL
  Filled 2015-10-04 (×6): qty 1

## 2015-10-04 MED ORDER — NEOSTIGMINE METHYLSULFATE 10 MG/10ML IV SOLN
INTRAVENOUS | Status: AC
  Filled 2015-10-04: qty 2

## 2015-10-04 MED ORDER — NEOSTIGMINE METHYLSULFATE 10 MG/10ML IV SOLN
INTRAVENOUS | Status: DC | PRN
  Administered 2015-10-04: 4 mg via INTRAVENOUS

## 2015-10-04 MED ORDER — POTASSIUM CHLORIDE IN NACL 20-0.9 MEQ/L-% IV SOLN
INTRAVENOUS | Status: DC
  Administered 2015-10-04 – 2015-10-10 (×4): via INTRAVENOUS
  Filled 2015-10-04 (×21): qty 1000

## 2015-10-04 MED ORDER — BUMETANIDE 1 MG PO TABS
1.0000 mg | ORAL_TABLET | Freq: Two times a day (BID) | ORAL | Status: DC
  Administered 2015-10-04 – 2015-10-14 (×21): 1 mg via ORAL
  Filled 2015-10-04 (×25): qty 1

## 2015-10-04 MED ORDER — ALLOPURINOL 100 MG PO TABS
300.0000 mg | ORAL_TABLET | Freq: Every day | ORAL | Status: DC
  Administered 2015-10-04 – 2015-10-14 (×11): 300 mg via ORAL
  Filled 2015-10-04 (×11): qty 3

## 2015-10-04 MED ORDER — PROMETHAZINE HCL 25 MG/ML IJ SOLN: 6.2500 mg | INTRAMUSCULAR | Status: DC | PRN

## 2015-10-04 MED ORDER — SODIUM CHLORIDE 0.9 % IR SOLN
Status: DC | PRN
  Administered 2015-10-04: 08:00:00

## 2015-10-04 MED FILL — Heparin Sodium (Porcine) Inj 1000 Unit/ML: INTRAMUSCULAR | Qty: 30 | Status: AC

## 2015-10-04 MED FILL — Sodium Chloride IV Soln 0.9%: INTRAVENOUS | Qty: 1000 | Status: AC

## 2015-10-04 SURGICAL SUPPLY — 71 items
BAG DECANTER FOR FLEXI CONT (MISCELLANEOUS) ×3 IMPLANT
BENZOIN TINCTURE PRP APPL 2/3 (GAUZE/BANDAGES/DRESSINGS) ×3 IMPLANT
BLADE CLIPPER SURG (BLADE) IMPLANT
BLADE SURG 11 STRL SS (BLADE) ×3 IMPLANT
BONE ALLOSTEM MORSELIZED 5CC (Bone Implant) ×3 IMPLANT
BRUSH SCRUB EZ PLAIN DRY (MISCELLANEOUS) ×3 IMPLANT
BUR MATCHSTICK NEURO 3.0 LAGG (BURR) ×3 IMPLANT
BUR PRECISION FLUTE 6.0 (BURR) ×3 IMPLANT
CANISTER SUCT 3000ML PPV (MISCELLANEOUS) ×3 IMPLANT
CAP LOCKING REVERE (Cap) ×18 IMPLANT
CLOSURE WOUND 1/2 X4 (GAUZE/BANDAGES/DRESSINGS) ×1
CONT SPEC 4OZ CLIKSEAL STRL BL (MISCELLANEOUS) ×3 IMPLANT
COVER BACK TABLE 60X90IN (DRAPES) ×3 IMPLANT
DECANTER SPIKE VIAL GLASS SM (MISCELLANEOUS) ×3 IMPLANT
DRAPE C-ARM 42X72 X-RAY (DRAPES) ×3 IMPLANT
DRAPE C-ARMOR (DRAPES) ×3 IMPLANT
DRAPE LAPAROTOMY 100X72X124 (DRAPES) ×3 IMPLANT
DRAPE POUCH INSTRU U-SHP 10X18 (DRAPES) ×3 IMPLANT
DRAPE PROXIMA HALF (DRAPES) IMPLANT
DRAPE SURG 17X23 STRL (DRAPES) ×3 IMPLANT
DRSG OPSITE 4X5.5 SM (GAUZE/BANDAGES/DRESSINGS) ×3 IMPLANT
DRSG OPSITE POSTOP 4X6 (GAUZE/BANDAGES/DRESSINGS) ×3 IMPLANT
DURAPREP 26ML APPLICATOR (WOUND CARE) ×3 IMPLANT
ELECT REM PT RETURN 9FT ADLT (ELECTROSURGICAL) ×3
ELECTRODE REM PT RTRN 9FT ADLT (ELECTROSURGICAL) ×1 IMPLANT
EVACUATOR 3/16  PVC DRAIN (DRAIN) ×2
EVACUATOR 3/16 PVC DRAIN (DRAIN) ×1 IMPLANT
GAUZE SPONGE 4X4 12PLY STRL (GAUZE/BANDAGES/DRESSINGS) ×3 IMPLANT
GAUZE SPONGE 4X4 16PLY XRAY LF (GAUZE/BANDAGES/DRESSINGS) IMPLANT
GLOVE BIO SURGEON STRL SZ8 (GLOVE) ×6 IMPLANT
GLOVE BIOGEL PI IND STRL 7.5 (GLOVE) ×2 IMPLANT
GLOVE BIOGEL PI IND STRL 8 (GLOVE) ×3 IMPLANT
GLOVE BIOGEL PI INDICATOR 7.5 (GLOVE) ×4
GLOVE BIOGEL PI INDICATOR 8 (GLOVE) ×6
GLOVE ECLIPSE 7.5 STRL STRAW (GLOVE) ×15 IMPLANT
GLOVE EXAM NITRILE LRG STRL (GLOVE) IMPLANT
GLOVE EXAM NITRILE MD LF STRL (GLOVE) IMPLANT
GLOVE EXAM NITRILE XL STR (GLOVE) IMPLANT
GLOVE EXAM NITRILE XS STR PU (GLOVE) IMPLANT
GLOVE INDICATOR 8.5 STRL (GLOVE) ×6 IMPLANT
GLOVE SS BIOGEL STRL SZ 7 (GLOVE) ×1 IMPLANT
GLOVE SUPERSENSE BIOGEL SZ 7 (GLOVE) ×2
GOWN STRL REUS W/ TWL LRG LVL3 (GOWN DISPOSABLE) ×1 IMPLANT
GOWN STRL REUS W/ TWL XL LVL3 (GOWN DISPOSABLE) ×2 IMPLANT
GOWN STRL REUS W/TWL 2XL LVL3 (GOWN DISPOSABLE) ×6 IMPLANT
GOWN STRL REUS W/TWL LRG LVL3 (GOWN DISPOSABLE) ×2
GOWN STRL REUS W/TWL XL LVL3 (GOWN DISPOSABLE) ×4
KIT BASIN OR (CUSTOM PROCEDURE TRAY) ×3 IMPLANT
KIT INFUSE XX SMALL 0.7CC (Orthopedic Implant) ×3 IMPLANT
KIT ROOM TURNOVER OR (KITS) ×3 IMPLANT
LIQUID BAND (GAUZE/BANDAGES/DRESSINGS) ×3 IMPLANT
NEEDLE HYPO 25X1 1.5 SAFETY (NEEDLE) ×3 IMPLANT
NS IRRIG 1000ML POUR BTL (IV SOLUTION) ×3 IMPLANT
PACK LAMINECTOMY NEURO (CUSTOM PROCEDURE TRAY) ×3 IMPLANT
PAD ARMBOARD 7.5X6 YLW CONV (MISCELLANEOUS) ×9 IMPLANT
ROD CURVED 5.5MMX75MM (Rod) ×6 IMPLANT
SCREW PEDICLE 6.5MMX45MM (Screw) ×6 IMPLANT
SCREW PEDICLE 6.5X45 (Screw) ×2 IMPLANT
SPACER RISE 10X22 8-14MM-10 (Spacer) ×6 IMPLANT
SPACER RISE 10X22 9-15MM (Spacer) ×6 IMPLANT
SPONGE LAP 4X18 X RAY DECT (DISPOSABLE) IMPLANT
SPONGE SURGIFOAM ABS GEL 100 (HEMOSTASIS) ×3 IMPLANT
STRIP CLOSURE SKIN 1/2X4 (GAUZE/BANDAGES/DRESSINGS) ×2 IMPLANT
SUT VIC AB 0 CT1 18XCR BRD8 (SUTURE) ×2 IMPLANT
SUT VIC AB 0 CT1 8-18 (SUTURE) ×4
SUT VIC AB 2-0 CT1 18 (SUTURE) ×3 IMPLANT
SUT VIC AB 4-0 PS2 27 (SUTURE) ×3 IMPLANT
TOWEL OR 17X24 6PK STRL BLUE (TOWEL DISPOSABLE) ×3 IMPLANT
TOWEL OR 17X26 10 PK STRL BLUE (TOWEL DISPOSABLE) ×3 IMPLANT
TRAY FOLEY W/METER SILVER 14FR (SET/KITS/TRAYS/PACK) ×3 IMPLANT
WATER STERILE IRR 1000ML POUR (IV SOLUTION) ×3 IMPLANT

## 2015-10-04 NOTE — Anesthesia Procedure Notes (Signed)
Procedure Name: Intubation Date/Time: 10/04/2015 7:37 AM Performed by: Susa Loffler Pre-anesthesia Checklist: Patient identified, Timeout performed, Emergency Drugs available, Suction available and Patient being monitored Patient Re-evaluated:Patient Re-evaluated prior to inductionOxygen Delivery Method: Circle system utilized Preoxygenation: Pre-oxygenation with 100% oxygen Intubation Type: IV induction Ventilation: Mask ventilation without difficulty and Oral airway inserted - appropriate to patient size Laryngoscope Size: Mac and 3 Grade View: Grade II Tube type: Oral Tube size: 7.0 mm Number of attempts: 1 Airway Equipment and Method: Stylet and Oral airway Placement Confirmation: ETT inserted through vocal cords under direct vision,  positive ETCO2 and breath sounds checked- equal and bilateral Secured at: 21 cm Tube secured with: Tape Dental Injury: Teeth and Oropharynx as per pre-operative assessment

## 2015-10-04 NOTE — OR Nursing (Signed)
Pt c/o eyes hurting and she is unable to open them. She states she is being treated for an eye infection starting 2 weeks ago. Sclera are pink. Eyes gently wiped with warm cloth, Dr. Kalman Shan aware.

## 2015-10-04 NOTE — Op Note (Signed)
Preoperative diagnosis: Herniated nucleus with stenosis L3-4 lumbar spinal stenosis L3-4 severe foraminal stenosis L3-L4  Postoperative diagnosis: Same  Procedure: #1 expiration of fusion removal of hardware L4-5  #2 decompressive lumbar laminectomy L3-4 and excess and requiring more work than would be needed with a standard interbody fusion with complete facetectomies radical foraminotomies of the L3 and L4 nerve roots.  #3 posterior lumbar interbody fusion using the globus rise expandable cage system packed with locally harvested autograft mixed withn allostem and BMP  #4 pedicle screw fixation L3-5 utilizing the globus Revere pedicle screw system  #5 posterior lateral arthrodesis L3-4 using locally harvested autograft mixed  Surgeon: Dominica Severin Deleah Tison  Asst.: Marland Kitchen ditty  Anesthesia: Gen.  EBL: Minimal  History of present illness: Patient is a very pleasant 64 year old female is a progress worsening back and bilateral leg pain and last several weeks and months patient previous he had an L4-5 fusion and did very well however she is failed all forms of conservative treatment over the week most recent exacerbation which included a herniated disc at L3-4 on the right severe progressive degenerative collapse spinal stenosis and instability. Due to patient's failure conservative treatment imaging findings and progressive clinical syndrome I recommended a decompression fusion L3-4. I extensively went over the risks and benefits of the operation with the patient as well as perioperative course expectations of outcome and alternatives surgery and she understood and agreed to proceed forward.  Operative procedure: Patient brought into the or was induced under general anesthesia positioned prone on the [facility] table back was prepped and draped in routine sterile fashion her old incision was identified and opened up and extended slightly cephalad after infiltration of 10 mL lidocaine with epi the scar  tissues dissected free and subperiosteal dissections care lamina of L2 and L3 exposing the hardware at L4-5. Spinous processes at L3 was then removed central decompression was begun working through the scar and freeing up the medial facet complex complete medial facetectomies were performed a radical foraminotomies of the L3 and L4 nerve root. There is a dense amount of scar tissue and severe stenosis in hourglass fashion to marked facet arthropathy. The facets were also noted be markedly diastased and incompetent. After the complete facetectomy was performed aggressive abutting of the superior tickling facet at L4 was carried out to gain access lateral margins disc space. At this point the epidural veins Kogut disc spaces cleanout bilaterally using sequential distraction I left a 10 distractor in on the left side. The endplates and inserted an 8-14 expandable cage on the right. Open his approximate returns to approximately 9-9 a half millimeters a significant opened up the disc space and decompress the L3 foramen. Then working on the contralateral side and central disc was removed endplates were prepared again local autograft packed centrally along with a small BMP sponge and the contralateral cage was inserted. Then the pedicle screws were placed under fluoroscopy pilot holes were drilled at L3 the pedicles were cannulated with the awl probed O55 Probed again and 6 5 x 45 screws inserted bilaterally at L3. Then posterior fluoroscopy confirmed good position of all the implants. I inspected the fusion at L4-5 it did appear to be solid either disconnect the knots in the rods I left the screws at L4 and L5 behind and inserted 80 mm rod connecting up to L3. All the top tightening nuts and torqued in place. Then with adequate exposure of the L3 and L4 TPs as well as a lateral fusion L4-5 aggressively  decorticated this placed BMP sponges and autograft posterior laterally from L3 down to the TPS L4 and extending into the  lateral fusion at L4-5. Who was an copious irrigated meticulous he states was maintained Gelfoam was opened up the dura after reinspecting all foraminal to confirm patency a large director was placed the wounds closed in layers after Vicryl skin is closed running 4 subcuticular Dermabond benzo and Steri-Strips and sterile dressing applied patient recovered in stable condition. At the end the case all needle counts sponge counts were correct.

## 2015-10-04 NOTE — Transfer of Care (Signed)
Immediate Anesthesia Transfer of Care Note  Patient: [name] [name]  Procedure(s) Performed: Procedure(s) with comments: Lumbar three-four Posterior lumbar interbody fusion, Exploration of Hardware Lumbar four-five, hardware removal  (N/A) - L3-4 Posterior lumbar interbody fusion  Patient Location: PACU  Anesthesia Type:General  Level of Consciousness: awake, alert  and oriented  Airway & Oxygen Therapy: Patient Spontanous Breathing and Patient connected to nasal cannula oxygen  Post-op Assessment: Report given to RN, Post -op Vital signs reviewed and stable and Patient moving all extremities X 4  Post vital signs: Reviewed and stable  Last Vitals:  Filed Vitals:   10/04/15 0608  BP: 123/46  Pulse:   Temp:   Resp:     Complications: No apparent anesthesia complications

## 2015-10-04 NOTE — H&P (Signed)
[name] [name] is an 64 y.o. female.   Chief Complaint: Back and bilateral leg pain HPI: Patient is a very pleasant 64 year old female is a progress worsening back and bilateral leg pain worse on the right the last several months. Workup has revealed an acute disc herniation at L3-4 above the level of a previous L4-5 fusion. With severe spinal stenosis and evidence of instability. Due to patient's failed conservative treatment imaging findings and progression of clinical syndrome I recommended decompression and fusion at L3-4 with exploration of fusion removal of hardware L4-5 I extensively went over the risks and benefits of the procedure with the patient as well as perioperative course expectations of outcome and alternatives of surgery and she understands and agrees to proceed forward.  Past Medical History  Diagnosis Date  . Hypertension   . Depression   . Anxiety   . H/O hiatal hernia   . Arthritis   . Cellulitis and abscess of leg     last yr  . SVT (supraventricular tachycardia) ([place])     on pt's medical record from PCP - pt doesn't remember  . Sleep apnea     does not use Cpap - couldn't keep mask on.  . Hyperlipidemia   . Abnormal kidney function     with extremity swelling  . Seizures ([place])      x 2 with high fever as an adult  . Neuropathy ([place])   . Cancer ([place])     skin cancer  . Low iron     after a miscarriage  . Pancreatitis   . GERD (gastroesophageal reflux disease)   . Headache     Tension headaches  . Chronic pain     Past Surgical History  Procedure Laterality Date  . Cesarean section      x 3  . Hernia repair    . Abdominal hysterectomy    . Gastric bypass      minigastric bypass 2006 in [facility]  . Joint replacement      bil knee  . Tonsillectomy    . Back surgery      02/06/2013  . Breath tek h pylori N/A 06/18/2014    Procedure: BREATH TEK H PYLORI;  Surgeon: Pedro Earls, MD;  Location: [facility];  Service: General;  Laterality: N/A;   . Eye surgery Bilateral     cataract surgery  . Laparoscopic bilateral salpingo oopherectomy      Family History  Problem Relation Age of Onset  . COPD Mother   . Cancer Mother   . Hypertension Sister   . Hyperlipidemia Sister   . Obesity Sister   . Heart failure Father    Social History:  reports that she has never smoked. She has never used smokeless tobacco. She reports that she drinks alcohol. She reports that she does not use illicit drugs.  Allergies:  Allergies  Allergen Reactions  . Bee Venom Anaphylaxis  . Erythrocin [Erythromycin]     Unknown reaction  . Fioricet [Butalbital-Apap-Caffeine] Other (See Comments)    dizziness  . Other     Barbiturates-reaction unknown.  Marland Kitchen Penicillins Other (See Comments)    Reaction unknown  . Clindamycin/Lincomycin Rash  . Hydrocodone-Acetaminophen Rash  . Morphine And Related Rash  . Phenobarbital Rash  . Teflaro [Ceftaroline] Rash  . Vancomycin Rash    Medications Prior to Admission  Medication Sig Dispense Refill  . bumetanide (BUMEX) 1 MG tablet Take 1 mg by mouth 2 (two) times daily.    Marland Kitchen  doxepin (SINEQUAN) 25 MG capsule Take 25 mg by mouth 2 (two) times daily.    . DULoxetine (CYMBALTA) 60 MG capsule Take 60 mg by mouth 2 (two) times daily.    Marland Kitchen erythromycin ophthalmic ointment Place 1 application into both eyes at bedtime.    Marland Kitchen esomeprazole (NEXIUM) 20 MG capsule Take 20 mg by mouth 2 (two) times daily before a meal.     . loperamide (IMODIUM) 2 MG capsule Take 2 mg by mouth daily as needed for diarrhea or loose stools.    . magnesium oxide (MAG-OX) 400 MG tablet Take 400 mg by mouth daily.    . metFORMIN (GLUCOPHAGE) 500 MG tablet Take 500 mg by mouth daily with breakfast.    . metolazone (ZAROXOLYN) 2.5 MG tablet Take 2.5 mg by mouth daily.    . metoprolol succinate (TOPROL-XL) 50 MG 24 hr tablet Take 50 mg by mouth 2 (two) times daily. Take with or immediately following a meal.    . metroNIDAZOLE (METROGEL) 1 % gel  Apply 1 application topically 2 (two) times daily.    . Multiple Vitamin (MULTIVITAMIN WITH MINERALS) TABS Take 1 tablet by mouth 2 (two) times daily.    Marland Kitchen ofloxacin (OCUFLOX) 0.3 % ophthalmic solution Place 1 drop into both eyes 4 (four) times daily.  0  . ondansetron (ZOFRAN) 8 MG tablet Take 8 mg by mouth every 8 (eight) hours as needed for nausea or vomiting.    . OPTIVE 0.5-0.9 % SOLN Place 1 drop into both eyes daily as needed (dry eyes).   0  . oxyCODONE-acetaminophen (PERCOCET/ROXICET) 5-325 MG per tablet Take 1 tablet by mouth every 6 (six) hours as needed for pain.    . polyethylene glycol (MIRALAX / GLYCOLAX) packet Take 17 g by mouth daily.    . potassium chloride (MICRO-K) 10 MEQ CR capsule Take 10 mEq by mouth 2 (two) times daily.    Marland Kitchen spironolactone (ALDACTONE) 50 MG tablet Take 50 mg by mouth 2 (two) times daily.     Marland Kitchen triamcinolone cream (KENALOG) 0.1 % Apply 1 application topically 2 (two) times daily as needed (affected area).    Marland Kitchen allopurinol (ZYLOPRIM) 300 MG tablet Take 300 mg by mouth daily.    . calcium carbonate (TUMS - DOSED IN MG ELEMENTAL CALCIUM) 500 MG chewable tablet Chew 1 tablet by mouth daily as needed for indigestion or heartburn.     . colchicine 0.6 MG tablet Take 0.6 mg by mouth daily.    Marland Kitchen zolpidem (AMBIEN) 10 MG tablet Take 5 mg by mouth at bedtime.       Results for orders placed or performed during the hospital encounter of 10/04/15 (from the past 48 hour(s))  Glucose, capillary     Status: Abnormal   Collection Time: 10/04/15  6:15 AM  Result Value Ref Range   Glucose-Capillary 123 (H) 65 - 99 mg/dL   No results found.  Review of Systems  Constitutional: Negative.   HENT: Negative.   Eyes: Negative.   Respiratory: Negative.   Cardiovascular: Negative.   Gastrointestinal: Negative.   Genitourinary: Negative.   Musculoskeletal: Positive for myalgias, back pain and joint pain.  Skin: Negative.   Neurological: Positive for dizziness, tingling  and tremors.  Endo/Heme/Allergies: Negative.   Psychiatric/Behavioral: Negative.     Blood pressure 123/46, pulse 77, temperature 98.5 F (36.9 C), temperature source Oral, resp. rate 20, height 5' (1.524 m), weight 83.462 kg (184 lb), SpO2 100 %. Physical Exam  Constitutional: She  is oriented to person, place, and time. She appears well-developed and well-nourished.  HENT:  Head: Normocephalic.  Eyes: Pupils are equal, round, and reactive to light.  Neck: Normal range of motion.  Respiratory: Effort normal.  GI: Soft.  Neurological: She is alert and oriented to person, place, and time. She has normal strength. GCS eye subscore is 4. GCS verbal subscore is 5. GCS motor subscore is 6.  Strength is 5 out of 5 in her iliopsoas, quads, hamstrings, gastric, and tibialis, and EHL.  Skin: Skin is warm and dry.     Assessment/Plan 64 year old female presents for decompression and fusion at L3-4.  Solange Emry P 10/04/2015, 7:07 AM

## 2015-10-04 NOTE — Progress Notes (Signed)
Patient admitted to the unit via PACU. Patient alert and oriented x 4. Patient made comfortable. Family at the bed side.

## 2015-10-04 NOTE — Progress Notes (Signed)
Utilization review completed.  

## 2015-10-04 NOTE — Anesthesia Preprocedure Evaluation (Addendum)
Anesthesia Evaluation  Patient identified by MRN, date of birth, ID band Patient awake    Reviewed: Allergy & Precautions, NPO status , Patient's Chart, lab work & pertinent test results  Airway Mallampati: II  TM Distance: >3 FB Neck ROM: Full    Dental no notable dental hx.    Pulmonary sleep apnea ,    Pulmonary exam normal breath sounds clear to auscultation       Cardiovascular hypertension, Pt. on medications and Pt. on home beta blockers Normal cardiovascular exam Rhythm:Regular Rate:Normal     Neuro/Psych Seizures -,  Anxiety Depression negative neurological ROS  negative psych ROS   GI/Hepatic negative GI ROS, Neg liver ROS, hiatal hernia, GERD  Medicated,  Endo/Other  negative endocrine ROSdiabetes, Type 2, Oral Hypoglycemic AgentsMorbid obesity  Renal/GU negative Renal ROS  negative genitourinary   Musculoskeletal negative musculoskeletal ROS (+) Arthritis ,   Abdominal   Peds negative pediatric ROS (+)  Hematology negative hematology ROS (+)   Anesthesia Other Findings   Reproductive/Obstetrics negative OB ROS                           Anesthesia Physical Anesthesia Plan  ASA: II  Anesthesia Plan: General   Post-op Pain Management:    Induction: Intravenous  Airway Management Planned: Oral ETT  Additional Equipment:   Intra-op Plan:   Post-operative Plan: Extubation in OR  Informed Consent: I have reviewed the patients History and Physical, chart, labs and discussed the procedure including the risks, benefits and alternatives for the proposed anesthesia with the patient or authorized representative who has indicated his/her understanding and acceptance.   Dental advisory given  Plan Discussed with: CRNA and Surgeon  Anesthesia Plan Comments:         Anesthesia Quick Evaluation

## 2015-10-04 NOTE — Anesthesia Postprocedure Evaluation (Signed)
  Anesthesia Post-op Note  Patient: [name] [name]  Procedure(s) Performed: Procedure(s) (LRB): Lumbar three-four Posterior lumbar interbody fusion, Exploration of Hardware Lumbar four-five, hardware removal  (N/A)  Patient Location: PACU  Anesthesia Type: General  Level of Consciousness: awake and alert   Airway and Oxygen Therapy: Patient Spontanous Breathing  Post-op Pain: mild  Post-op Assessment: Post-op Vital signs reviewed, Patient's Cardiovascular Status Stable, Respiratory Function Stable, Patent Airway and No signs of Nausea or vomiting  Last Vitals:  Filed Vitals:   10/04/15 1145  BP:   Pulse: 85  Temp:   Resp: 20    Post-op Vital Signs: stable   Complications: No apparent anesthesia complications

## 2015-10-05 LAB — GLUCOSE, CAPILLARY
GLUCOSE-CAPILLARY: 129 mg/dL — AB (ref 65–99)
Glucose-Capillary: 96 mg/dL (ref 65–99)
Glucose-Capillary: 131 mg/dL — ABNORMAL HIGH (ref 65–99)
Glucose-Capillary: 135 mg/dL — ABNORMAL HIGH (ref 65–99)

## 2015-10-05 NOTE — Progress Notes (Signed)
Physical Therapy Evaluation Patient Details Name: [name] [name] MRN: [id number] DOB: Jun 07, 1951 Today's Date: 10/05/2015   History of Present Illness  64 yo female with herniated nucleus with stenosis L3-4 lumbar spinal stenosis L3-4 severe foraminal stenosis L3-L4. Prior L4-5 surgery 2 years ago.  Clinical Impression  Patient presents with post surgical pain and mild confusion and/or difficulty with recall of precautions and following directions.  Likely due to post surgical status, anticipate resolution with time.  Patient required MOD assist overall, with increased cues and MAX assist for safety and specific bed mobility techniques.  Pain is limiting factor, endurance once standing is good.  Patient will benefit from continued PT services for bed mobility, transfers, safety precaution training, and to assess steps to enter home.  Anticipate improved safety with time and training with eventual return home.    Follow Up Recommendations Supervision/Assistance - 24 hour;Home health PT    Equipment Recommendations  None recommended by PT    Recommendations for Other Services       Precautions / Restrictions Precautions Precautions: Back Precaution Booklet Issued: Yes (comment) Required Braces or Orthoses: Spinal Brace Spinal Brace: Lumbar corset;Applied in sitting position Restrictions Weight Bearing Restrictions: No      Mobility  Bed Mobility Overal bed mobility: Needs Assistance Bed Mobility: Sidelying to Sit;Rolling;Sit to Sidelying Rolling: Min assist Sidelying to sit: Mod assist     Sit to sidelying: Max assist General bed mobility comments: Cues for log roll technique, Needs increased assist for getting back into bed.  Transfers Overall transfer level: Needs assistance Equipment used: Rolling walker (2 wheeled) Transfers: Sit to/from Stand Sit to Stand: Min assist         General transfer comment: Increased assist due to mulitple lines/drains in addition to  physical assist.  Ambulation/Gait Ambulation/Gait assistance: Min assist Ambulation Distance (Feet): 120 Feet Assistive device: Rolling walker (2 wheeled) Gait Pattern/deviations: Step-through pattern;Decreased stride length   Gait velocity interpretation: Below normal speed for age/gender General Gait Details: Slower pace, mild unsteadiness, related to acute pain.  Stairs            Wheelchair Mobility    Modified Rankin (Stroke Patients Only)       Balance                                             Pertinent Vitals/Pain Pain Assessment: 0-10 Pain Score: 4  Pain Location: Low back and buttocks Pain Descriptors / Indicators: Aching Pain Intervention(s): Limited activity within patient's tolerance;Monitored during session;Premedicated before session    Home Living Family/patient expects to be discharged to:: Private residence Living Arrangements: Alone Available Help at Discharge: Family;Available PRN/intermittently Type of Home: House Home Access: Stairs to enter Entrance Stairs-Rails: Right Entrance Stairs-Number of Steps: 2 Home Layout: One level Home Equipment: Walker - 2 wheels;Cane - single point Additional Comments: Has equipment from prior surgeries (back and B knees).    Prior Function Level of Independence: Independent         Comments: Was not using assistive devices prior to surgery.     Hand Dominance        Extremity/Trunk Assessment   Upper Extremity Assessment: Overall WFL for tasks assessed;Defer to OT evaluation           Lower Extremity Assessment: Overall [facility] for tasks assessed;Generalized weakness         Communication  Communication: No difficulties  Cognition Arousal/Alertness: Lethargic;Suspect due to medications Behavior During Therapy: Flat affect Overall Cognitive Status: Difficult to assess       Memory: Decreased recall of precautions              General Comments General  comments (skin integrity, edema, etc.): Altered level of alert, mild confusion and decreased following commands, likely due to medication or anesthesia.  Requires increased assist.    Exercises General Exercises - Lower Extremity Ankle Circles/Pumps: AROM;Both;20 reps Long Arc Quad: AROM;Both;5 reps;Seated Heel Slides: AROM;Both;10 reps;Supine Hip Flexion/Marching: AROM;Both;10 reps;Seated      Assessment/Plan    PT Assessment Patient needs continued PT services  PT Diagnosis Difficulty walking;Acute pain   PT Problem List Decreased strength;Decreased activity tolerance;Decreased balance;Decreased mobility;Decreased safety awareness;Decreased knowledge of precautions;Pain  PT Treatment Interventions Gait training;Stair training;Functional mobility training;DME instruction;Therapeutic activities;Therapeutic exercise;Patient/family education   PT Goals (Current goals can be found in the Care Plan section) Acute Rehab PT Goals Patient Stated Goal: Not stated, to stop hurting. PT Goal Formulation: With patient Time For Goal Achievement: 10/19/15 Potential to Achieve Goals: Good    Frequency Min 5X/week   Barriers to discharge        Co-evaluation               End of Session Equipment Utilized During Treatment: Gait belt;Back brace Activity Tolerance: Patient tolerated treatment well Patient left: in bed;with call bell/phone within reach;with bed alarm set Nurse Communication: Mobility status;Precautions         Time: 1020-1100 PT Time Calculation (min) (ACUTE ONLY): 40 min   Charges:   PT Evaluation $Initial PT Evaluation Tier I: 1 Procedure PT Treatments $Gait Training: 8-22 mins $Therapeutic Activity: 8-22 mins   PT G Codes:        Samiyyah Moffa L Oct 12, 2015, 11:40 AM

## 2015-10-05 NOTE — Evaluation (Signed)
Occupational Therapy Evaluation Patient Details Name: [name] [name] MRN: [id number] DOB: November 03, 1951 Today's Date: 10/05/2015    History of Present Illness 64 y.o. female with herniated nucleus with stenosis L3-4 lumbar spinal stenosis L3-4 severe foraminal stenosis L3-L4. Prior L4-5 surgery 2 years ago.   Clinical Impression   Pt s/p above. Pt independent with ADLs, PTA. Feel pt will benefit from acute OT to increase independence prior to d/c. Recommending HHOT and 24/7 supervision/assist at this time.    Follow Up Recommendations  Home health OT;Supervision/Assistance - 24 hour    Equipment Recommendations  None recommended by OT    Recommendations for Other Services       Precautions / Restrictions Precautions Precautions: Back Precaution Booklet Issued: No Required Braces or Orthoses: Spinal Brace Spinal Brace: Lumbar corset;Applied in sitting position (adjusted in standing) Restrictions Weight Bearing Restrictions: No      Mobility Bed Mobility Overal bed mobility: Needs Assistance Bed Mobility: Rolling;Sidelying to Sit Rolling: Supervision (tactile cues given) Sidelying to sit: Min assist     General bed mobility comments: cues for technique  Transfers Overall transfer level: Needs assistance Equipment used: Rolling walker (2 wheeled) Transfers: Sit to/from [employer] Sit to Stand: Min guard Stand pivot transfers: Min assist          Balance      Balance not formally assessed.                                       ADL Overall ADL's : Needs assistance/impaired Eating/Feeding: Independent;Sitting               Upper Body Dressing : Moderate assistance;Sitting   Lower Body Dressing: Maximal assistance;Sit to/from stand   Toilet Transfer: Minimal assistance;RW (stand pivot from bed to chair)           Functional mobility during ADLs: Minimal assistance;Rolling walker (stand pivot to chair)        Vision     Perception     Praxis      Pertinent Vitals/Pain Pain Assessment: 0-10 Pain Score: 7  Pain Location: back Pain Intervention(s): Monitored during session;Repositioned     Hand Dominance     Extremity/Trunk Assessment Upper Extremity Assessment Upper Extremity Assessment: Overall WFL for tasks assessed   Lower Extremity Assessment Lower Extremity Assessment: Defer to PT evaluation       Communication Communication Communication: No difficulties   Cognition Arousal/Alertness: Lethargic;Suspect due to medications Behavior During Therapy: Flat affect Overall Cognitive Status: No family/caregiver present to determine baseline cognitive functioning                   General Comments          Shoulder Instructions      Home Living Family/patient expects to be discharged to:: Private residence Living Arrangements: Alone Available Help at Discharge: Family;caregiver; Available PRN/intermittently (pt reports caregiver is there 7x a week during the day) Type of Home: House Home Access: Stairs to enter [employer] of Steps: 2 Entrance Stairs-Rails: Right Home Layout: One level               Home Equipment: Walker - 2 wheels;Cane - single point;Bedside commode;Adaptive equipment Adaptive Equipment:  (unsure what all AE she has) Additional Comments: Has equipment from prior surgeries (back and B knees).      Prior Functioning/Environment Level of Independence: Needs assistance  ADLs/IADls:  Caregiver assists with running pt's errands and cleaning        Comments: Was not using assistive devices prior to surgery.    OT Diagnosis: Acute pain   OT Problem List: Decreased cognition;Decreased knowledge of use of DME or AE;Decreased knowledge of precautions;Pain;Decreased activity tolerance;Decreased range of motion   OT Treatment/Interventions: Therapeutic activities;Cognitive remediation/compensation;Patient/family  education;Balance training;Self-care/ADL training;DME and/or AE instruction    OT Goals(Current goals can be found in the care plan section) Acute Rehab OT Goals Patient Stated Goal: not stated OT Goal Formulation: With patient Time For Goal Achievement: 10/12/15 Potential to Achieve Goals: Good ADL Goals Pt Will Perform Grooming: with set-up;with supervision;standing Pt Will Perform Lower Body Bathing: with set-up;with supervision;with adaptive equipment;sit to/from stand Pt Will Perform Lower Body Dressing: with set-up;with supervision;with adaptive equipment;sit to/from stand Pt Will Transfer to Toilet: with supervision;ambulating Pt Will Perform Toileting - Clothing Manipulation and hygiene: with supervision;sit to/from stand Additional ADL Goal #1: Pt will independently verbalize 3/3 back precautions and maintain during session.   OT Frequency: Min 2X/week   Barriers to D/C:            Co-evaluation              End of Session Equipment Utilized During Treatment: Gait belt;Back brace;Rolling walker Nurse Communication: Other (comment) (pt in chair; asked her to get chair alarm)  Activity Tolerance:  (pt in pain and lethargic) Patient left: in chair;with call bell/phone within reach   Time: 1311-1328 OT Time Calculation (min): 17 min Charges:  OT General Charges $OT Visit: 1 Procedure OT Evaluation $Initial OT Evaluation Tier I: 1 Procedure G-CodesBenito Mccreedy OTR/L [id number] 10/05/2015, 1:44 PM

## 2015-10-05 NOTE — Progress Notes (Signed)
Patient ID: [name] [name], female   DOB: [birth year], 64 y.o.   MRN: [id number] Afeb, vss No new neuro issues. Complains of a lot of incisional pain.  Family thinks she will need a snf, so will get that started. Needs to start to mobilize.

## 2015-10-06 LAB — GLUCOSE, CAPILLARY
GLUCOSE-CAPILLARY: 127 mg/dL — AB (ref 65–99)
GLUCOSE-CAPILLARY: 156 mg/dL — AB (ref 65–99)
Glucose-Capillary: 170 mg/dL — ABNORMAL HIGH (ref 65–99)

## 2015-10-06 MED ORDER — TRAMADOL HCL 50 MG PO TABS
50.0000 mg | ORAL_TABLET | Freq: Four times a day (QID) | ORAL | Status: DC | PRN
  Administered 2015-10-06 – 2015-10-14 (×17): 50 mg via ORAL
  Filled 2015-10-06 (×17): qty 1

## 2015-10-06 NOTE — Progress Notes (Signed)
Occupational Therapy Treatment Patient Details Name: [name] [name] MRN: [id number] DOB: November 17, 1951 Today's Date: 10/06/2015    History of present illness 64 y.o. female with herniated nucleus with stenosis L3-4 lumbar spinal stenosis L3-4 severe foraminal stenosis L3-L4. Prior L4-5 surgery 2 years ago.   OT comments  Pt. Making gains with acute OT goals.  Able to complete grooming task in standing, toileting, and shower stall transfer. Will continue to follow acutely.    Follow Up Recommendations  Home health OT;Supervision/Assistance - 24 hour    Equipment Recommendations  None recommended by OT    Recommendations for Other Services      Precautions / Restrictions Precautions Precautions: Back Required Braces or Orthoses: Spinal Brace Spinal Brace: Lumbar corset;Applied in sitting position       Mobility Bed Mobility Overal bed mobility: Needs Assistance Bed Mobility: Rolling;Sidelying to Sit;Sit to Sidelying Rolling: Min assist Sidelying to sit: Min assist     Sit to sidelying: Mod assist General bed mobility comments: cues for sequencing & reinforcement of back precautions.  (A) to maintain log rolling, insistent on using rails states she has bed rails on her bed at home from previous sx.  Transfers Overall transfer level: Needs assistance Equipment used: Rolling walker (2 wheeled) Transfers: Sit to/from [facility] Sit to Stand: Min guard Stand pivot transfers: Min assist       General transfer comment: cues for hand placement    Balance                                   ADL Overall ADL's : Needs assistance/impaired     Grooming: Wash/dry hands;Min guard;Standing Grooming Details (indicate cue type and reason): cues for walker placement at the counter         Upper Body Dressing : Moderate assistance;Sitting Upper Body Dressing Details (indicate cue type and reason): difficulty donning brace on her own     Toilet  Transfer: Minimal assistance;Ambulation;Regular Toilet;Grab bars;RW   Toileting- Clothing Manipulation and Hygiene: Supervision/safety;Sitting/lateral lean   Tub/ Banker: Gaffer;Anterior/posterior;Shower seat;Grab bars;Moderate assistance   Functional mobility during ADLs: Minimal assistance;Rolling walker General ADL Comments: pt. able to complete grooming, toileting, and shower stall transfer.  required max encouragement as she did think she was doing as well as she was with functional mobility.  reports she will have a sitter/aide at home or has option to stay with her son for a while      Vision                     Perception     Praxis      Cognition   Behavior During Therapy: Anxious Overall Cognitive Status: No family/caregiver present to determine baseline cognitive functioning                       Extremity/Trunk Assessment               Exercises     Shoulder Instructions       General Comments      Pertinent Vitals/ Pain       Pain Assessment: No/denies pain  Home Living  Prior Functioning/Environment              Frequency Min 2X/week     Progress Toward Goals  OT Goals(current goals can now be found in the care plan section)  Progress towards OT goals: Progressing toward goals     Plan Discharge plan remains appropriate    Co-evaluation                 End of Session Equipment Utilized During Treatment: Gait belt;Rolling walker;Back brace   Activity Tolerance Patient tolerated treatment well   Patient Left in bed;with call bell/phone within reach;with bed alarm set   Nurse Communication          Time: 9295-7473 OT Time Calculation (min): 30 min  Charges: OT General Charges $OT Visit: 1 Procedure OT Treatments $Self Care/Home Management : 23-37 mins  Janice Coffin, COTA/L 10/06/2015, 4:33 PM

## 2015-10-06 NOTE — Progress Notes (Addendum)
Continues to hallucinate with pain medications AVSS Full strength BLE Incision c/d/i Ambulating well Stable Will decrease pain medications Continue therapy  Drain output 100 for 24 hours, d/c drain

## 2015-10-06 NOTE — Progress Notes (Addendum)
Physical Therapy Treatment Patient Details Name: [name] [name] MRN: [id number] DOB: 1951-08-03 Today's Date: 10/06/2015   I have read and reviewed this note and the updated d/c recommendations.  Based on pt's continued pain, need for physical assist and human supervision for all mobility and considering pt will not have 24 care at d/c, recommend consult CSW to assist with SNF for postacute rehab needs.  Will continually reassess progress with therapy as medical status improves and update recommendations should pt demonstrate faster transition to functional independence than currently anticipated.  See note below for details of treatment session.    [name] [name], PT, co-signed for [name] [name], PTA    History of Present Illness 64 y.o. female with herniated nucleus with stenosis L3-4 lumbar spinal stenosis L3-4 severe foraminal stenosis L3-L4. Prior L4-5 surgery 2 years ago.    PT Comments    Pt agreeable to participate in PT session.  Required increased assistance for bed mobility today but transfers & ambulates with CGA.  Pt states she lives alone at home & may not have adequate assistance therefore may need to consider SNF if she does not progress to independent level.  Will cont to follow acutely & with POC to maximize mobility.      Follow Up Recommendations  Supervision/Assistance - 24 hour;Home health PT;SNF     Equipment Recommendations  None recommended by PT    Recommendations for Other Services       Precautions / Restrictions Precautions Precautions: Back Precaution Booklet Issued: No Required Braces or Orthoses: Spinal Brace Spinal Brace: Lumbar corset;Applied in sitting position Restrictions Weight Bearing Restrictions: No    Mobility  Bed Mobility Overal bed mobility: Needs Assistance Bed Mobility: Rolling;Sidelying to Sit;Sit to Sidelying Rolling: Min assist Sidelying to sit: Min assist     Sit to sidelying: Max assist General bed mobility  comments: cues for sequencing & reinforcement of back precautions.  (A) to maintain log rolling   Transfers Overall transfer level: Needs assistance Equipment used: Rolling walker (2 wheeled) Transfers: Sit to/from Stand Sit to Stand: Min guard         General transfer comment: cues for hand placement  Ambulation/Gait Ambulation/Gait assistance: Min guard Ambulation Distance (Feet): 150 Feet Assistive device: Rolling walker (2 wheeled) Gait Pattern/deviations: Step-through pattern;Decreased stride length Gait velocity: decreased   General Gait Details: cues to relax UE's; slow & guarded gait but steady   Science writer    Modified Rankin (Stroke Patients Only)       Balance                                    Cognition Arousal/Alertness: Awake/alert Behavior During Therapy: Flat affect Overall Cognitive Status: No family/caregiver present to determine baseline cognitive functioning       Memory: Decreased recall of precautions              Exercises      General Comments        Pertinent Vitals/Pain Pain Assessment: 0-10 Pain Score: 7  Pain Location: back Pain Descriptors / Indicators: Aching Pain Intervention(s): Monitored during session;Premedicated before session;Repositioned    Home Living                      Prior Function  PT Goals (current goals can now be found in the care plan section) Acute Rehab PT Goals Patient Stated Goal: not stated PT Goal Formulation: With patient Time For Goal Achievement: 10/19/15 Potential to Achieve Goals: Good    Frequency  Min 5X/week    PT Plan Current plan remains appropriate    Co-evaluation             End of Session Equipment Utilized During Treatment: Gait belt;Back brace Activity Tolerance: Patient tolerated treatment well Patient left: in bed;with call bell/phone within reach;with family/visitor present     Time:  1132-1150 PT Time Calculation (min) (ACUTE ONLY): 18 min  Charges:  $Gait Training: 8-22 mins                    G Codes:      Sena Hitch 10/06/2015, 1:02 PM   [name] [name], PTA [phone] 10/06/2015

## 2015-10-07 LAB — GLUCOSE, CAPILLARY
GLUCOSE-CAPILLARY: 142 mg/dL — AB (ref 65–99)
GLUCOSE-CAPILLARY: 141 mg/dL — AB (ref 65–99)
Glucose-Capillary: 107 mg/dL — ABNORMAL HIGH (ref 65–99)
Glucose-Capillary: 176 mg/dL — ABNORMAL HIGH (ref 65–99)

## 2015-10-07 MED ORDER — PANTOPRAZOLE SODIUM 40 MG PO TBEC
80.0000 mg | DELAYED_RELEASE_TABLET | Freq: Two times a day (BID) | ORAL | Status: DC
  Administered 2015-10-07 – 2015-10-11 (×9): 80 mg via ORAL
  Filled 2015-10-07 (×11): qty 2

## 2015-10-07 MED ORDER — DEXAMETHASONE SODIUM PHOSPHATE 10 MG/ML IJ SOLN
10.0000 mg | Freq: Four times a day (QID) | INTRAMUSCULAR | Status: AC
  Administered 2015-10-07 – 2015-10-08 (×4): 10 mg via INTRAVENOUS
  Filled 2015-10-07 (×3): qty 1

## 2015-10-07 NOTE — Progress Notes (Signed)
PT Cancellation Note  Patient Details Name: [name] [name] MRN: [id number] DOB: 09/21/1951   Cancelled Treatment:    Reason Eval/Treat Not Completed: Fatigue/lethargy limiting ability to participate; patient reports up to bathroom frequently and had bath this am.  Resting now and in pain in bilateral hips.  Wished to work with PT later today.  Will return later as time permits.   WYNN,[name] 10/07/2015, 11:17 AM  Magda Kiel, PT [phone] 10/07/2015

## 2015-10-07 NOTE — Care Management Important Message (Signed)
Important Message  Patient Details  Name: [name] [name] MRN: [id number] Date of Birth: Mar 24, 1951   Medicare Important Message Given:  Yes-second notification given    Loann Quill 10/07/2015, 1:39 PM

## 2015-10-07 NOTE — Progress Notes (Signed)
Physical Therapy Treatment Patient Details Name: [name] [name] MRN: [id number] DOB: 1951-02-01 Today's Date: 10/07/2015    History of Present Illness 64 y.o. female with herniated nucleus with stenosis L3-4 lumbar spinal stenosis L3-4 severe foraminal stenosis L3-L4. Prior L4-5 surgery 2 years ago.    PT Comments    Patient progressing with gait distance as well as able to negotiate stairs.  Still feel SNF rehab best plan as pt initially disoriented and needing cues for re-orientation and shaky throughout treatment.  Will continue to follow acutely.  Follow Up Recommendations  SNF     Equipment Recommendations  None recommended by PT    Recommendations for Other Services       Precautions / Restrictions Precautions Precautions: Back Precaution Comments: reviewed back precautions with patient, pt only able to verbalize 1/3 Required Braces or Orthoses: Spinal Brace Spinal Brace: Lumbar corset;Applied in sitting position Restrictions Weight Bearing Restrictions: No    Mobility  Bed Mobility Overal bed mobility: Needs Assistance Bed Mobility: Rolling;Sidelying to Sit Rolling: Supervision Sidelying to sit: Supervision     Sit to sidelying: Mod assist General bed mobility comments: use of rail to come upright, rail down and assist for feet in bed to sidelying, then supine with mod cues for positioning  Transfers Overall transfer level: Needs assistance Equipment used: Rolling walker (2 wheeled) Transfers: Sit to/from Stand Sit to Stand: Min assist         General transfer comment: assist for up from bed, and with cues from 3:1 in bathroom  Ambulation/Gait Ambulation/Gait assistance: Min guard;Min assist Ambulation Distance (Feet): 220 Feet Assistive device: Rolling walker (2 wheeled) Gait Pattern/deviations: Step-through pattern;Decreased stride length     General Gait Details: left hip ER and pt reports limited by neuropathy   Stairs Stairs:  Yes Stairs assistance: Mod assist Stair Management: Forwards;Sideways;Two rails;One rail Right Number of Stairs: 2 (x 2) General stair comments: initially up/down with two rails cues for sequence, then with right rail only due to rails at home too wide min/mod A for balance/safety/lifting up to step  Wheelchair Mobility    Modified Rankin (Stroke Patients Only)       Balance Overall balance assessment: Needs assistance Sitting-balance support: No upper extremity supported;Feet supported Sitting balance-Leahy Scale: Poor     Standing balance support: No upper extremity supported Standing balance-Leahy Scale: Fair Standing balance comment: washing hands at sink                    Cognition Arousal/Alertness: Awake/alert Behavior During Therapy: Anxious Overall Cognitive Status: No family/caregiver present to determine baseline cognitive functioning (initially disoriented, after re-orientation maintained level of orientation)       Memory: Decreased recall of precautions              Exercises      General Comments        Pertinent Vitals/Pain Pain Assessment: 0-10 Pain Score: 6  Pain Location: back and right hip>left Pain Descriptors / Indicators: Aching Pain Intervention(s): Repositioned;Monitored during session    Home Living                      Prior Function            PT Goals (current goals can now be found in the care plan section) Progress towards PT goals: Progressing toward goals    Frequency  Min 5X/week    PT Plan Discharge plan needs to be updated  Co-evaluation             End of Session Equipment Utilized During Treatment: Back brace Activity Tolerance: Patient limited by fatigue Patient left: in bed;with call bell/phone within reach     Time: 1540-1613 PT Time Calculation (min) (ACUTE ONLY): 33 min  Charges:  $Gait Training: 23-37 mins                    G Codes:      Aluel Schwarz,[name] 18-Oct-2015, 4:55  PM  Magda Kiel, [place] October 18, 2015

## 2015-10-07 NOTE — Progress Notes (Signed)
Attempted to ambulate patient to chair for breakfast this AM but patient refused. Repositioned in bed at this time for breakfast. Will continue to monitor.   Ave Filter, RN

## 2015-10-07 NOTE — Progress Notes (Signed)
Subjective: Patient reports A lot of back pain posterior thigh and calf pain  Objective: Vital signs in last 24 hours: Temp:  [98.2 F (36.8 C)-99 F (37.2 C)] 99 F (37.2 C) (11/07 0618) Pulse Rate:  [82-88] 85 (11/07 0618) Resp:  [18] 18 (11/07 0618) BP: (120-128)/(53-72) 128/58 mmHg (11/07 0618) SpO2:  [98 %-100 %] 100 % (11/07 0618)  Intake/Output from previous day:   Intake/Output this shift:    Strength out of 5 wound clean dry and intact  Lab Results: No results for input(s): WBC, HGB, HCT, PLT in the last 72 hours. BMET No results for input(s): NA, K, CL, CO2, GLUCOSE, BUN, CREATININE, CALCIUM in the last 72 hours.  Studies/Results: No results found.  Assessment/Plan: Mobilized day with physical occupational therapy Decadron for inflammation.  LOS: 3 days     Jearlean Demauro P 10/07/2015, 10:12 AM

## 2015-10-07 NOTE — Progress Notes (Signed)
Occupational Therapy Treatment Patient Details Name: [name] [name] MRN: [id number] DOB: 11/06/1951 Today's Date: 10/07/2015    History of present illness 64 y.o. female with herniated nucleus with stenosis L3-4 lumbar spinal stenosis L3-4 severe foraminal stenosis L3-L4. Prior L4-5 surgery 2 years ago.   OT comments  Patient making slow progress towards OT goals. D/C recommendation changed to SNF. Post acute d/c, pt will require 24/7 supervision/assistance for her safety and at this time, pt will not have consistent 24/7 supervision/assistance at home. Pt continues to be limited by increased pain, lethargy (suspect due to medications), and increased anxiety. She is overall min assist for mobility and requires mod assist to don/doff back brace. Discussed d/c recommendation change with patient's RN.    Follow Up Recommendations  SNF;Supervision/Assistance - 24 hour    Equipment Recommendations  Other (comment) (TBD next venue of care)    Recommendations for Other Services  None at this time   Precautions / Restrictions Precautions Precautions: Back Precaution Comments: reviewed back precautions with patient, pt only able to verbalise 1/3 Required Braces or Orthoses: Spinal Brace Spinal Brace: Lumbar corset;Applied in sitting position Restrictions Weight Bearing Restrictions: No    Mobility Bed Mobility Overal bed mobility: Needs Assistance Bed Mobility: Rolling;Sidelying to Sit Rolling: Supervision Sidelying to sit: Min assist       General bed mobility comments: Increased time, HOB down, and use of bed rails. Cueing needed to maintain back precautions.   Transfers Overall transfer level: Needs assistance Equipment used: Rolling walker (2 wheeled) Transfers: Sit to/from Stand Sit to Stand: Min assist General transfer comment: Cues for hand placement and min assist for safety and to power up    Balance Overall balance assessment: Needs assistance Sitting-balance  support: No upper extremity supported;Feet supported Sitting balance-Leahy Scale: Poor     Standing balance support: Bilateral upper extremity supported;During functional activity Standing balance-Leahy Scale: Fair     ADL Overall ADL's : Needs assistance/impaired Eating/Feeding: Independent;Sitting   Grooming: Wash/dry hands;Min guard;Standing   Upper Body Bathing: Min guard;Sitting   Lower Body Bathing: Minimal assistance;Sit to/from stand   Upper Body Dressing : Min guard;Sitting Upper Body Dressing Details (indicate cue type and reason): difficulty donning brace on her own, mod assist required for this Lower Body Dressing: Moderate assistance;Sit to/from stand Lower Body Dressing Details (indicate cue type and reason): pt reports she has AE (reacher, sock aid, LH sponge, LH shoe horn) at home         Tub/ Shower Transfer: Walk-in shower;Anterior/posterior;Shower seat;Grab bars;Moderate assistance Tub/Shower Transfer Details (indicate cue type and reason): Pt stepped in/out of shower anterior/posterior. Cues for technique and encouragement to particpate in transfer Functional mobility during ADLs: Minimal assistance;Rolling walker General ADL Comments: Pt with increased pain and anxiety which limits her ability to participate and complete tasks safely. Pt needing increased assistance with donning/doffing of brace and performing sit to/from stands.      Cognition   Behavior During Therapy: Anxious Overall Cognitive Status: No family/caregiver present to determine baseline cognitive functioning       Memory: Decreased recall of precautions                  Pertinent Vitals/ Pain       Pain Assessment: 0-10 Pain Score: 7  Pain Location: back Pain Descriptors / Indicators: Aching;Discomfort Pain Intervention(s): Limited activity within patient's tolerance;Monitored during session;Repositioned         Frequency Min 2X/week     Progress Toward Goals  OT  Goals(current goals can now be found in the care plan section)  Progress towards OT goals: Progressing toward goals     Plan Discharge plan needs to be updated    End of Session Equipment Utilized During Treatment: Gait belt;Rolling walker;Back brace   Activity Tolerance Other (comment);Patient limited by pain;Patient limited by lethargy (and anxiety)   Patient Left in chair;with call bell/phone within reach;with chair alarm set  Nurse Communication Mobility status;Other (comment) (SNF recommendation)     Time: 9030-0923 OT Time Calculation (min): 21 min  Charges: OT General Charges $OT Visit: 1 Procedure OT Treatments $Self Care/Home Management : 8-22 mins  Jarry Manon , MS, OTR/L, CLT  10/07/2015, 2:08 PM

## 2015-10-08 LAB — GLUCOSE, CAPILLARY: Glucose-Capillary: 179 mg/dL — ABNORMAL HIGH (ref 65–99)

## 2015-10-08 NOTE — Progress Notes (Signed)
Patient ID: [name] [name], female   DOB: 05-08-1951, 64 y.o.   MRN: [id number] Patient is much better with pain control however is slightly confused  Strength is 5 out of 5 wound clean dry and intact  We'll continue to observe I think the Decadron might've made the confusion worse sugars are hovering around 170 the should start going down out of the Decadron's out of her system. Will consult inpatient rehabilitation secondary to concerns about the patient's ability to be compliant at home and the need for additional rehabilitation.

## 2015-10-08 NOTE — Progress Notes (Signed)
Thank you for consult on [name] [name]. She continues to be limited by anxiety, disorientation and safety concerns. She is willing to go to SNF for "a week" as she has a pet she has to get back to and lacks safety awarness.  She is currently min assist overall and SNF recommended due to safety concerns. She lacks medical justification for CIR and concur with SNF recommendations. Will defer CIR consult for now.

## 2015-10-08 NOTE — Progress Notes (Signed)
Physical Therapy Treatment Patient Details Name: [name] [name] MRN: [id number] DOB: 08/03/1951 Today's Date: 10/08/2015    History of Present Illness 64 y.o. female with herniated nucleus with stenosis L3-4 lumbar spinal stenosis L3-4 severe foraminal stenosis L3-L4. Prior L4-5 surgery 2 years ago.    PT Comments    Progressing slowly due to hallucinations, but easily re-oriented.  Saw spider webs on ceiling, cats tied to pole on roof out of her window and a photo of someone with yellow eyes in hallway.  Son arrived at end of session.  Patient will benefit from SNF level rehab at d/c.  Follow Up Recommendations  SNF     Equipment Recommendations  None recommended by PT    Recommendations for Other Services       Precautions / Restrictions Precautions Precautions: Back Required Braces or Orthoses: Spinal Brace Spinal Brace: Lumbar corset;Applied in sitting position    Mobility  Bed Mobility Overal bed mobility: Needs Assistance           Sit to sidelying: Mod assist General bed mobility comments: no rail and bed flat, assist for legs oon bed  Transfers Overall transfer level: Needs assistance Equipment used: Rolling walker (2 wheeled) Transfers: Sit to/from Stand Sit to Stand: Min guard         General transfer comment: cues for hand placement  Ambulation/Gait Ambulation/Gait assistance: Supervision;Min guard Ambulation Distance (Feet): 220 Feet Assistive device: Rolling walker (2 wheeled) Gait Pattern/deviations: Step-through pattern;Decreased stride length     General Gait Details: shaky throughout, maneuvers walker independent   Stairs            Wheelchair Mobility    Modified Rankin (Stroke Patients Only)       Balance Overall balance assessment: Needs assistance Sitting-balance support: Feet supported;Single extremity supported Sitting balance-Leahy Scale: Poor Sitting balance - Comments: needs UE support in sitting at edge of  bed     Standing balance-Leahy Scale: Poor Standing balance comment: UE support for balance, but able to wash hands with less UE support                    Cognition Arousal/Alertness: Awake/alert Behavior During Therapy: WFL for tasks assessed/performed Overall Cognitive Status: Impaired/Different from baseline Area of Impairment: Following commands;Orientation (verbalizing several hallucinations, but easily oriented that they are not real)       Following Commands: Follows multi-step commands with increased time            Exercises      General Comments        Pertinent Vitals/Pain Pain Score: 6  Pain Location: left hip Pain Descriptors / Indicators: Aching Pain Intervention(s): Limited activity within patient's tolerance;Monitored during session;Repositioned    Home Living                      Prior Function            PT Goals (current goals can now be found in the care plan section) Progress towards PT goals: Progressing toward goals    Frequency  Min 5X/week    PT Plan Current plan remains appropriate    Co-evaluation             End of Session Equipment Utilized During Treatment: Back brace Activity Tolerance: Patient tolerated treatment well Patient left: in bed;with call bell/phone within reach     Time: 1345-1417 PT Time Calculation (min) (ACUTE ONLY): 32 min  Charges:  $Gait Training: 23-37  mins                    G Codes:      [name] [name],[name] 10/22/15, 3:55 PM [name] [name], [name] 10/22/15

## 2015-10-09 LAB — BASIC METABOLIC PANEL
ANION GAP: 15 (ref 5–15)
BUN: 58 mg/dL — ABNORMAL HIGH (ref 6–20)
CHLORIDE: 86 mmol/L — AB (ref 101–111)
CO2: 29 mmol/L (ref 22–32)
Calcium: 9.2 mg/dL (ref 8.9–10.3)
Creatinine, Ser: 1.6 mg/dL — ABNORMAL HIGH (ref 0.44–1.00)
GFR calc Af Amer: 38 mL/min — ABNORMAL LOW (ref >60)
GFR, EST NON AFRICAN AMERICAN: 33 mL/min — AB (ref >60)
GLUCOSE: 131 mg/dL — AB (ref 65–99)
POTASSIUM: 3 mmol/L — AB (ref 3.5–5.1)
SODIUM: 130 mmol/L — AB (ref 135–145)

## 2015-10-09 MED ORDER — DIAZEPAM 5 MG PO TABS
5.0000 mg | ORAL_TABLET | Freq: Once | ORAL | Status: AC
  Administered 2015-10-09: 5 mg via ORAL

## 2015-10-09 MED ORDER — DIAZEPAM 1 MG/ML PO SOLN: 5.0000 mg | Freq: Once | ORAL | Status: DC

## 2015-10-09 MED ORDER — DIAZEPAM 5 MG PO TABS
ORAL_TABLET | ORAL | Status: AC
  Filled 2015-10-09: qty 1

## 2015-10-09 NOTE — Progress Notes (Signed)
Patient ID: [name] [name], female   DOB: August 08, 1951, 64 y.o.   MRN: [id number] Patient has episodic confusion last night much better today  Awake alert and appropriate neurologically intact  Strength out of 5 wound clean dry and intact I will plan on checking a BMP check her electrolytes I think her confusion may be hospital related or possibly steroid psychosis I didn't show need inpatient rehabilitation and we are being requested to order a psych consult for patient will qualify for rehabilitation.

## 2015-10-09 NOTE — Progress Notes (Signed)
Pt very confused and disoriented , hallucinating  that a police is in her house  At the back door.  She sees her cat  In her house now and wants to go feed it. Pt reoriented and redirected but remained very confused and very anxious. RN assisted pt to ambulate in hall way and back to bed,  Patient being monitored closely for safety.

## 2015-10-09 NOTE — Progress Notes (Signed)
Physical Therapy Treatment Patient Details Name: JANAISHA TOLSMA MRN: [id number] DOB: 05/13/1951 Today's Date: 10/09/2015    History of Present Illness 64 y.o. female with herniated nucleus with stenosis L3-4 lumbar spinal stenosis L3-4 severe foraminal stenosis L3-L4. Prior L4-5 surgery 2 years ago.    PT Comments    Patient more alert today during session however with decreased safety with use of RW. Attempted 3 steps without RW however patient with decreased balance. Able to recall precautions today. Continue to recommend SNF for ongoing Physical Therapy.     Follow Up Recommendations  SNF     Equipment Recommendations  None recommended by PT    Recommendations for Other Services       Precautions / Restrictions Precautions Precautions: Back Precaution Comments: patient able to recall all precautions today Required Braces or Orthoses: Spinal Brace Spinal Brace: Lumbar corset;Applied in sitting position    Mobility  Bed Mobility     Rolling: Supervision Sidelying to sit: Supervision          Transfers Overall transfer level: Needs assistance Equipment used: Rolling walker (2 wheeled)   Sit to Stand: Min guard         General transfer comment: cues for hand placement  Ambulation/Gait Ambulation/Gait assistance: Min guard Ambulation Distance (Feet): 220 Feet Assistive device: Rolling walker (2 wheeled) Gait Pattern/deviations: Step-through pattern;Decreased stride length     General Gait Details: shaky throughout, maneuvers walker independent however with turns needs cues to keep RW on contact with ground   Stairs            Wheelchair Mobility    Modified Rankin (Stroke Patients Only)       Balance                                    Cognition Arousal/Alertness: Awake/alert Behavior During Therapy: WFL for tasks assessed/performed Overall Cognitive Status: Within Functional Limits for tasks assessed                  General Comments: No hallucinations today noted during PT session    Exercises      General Comments        Pertinent Vitals/Pain Pain Score: 6  Pain Location: left hip Pain Descriptors / Indicators: Aching Pain Intervention(s): Monitored during session    Home Living                      Prior Function            PT Goals (current goals can now be found in the care plan section) Progress towards PT goals: Progressing toward goals    Frequency  Min 5X/week    PT Plan Current plan remains appropriate    Co-evaluation             End of Session Equipment Utilized During Treatment: Back brace Activity Tolerance: Patient tolerated treatment well Patient left: in chair;with call bell/phone within reach;with nursing/sitter in room     Time: 4332-9518 PT Time Calculation (min) (ACUTE ONLY): 17 min  Charges:  $Gait Training: 8-22 mins                    G Codes:      Jacqualyn Posey 10/09/2015, 1:08 PM  10/09/2015 Waverly Chavarria, Tonia Brooms PTA

## 2015-10-09 NOTE — Progress Notes (Signed)
Pt remained confused and hallucinating visually and auditory.  MD on call for DR cram made aware,   valiuim 5 mg x 1 given with less effect. Pt moved to Room [facility] 13  Closer to the nurses station for safety. Will need safety sitter for . Charge RN made aware..will continue to monitor pt closely as pt at risk for injury to self and fall.

## 2015-10-10 LAB — URINALYSIS, ROUTINE W REFLEX MICROSCOPIC
Bilirubin Urine: NEGATIVE
GLUCOSE, UA: NEGATIVE mg/dL
HGB URINE DIPSTICK: NEGATIVE
KETONES UR: NEGATIVE mg/dL
Leukocytes, UA: NEGATIVE
Nitrite: NEGATIVE
PROTEIN: NEGATIVE mg/dL
Specific Gravity, Urine: 1.014 (ref 1.005–1.030)
Urobilinogen, UA: 1 mg/dL (ref 0.0–1.0)
pH: 7 (ref 5.0–8.0)

## 2015-10-10 MED ORDER — SODIUM CHLORIDE 1 G PO TABS
2.0000 g | ORAL_TABLET | Freq: Two times a day (BID) | ORAL | Status: DC
  Administered 2015-10-10 – 2015-10-14 (×10): 2 g via ORAL
  Filled 2015-10-10 (×11): qty 2

## 2015-10-10 MED ORDER — POTASSIUM CHLORIDE CRYS ER 20 MEQ PO TBCR
40.0000 meq | EXTENDED_RELEASE_TABLET | Freq: Two times a day (BID) | ORAL | Status: DC
  Administered 2015-10-10 – 2015-10-14 (×9): 40 meq via ORAL
  Filled 2015-10-10 (×9): qty 2

## 2015-10-10 NOTE — Progress Notes (Signed)
PHARMACIST - PHYSICIAN COMMUNICATION DR:  Saintclair Halsted CONCERNING:  METFORMIN SAFE ADMINISTRATION POLICY  RECOMMENDATION: Metformin has been placed on DISCONTINUE (rejected order) STATUS and should be reordered only after any of the conditions below are ruled out.  Current safety recommendations include avoiding metformin for a minimum of 48 hours after the patient's exposure to intravenous contrast media.  DESCRIPTION:  The Pharmacy Committee has adopted a policy that restricts the use of metformin in hospitalized patients until all the following conditions have been ruled out.  Specific contraindications are:  [x]  eGFR is 33 ml/minute: Renal impairment, eGFR between 30 to 45 mL/min/1.73 m(2): Use not recommended   []  Planned administration of intravenous iodinated contrast media []  Acute or chronic metabolic acidosis (including DKA)  []  Shock, acute MI, sepsis, hypoxemia, dehydration []  Heart Failure patients with low EF        Andruw Battie S. Alford Highland, PharmD, [place] Clinical Staff Pharmacist Pager [phone]  Eilene Ghazi [place], [facility] 10/10/2015 8:26 AM

## 2015-10-10 NOTE — Progress Notes (Signed)
Patient appears more calm, cooperative and less agitated  Today. Patient follows command and able to ask for assistance when OOB. Safety discontinued at this time.Will continue to monitor.  Ave Filter, RN

## 2015-10-10 NOTE — Progress Notes (Signed)
Patient ID: [name] [name], female   DOB: 05/13/1951, 64 y.o.   MRN: [id number] Doing well no confusion overnight I feel this is mostly related to steroids given during her hospitalization she's much more alert and appropriate pain is better controlled  Strength out of 5 wound clean dry and intact  Salt replacement potassium replacement awaiting on rehabilitation

## 2015-10-10 NOTE — Clinical Social Work Note (Signed)
Clinical Social Work Assessment  Patient Details  Name: [name] [name] MRN: [id number] Date of Birth: [birth year]  Date of referral:  10/10/15               Reason for consult:  Facility Placement                Permission sought to share information with:  Family Supports Permission granted to share information::     Name::     [name],[name]  Relationship::  son   Housing/Transportation Living arrangements for the past 2 months:  Single Family Home Source of Information:  Adult Children Patient Interpreter Needed:  None Criminal Activity/Legal Involvement Pertinent to Current Situation/Hospitalization:  No - Comment as needed Significant Relationships:  Adult Children Lives with:  Self Do you feel safe going back to the place where you live?  No Need for family participation in patient care:  Yes (Comment)  Care giving concerns: None    Facilities manager / plan:  CSW spoke with the pt's son [name] [name]. CSW introduced self and purpose of the call. CSW discussed SNF rehab. CSW explained insurance and its relation to SNF placement.  CSW emailed [name] a copy of the SNF list. [name] prefers [facility] [place] or [place]. [name] reported that he will be out of town in the morning, so contact his brother [name] [name]. CSW answered all questions in which the [place] inquired about. CSW will continue to follow this pt and assist with discharge as needed.   Employment status:  Retired Forensic scientist:  [employer] PT Recommendations:  [place] / Referral to community resources:  [place]  Patient/Family's Response to care:  [name] reported that he feels confident that the test will identify what is causing the current medical problem.    Patient/Family's Understanding of and Emotional Response to Diagnosis, Current Treatment, and Prognosis: [name] provided great insight into the pt's diagnosis. [name] acknowledged that it is unsafe for the pt  to return back home in her current condition. [name] agrees with the plan.   Emotional Assessment Appearance:   (Unable to Assess ) Attitude/Demeanor/Rapport:  Unable to Assess Affect (typically observed):  Unable to Assess Orientation:  Oriented to Self, Oriented to Place Alcohol / Substance use:  Not Applicable Psych involvement (Current and /or in the community):  Yes (Comment)  Discharge Needs  Concerns to be addressed:  Denies Needs/Concerns at this time Readmission within the last 30 days:  No Current discharge risk:  None Barriers to Discharge:  No Barriers Identified   Braeley Buskey, LCSW 10/10/2015, 1:20 PM

## 2015-10-10 NOTE — Care Management Important Message (Signed)
Important Message  Patient Details  Name: [name] [name] MRN: [id number] Date of Birth: 1951-03-04   Medicare Important Message Given:  Yes    Jakorian Marengo P Bradley 10/10/2015, 3:21 PM

## 2015-10-10 NOTE — Progress Notes (Signed)
Physical Therapy Treatment Patient Details Name: [name] [name] MRN: [id number] DOB: 1951-07-16 Today's Date: 10/10/2015    History of Present Illness Adm 11/4 for hardware removal from previous back surgery and L3-4 PLA  PMHx-anxiety, chronic pain; back surgery, bil TKR    PT Comments    Patient easily becoming angry when instructions given to improve her safety and adherence to back precautions. No reports of hallucinations. Pt could not be persuaded to walk out into hallway.  Follow Up Recommendations  SNF     Equipment Recommendations  None recommended by PT    Recommendations for Other Services       Precautions / Restrictions Precautions Precautions: Back Precaution Comments: recalled 2 precautions (not bending) Required Braces or Orthoses: Spinal Brace Spinal Brace: Lumbar corset;Applied in sitting position    Mobility  Bed Mobility Overal bed mobility: Needs Assistance Bed Mobility: Rolling;Sidelying to Sit Rolling: Supervision Sidelying to sit: Supervision       General bed mobility comments: +rail, HOB flat; cues to bend knees as HOB lowered to 0  Transfers Overall transfer level: Needs assistance Equipment used: Rolling walker (2 wheeled) Transfers: Sit to/from Stand Sit to Stand: Min guard         General transfer comment: cues for hand placement; x 2 (including impulsively standing from toilet)  Ambulation/Gait Ambulation/Gait assistance: Min guard Ambulation Distance (Feet): 20 Feet (toileted; 20) Assistive device: Rolling walker (2 wheeled) Gait Pattern/deviations: Step-through pattern;Decreased stride length Gait velocity: decreased   General Gait Details: refused to ambulate in hallway due to pain; becoming agitated as trying to persuade her to walk father   Stairs            Wheelchair Mobility    Modified Rankin (Stroke Patients Only)       Balance                                    Cognition  Arousal/Alertness:  (sleeping on arrival; became alert with mobility) Behavior During Therapy: Anxious Overall Cognitive Status: Impaired/Different from baseline Area of Impairment: Memory;Following commands;Safety/judgement;Awareness;Problem solving     Memory: Decreased recall of precautions Following Commands: Follows one step commands with increased time Safety/Judgement: Decreased awareness of safety;Decreased awareness of deficits Awareness: Intellectual Problem Solving: Slow processing;Difficulty sequencing;Requires verbal cues;Requires tactile cues General Comments: slightly agitated/angry when instructions given    Exercises      General Comments        Pertinent Vitals/Pain Pain Assessment: Faces Faces Pain Scale: Hurts even more Pain Location: back Pain Descriptors / Indicators: Operative site guarding;Grimacing Pain Intervention(s): Limited activity within patient's tolerance;Monitored during session;Repositioned    Home Living                      Prior Function            PT Goals (current goals can now be found in the care plan section) Acute Rehab PT Goals Patient Stated Goal: not stated Time For Goal Achievement: 10/19/15 Progress towards PT goals: Progressing toward goals    Frequency  Min 5X/week    PT Plan Current plan remains appropriate    Co-evaluation             End of Session Equipment Utilized During Treatment: Gait belt;Back brace Activity Tolerance: Patient tolerated treatment well Patient left: in chair;with call bell/phone within reach;with nursing/sitter in room     Time: [id number]  PT Time Calculation (min) (ACUTE ONLY): 17 min  Charges:  $Gait Training: 8-22 mins                    G Codes:      Coltan Spinello 10/17/15, 12:51 PM Pager [phone]

## 2015-10-10 NOTE — NC FL2 (Signed)
[place] LEVEL OF CARE SCREENING TOOL     IDENTIFICATION  Patient Name: [name] [name] Birthdate: 1951-05-09 Sex: female Admission Date (Current Location): 10/04/2015  [facility] and [place] Number:     Facility and Address:  [facility] [place] [address], [place], [place] [address]      Provider Number: [id number]  Attending Physician Name and Address:  Kary Kos, MD  Relative Name and Phone Number:       Current Level of Care: Hospital Recommended Level of Care: [place] Prior Approval Number:    Date Approved/Denied:   PASRR Number: [id number]  [facility] [id number])  Discharge Plan: SNF    Current Diagnoses: Patient Active Problem List   Diagnosis Date Noted  . Spinal stenosis of lumbar region 10/04/2015  . Unspecified essential hypertension 05/03/2014  . Minigastric bypass in [facility] 2006 05/03/2014  . Hypercholesteremia 05/03/2014  . Cellulitis and abscess of leg, except foot 05/03/2014  . Diffuse cystic mastopathy 05/03/2014  . COPD 05/03/2014  . Urethral stricture unspecified 05/03/2014  . Hx of decompressive lumbar laminectomy2014 Saintclair Halsted 05/03/2014    Orientation ACTIVITIES/SOCIAL BLADDER RESPIRATION    Self, Place  Family supportive Continent Normal  BEHAVIORAL SYMPTOMS/MOOD NEUROLOGICAL BOWEL NUTRITION STATUS      Continent Diet (Carb Modified )  PHYSICIAN VISITS COMMUNICATION OF NEEDS Height & Weight Skin    Verbally 5' (152.4 cm) 184 lbs. Surgical wounds          AMBULATORY STATUS RESPIRATION     (Minimal assistance) Normal      Personal Care Assistance Level of Assistance  Bathing, Dressing Bathing Assistance:  (Minimal assistance)   Dressing Assistance:  (Minimal assistance)      Functional Limitations [place]  PT (By licensed PT), OT (By licensed OT)                   Additional Factors Info  Code Status, Allergies Code  Status Info: Full  Allergies Info: Bee Venom, Erythrocin, Fioricet, Other, Penicillins, Clindamycin/lincomycin, Hydrocodone-acetaminophen, Morphine And Related, Phenobarbital, Teflaro, Vancomycin           Current Medications (10/10/2015): Current Facility-Administered Medications  Medication Dose Route Frequency Provider Last Rate Last Dose  . 0.9 %  sodium chloride infusion  250 mL Intravenous Continuous Kary Kos, MD   250 mL at 10/04/15 1330  . 0.9 % NaCl with KCl 20 mEq/ L  infusion   Intravenous Continuous Kary Kos, MD 75 mL/hr at 10/10/15 1021    . acetaminophen (TYLENOL) tablet 650 mg  650 mg Oral Q4H PRN Kary Kos, MD   650 mg at 10/10/15 [id number]   Or  . acetaminophen (TYLENOL) suppository 650 mg  650 mg Rectal Q4H PRN Kary Kos, MD      . allopurinol (ZYLOPRIM) tablet 300 mg  300 mg Oral Daily Kary Kos, MD   300 mg at 10/10/15 0950  . bumetanide (BUMEX) tablet 1 mg  1 mg Oral BID Kary Kos, MD   1 mg at 10/10/15 0830  . calcium carbonate (TUMS - dosed in mg elemental calcium) chewable tablet 200 mg of elemental calcium  1 tablet Oral Daily PRN Kary Kos, MD      . colchicine tablet 0.6 mg  0.6 mg Oral Daily Kary Kos, MD   0.6 mg at 10/10/15 1005  . cyclobenzaprine (FLEXERIL) tablet 10 mg  10 mg Oral TID PRN Kary Kos, MD   10 mg at 10/09/15 1748  . docusate sodium (COLACE) capsule 100 mg  100 mg Oral BID Kary Kos, MD   100 mg at 10/10/15 0951  . doxepin (SINEQUAN) capsule 25 mg  25 mg Oral BID Kary Kos, MD   25 mg at 10/10/15 0953  . DULoxetine (CYMBALTA) DR capsule 60 mg  60 mg Oral BID Kary Kos, MD   60 mg at 10/10/15 0951  . erythromycin ophthalmic ointment 1 application  1 application Both Eyes QHS Kary Kos, MD   1 application at 0000000 2214  . HYDROmorphone (DILAUDID) injection 0.5-1 mg  0.5-1 mg Intravenous Q2H PRN Kary Kos, MD   1 mg at 10/08/15 2205  . loperamide (IMODIUM) capsule 2 mg  2 mg Oral Daily PRN Kary Kos, MD      . magnesium oxide (MAG-OX) tablet 400 mg   400 mg Oral Daily Kary Kos, MD   400 mg at 10/10/15 [id number]  . menthol-cetylpyridinium (CEPACOL) lozenge 3 mg  1 lozenge Oral PRN Kary Kos, MD       Or  . phenol [facility]) mouth spray 1 spray  1 spray Mouth/Throat PRN Kary Kos, MD      . metolazone (ZAROXOLYN) tablet 2.5 mg  2.5 mg Oral Daily Kary Kos, MD   2.5 mg at 10/10/15 0950  . metoprolol succinate (TOPROL-XL) 24 hr tablet 50 mg  50 mg Oral BID Kary Kos, MD   50 mg at 10/10/15 [id number]  . multivitamin with minerals tablet 1 tablet  1 tablet Oral BID Kary Kos, MD   1 tablet at 10/10/15 [phone]  . ofloxacin (OCUFLOX) 0.3 % ophthalmic solution 1 drop  1 drop Both Eyes QID Kary Kos, MD   1 drop at 10/10/15 0953  . ondansetron (ZOFRAN) injection 4 mg  4 mg Intravenous Q4H PRN Kary Kos, MD      . ondansetron [facility]) tablet 8 mg  8 mg Oral Q8H PRN Kary Kos, MD      . pantoprazole (PROTONIX) EC tablet 40 mg  40 mg Oral Daily Kary Kos, MD   40 mg at 10/10/15 0956  . pantoprazole (PROTONIX) EC tablet 80 mg  80 mg Oral BID Kary Kos, MD   80 mg at 10/10/15 0951  . polyethylene glycol (MIRALAX / GLYCOLAX) packet 17 g  17 g Oral Daily Kary Kos, MD   17 g at 10/10/15 0952  . polyvinyl alcohol (LIQUIFILM TEARS) 1.4 % ophthalmic solution 1 drop  1 drop Both Eyes PRN Kary Kos, MD      . potassium chloride SA (K-DUR,KLOR-CON) CR tablet 40 mEq  40 mEq Oral BID Kary Kos, MD   40 mEq at 10/10/15 0952  . senna-docusate (Senokot-S) tablet 1 tablet  1 tablet Oral QHS PRN Kary Kos, MD   1 tablet at 10/08/15 2205  . sodium chloride 0.9 % injection 3 mL  3 mL Intravenous Q12H Kary Kos, MD   3 mL at 10/10/15 1002  . sodium chloride 0.9 % injection 3 mL  3 mL Intravenous PRN Kary Kos, MD   3 mL at 10/10/15 1002  . sodium chloride tablet 2 g  2 g Oral BID WC Kary Kos, MD   2 g at 10/10/15 0900  . spironolactone (ALDACTONE) tablet 50 mg  50 mg Oral BID Kary Kos, MD   50 mg at 10/10/15 0753  . traMADol (ULTRAM) tablet 50 mg  50 mg Oral Q6H  PRN Kevan Ny  Ditty, MD   50 mg at 10/09/15 2213  . zolpidem (AMBIEN) tablet 5 mg  5 mg Oral QHS Kary Kos, MD   5 mg at 10/09/15 2213   Do not use this list as official medication orders. Please verify with discharge summary.  Discharge Medications:   Medication List    ASK your doctor about these medications        allopurinol 300 MG tablet  Commonly known as:  ZYLOPRIM  Take 300 mg by mouth daily.     bumetanide 1 MG tablet  Commonly known as:  BUMEX  Take 1 mg by mouth 2 (two) times daily.     calcium carbonate 500 MG chewable tablet  Commonly known as:  TUMS - dosed in mg elemental calcium  Chew 1 tablet by mouth daily as needed for indigestion or heartburn.     colchicine 0.6 MG tablet  Take 0.6 mg by mouth daily.     doxepin 25 MG capsule  Commonly known as:  SINEQUAN  Take 25 mg by mouth 2 (two) times daily.     DULoxetine 60 MG capsule  Commonly known as:  CYMBALTA  Take 60 mg by mouth 2 (two) times daily.     erythromycin ophthalmic ointment  Place 1 application into both eyes at bedtime.     esomeprazole 20 MG capsule  Commonly known as:  NEXIUM  Take 20 mg by mouth 2 (two) times daily before a meal.     loperamide 2 MG capsule  Commonly known as:  IMODIUM  Take 2 mg by mouth daily as needed for diarrhea or loose stools.     magnesium oxide 400 MG tablet  Commonly known as:  MAG-OX  Take 400 mg by mouth daily.     metFORMIN 500 MG tablet  Commonly known as:  GLUCOPHAGE  Take 500 mg by mouth daily with breakfast.     metolazone 2.5 MG tablet  Commonly known as:  ZAROXOLYN  Take 2.5 mg by mouth daily.     metoprolol succinate 50 MG 24 hr tablet  Commonly known as:  TOPROL-XL  Take 50 mg by mouth 2 (two) times daily. Take with or immediately following a meal.     metroNIDAZOLE 1 % gel  Commonly known as:  METROGEL  Apply 1 application topically 2 (two) times daily.     multivitamin with minerals Tabs tablet  Take 1 tablet by mouth 2 (two) times daily.      ofloxacin 0.3 % ophthalmic solution  Commonly known as:  OCUFLOX  Place 1 drop into both eyes 4 (four) times daily.     ondansetron 8 MG tablet  Commonly known as:  ZOFRAN  Take 8 mg by mouth every 8 (eight) hours as needed for nausea or vomiting.     OPTIVE 0.5-0.9 % Soln  Generic drug:  Carboxymethylcellul-Glycerin  Place 1 drop into both eyes daily as needed (dry eyes).     oxyCODONE-acetaminophen 5-325 MG tablet  Commonly known as:  PERCOCET/ROXICET  Take 1 tablet by mouth every 6 (six) hours as needed for pain.     polyethylene glycol packet  Commonly known as:  MIRALAX / GLYCOLAX  Take 17 g by mouth daily.     potassium chloride 10 MEQ CR capsule  Commonly known as:  MICRO-K  Take 10 mEq by mouth 2 (two) times daily.     spironolactone 50 MG tablet  Commonly known as:  ALDACTONE  Take  50 mg by mouth 2 (two) times daily.     sulfamethoxazole-trimethoprim 800-160 MG tablet  Commonly known as:  BACTRIM DS,SEPTRA DS  Take 1 tablet by mouth 2 (two) times daily.  Ask about: Should I take this medication?     triamcinolone cream 0.1 %  Commonly known as:  KENALOG  Apply 1 application topically 2 (two) times daily as needed (affected area).     zolpidem 10 MG tablet  Commonly known as:  AMBIEN  Take 5 mg by mouth at bedtime.        Relevant Imaging Results:  Relevant Lab Results:  Recent Labs    Additional Information    Shaurya Rawdon, LCSW

## 2015-10-11 DIAGNOSIS — F4323 Adjustment disorder with mixed anxiety and depressed mood: Secondary | ICD-10-CM | POA: Clinically undetermined

## 2015-10-11 DIAGNOSIS — F060 Psychotic disorder with hallucinations due to known physiological condition: Secondary | ICD-10-CM

## 2015-10-11 NOTE — Clinical Social Work Note (Signed)
CSW spoke with the pt's son Venancio Poisson regarding the family SNF preferences. CSW informed Huck that [facility] [place] did not have any female bed. Huck requested the CSW to reach out to [facility]. CSW spoke with Abigail Butts from [facility] reported that she could offer a bed, but due to the open insurance claim the pt has since 11/30/2013 that she could not accept the pt. CSW informed [facility]. CSW left Lorenda Ishihara (he is out of town and will not return until Sunday) a voice mail. Huck reported that he will communicate this with Mendel Ryder his sister (who is stationed in [place]). CSW received a call from  [place], who identified herself as the pt's daughter, seeking clarification regarding the open claim. CSW explained to Mendel Ryder that the family needs to contact the insurance company to gather more detail regarding the open claim. CSW received another call from [place] giving the [place] and medical staff permission to talk to Jeannette How [phone].  CSW explained to [place] and Mendel Ryder the discharge options. Whit reported that he needed to talk to his brother Venancio Poisson regarding private pay. Whit expressed again that home is not an option because he works and [employer] just had shoulder surgery. CSW awaiting a call back.    Clinical Social Worker  Caedmon Louque, MSW, LCSW [phone]

## 2015-10-11 NOTE — Consult Note (Signed)
[facility] Consult   Reason for Consult:  Transitional psychosis, depression and disposition plan Referring Physician:  Dr. Saintclair Halsted Patient Identification: [name] [name] MRN:  [id number] Principal Diagnosis: Adjustment disorder with mixed anxiety and depressed mood Diagnosis:   Patient Active Problem List   Diagnosis Date Noted  . Adjustment disorder with mixed anxiety and depressed mood [F43.23] 10/11/2015  . Spinal stenosis of lumbar region [M48.06] 10/04/2015  . Unspecified essential hypertension [I10] 05/03/2014  . Minigastric bypass in [facility] 2006 [Z98.84] 05/03/2014  . Hypercholesteremia [E78.00] 05/03/2014  . Cellulitis and abscess of leg, except foot [L02.419, L03.119] 05/03/2014  . Diffuse cystic mastopathy [N60.19] 05/03/2014  . COPD [J44.9] 05/03/2014  . Urethral stricture unspecified [N35.9] 05/03/2014  . Hx of decompressive lumbar laminectomy2014 Saintclair Halsted [J85.631] 05/03/2014    Total Time spent with patient: 1 hour  Subjective:   [name] [name] is a 64 y.o. female patient admitted with progress worsening back and bilateral leg pain and spinal stenosis.  HPI:  [name] [name] is a 64 years old female, living by herself in [facility] and has a 3 hours health at home daily. Patient was admitted to the [facility] with progressive worsening of back and leg pains and required surgical correction of spinal stenosis. Psychiatric consultation requested for psychosis and depression. Patient has been depressed since she has to sold her house where she stayed 36 years and relocated to [facility] about 4-6 months ago. Patient has been treated with antidepressant medications Cymbalta 60 mg daily for depression and also helping with her chronic back pain. Patient has visual hallucinations like seeing spiderweb's and also deceased dog while in the hospital during her first 2 days. Patient denied current auditory/visual hallucinations, delusions and paranoia.  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 Patient has grownup children, 2 sons and one daughter. Patient's son has her medical care power of attorney. Patient denies current suicidal/homicidal ideation, intentions or plans. Patient was never received acute psychiatric hospitalization or outpatient medication management. Patient stated she had visual hallucinations last time when she had a urinary tract infection but does not required any psychiatric medication management.  Medical history: Patient is a very pleasant 64 year old female is a progress worsening back and bilateral leg pain worse on the right the last several months. Workup has revealed an acute disc herniation at L3-4 above the level of a previous L4-5 fusion. With severe spinal stenosis and evidence of instability. Due to patient's failed conservative treatment imaging findings and progression of clinical syndrome I recommended decompression and fusion at L3-4 with exploration of fusion removal of hardware L4-5 I extensively went over the risks and benefits of the procedure with the patient as well as perioperative course expectations of outcome and alternatives of surgery and she understands and agrees to proceed forward.  Past Psychiatric History: Patient was seen at [facility] counselor about a year ago with the advice of her daughter who is a Engineer, water, works in [employer] in [place]. Patient did not like Marketing executive but willing to seek counseling from another psychologist near [facility].  Risk to Self: Is patient at risk for suicide?: No Risk to Others:   Prior Inpatient Therapy:   Prior Outpatient Therapy:    Past Medical History:  Past Medical History  Diagnosis Date  . Hypertension   . Depression   . Anxiety   . H/O hiatal hernia   . Arthritis   . Cellulitis and abscess of leg  last yr  . SVT (supraventricular tachycardia) ([place])     on pt's medical record from PCP - pt  doesn't remember  . Sleep apnea     does not use Cpap - couldn't keep mask on.  . Hyperlipidemia   . Abnormal kidney function     with extremity swelling  . Seizures ([place])      x 2 with high fever as an adult  . Neuropathy ([place])   . Cancer ([place])     skin cancer  . Low iron     after a miscarriage  . Pancreatitis   . GERD (gastroesophageal reflux disease)   . Headache     Tension headaches  . Chronic pain     Past Surgical History  Procedure Laterality Date  . Cesarean section      x 3  . Hernia repair    . Abdominal hysterectomy    . Gastric bypass      minigastric bypass 2006 in [facility]  . Joint replacement      bil knee  . Tonsillectomy    . Back surgery      02/06/2013  . Breath tek h pylori N/A 06/18/2014    Procedure: BREATH TEK H PYLORI;  Surgeon: Pedro Earls, MD;  Location: [facility];  Service: General;  Laterality: N/A;  . Eye surgery Bilateral     cataract surgery  . Laparoscopic bilateral salpingo oopherectomy     Family History:  Family History  Problem Relation Age of Onset  . COPD Mother   . Cancer Mother   . Hypertension Sister   . Hyperlipidemia Sister   . Obesity Sister   . Heart failure Father    Family Psychiatric  History: Patient has no family history of mental illness Social History:  History  Alcohol Use  . Yes    Comment: occ     History  Drug Use No    Social History   Social History  . Marital Status: Divorced    Spouse Name: N/A  . Number of Children: N/A  . Years of Education: N/A   Social History Main Topics  . Smoking status: Never Smoker   . Smokeless tobacco: Never Used  . Alcohol Use: Yes     Comment: occ  . Drug Use: No  . Sexual Activity: Not Asked   Other Topics Concern  . 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                          Allergies:   Allergies  Allergen  Reactions  . Bee Venom Anaphylaxis  . Erythrocin [Erythromycin]     Unknown reaction  . Fioricet [Butalbital-Apap-Caffeine] Other (See Comments)    dizziness  . Other     Barbiturates-reaction unknown.  Marland Kitchen Penicillins Other (See Comments)    Reaction unknown  . Clindamycin/Lincomycin Rash  . Hydrocodone-Acetaminophen Rash  . Morphine And Related Rash  . Phenobarbital Rash  . Teflaro [Ceftaroline] Rash  . Vancomycin Rash    Labs:  Results for orders placed or performed during the hospital encounter of 10/04/15 (from the past 48 hour(s))  Basic metabolic panel     Status: Abnormal   Collection Time: 10/09/15  1:25 PM  Result Value Ref Range   Sodium 130 (L) 135 - 145 mmol/L   Potassium 3.0 (  L) 3.5 - 5.1 mmol/L   Chloride 86 (L) 101 - 111 mmol/L   CO2 29 22 - 32 mmol/L   Glucose, Bld 131 (H) 65 - 99 mg/dL   BUN 58 (H) 6 - 20 mg/dL   Creatinine, Ser 1.60 (H) 0.44 - 1.00 mg/dL   Calcium 9.2 8.9 - 10.3 mg/dL   GFR calc non Af Amer 33 (L) >60 mL/min   GFR calc Af Amer 38 (L) >60 mL/min    Comment: (NOTE) The eGFR has been calculated using the CKD EPI equation. This calculation has not been validated in all clinical situations. eGFR's persistently <60 mL/min signify possible Chronic Kidney Disease.    Anion gap 15 5 - 15  Urinalysis, Routine w reflex microscopic (not at [facility])     Status: None   Collection Time: 10/10/15  9:45 AM  Result Value Ref Range   Color, Urine YELLOW YELLOW   APPearance CLEAR CLEAR   Specific Gravity, Urine 1.014 1.005 - 1.030   pH 7.0 5.0 - 8.0   Glucose, UA NEGATIVE NEGATIVE mg/dL   Hgb urine dipstick NEGATIVE NEGATIVE   Bilirubin Urine NEGATIVE NEGATIVE   Ketones, ur NEGATIVE NEGATIVE mg/dL   Protein, ur NEGATIVE NEGATIVE mg/dL   Urobilinogen, UA 1.0 0.0 - 1.0 mg/dL   Nitrite NEGATIVE NEGATIVE   Leukocytes, UA NEGATIVE NEGATIVE    Comment: MICROSCOPIC NOT DONE ON URINES WITH NEGATIVE PROTEIN, BLOOD, LEUKOCYTES, NITRITE, OR GLUCOSE <1000 mg/dL.     Current Facility-Administered Medications  Medication Dose Route Frequency Provider Last Rate Last Dose  . 0.9 %  sodium chloride infusion  250 mL Intravenous Continuous Kary Kos, MD   250 mL at 10/04/15 1330  . 0.9 % NaCl with KCl 20 mEq/ L  infusion   Intravenous Continuous Kary Kos, MD 75 mL/hr at 10/10/15 1021    . acetaminophen (TYLENOL) tablet 650 mg  650 mg Oral Q4H PRN Kary Kos, MD   650 mg at 10/10/15 1712   Or  . acetaminophen (TYLENOL) suppository 650 mg  650 mg Rectal Q4H PRN Kary Kos, MD      . allopurinol (ZYLOPRIM) tablet 300 mg  300 mg Oral Daily Kary Kos, MD   300 mg at 10/11/15 1006  . bumetanide (BUMEX) tablet 1 mg  1 mg Oral BID Kary Kos, MD   1 mg at 10/11/15 1008  . calcium carbonate (TUMS - dosed in mg elemental calcium) chewable tablet 200 mg of elemental calcium  1 tablet Oral Daily PRN Kary Kos, MD   200 mg of elemental calcium at 10/11/15 1008  . colchicine tablet 0.6 mg  0.6 mg Oral Daily Kary Kos, MD   0.6 mg at 10/11/15 1008  . cyclobenzaprine (FLEXERIL) tablet 10 mg  10 mg Oral TID PRN Kary Kos, MD   10 mg at 10/10/15 1712  . docusate sodium (COLACE) capsule 100 mg  100 mg Oral BID Kary Kos, MD   100 mg at 10/11/15 1009  . doxepin (SINEQUAN) capsule 25 mg  25 mg Oral BID Kary Kos, MD   25 mg at 10/11/15 1009  . DULoxetine (CYMBALTA) DR capsule 60 mg  60 mg Oral BID Kary Kos, MD   60 mg at 10/11/15 1008  . erythromycin ophthalmic ointment 1 application  1 application Both Eyes QHS Kary Kos, MD   1 application at 98/11/91 2149  . HYDROmorphone (DILAUDID) injection 0.5-1 mg  0.5-1 mg Intravenous Q2H PRN Kary Kos, MD   0.5 mg at  10/11/15 1015  . loperamide (IMODIUM) capsule 2 mg  2 mg Oral Daily PRN Kary Kos, MD      . magnesium oxide (MAG-OX) tablet 400 mg  400 mg Oral Daily Kary Kos, MD   400 mg at 10/11/15 1009  . menthol-cetylpyridinium (CEPACOL) lozenge 3 mg  1 lozenge Oral PRN Kary Kos, MD       Or  . phenol [facility]) mouth spray 1  spray  1 spray Mouth/Throat PRN Kary Kos, MD      . metolazone (ZAROXOLYN) tablet 2.5 mg  2.5 mg Oral Daily Kary Kos, MD   2.5 mg at 10/11/15 1008  . metoprolol succinate (TOPROL-XL) 24 hr tablet 50 mg  50 mg Oral BID Kary Kos, MD   50 mg at 10/11/15 1008  . multivitamin with minerals tablet 1 tablet  1 tablet Oral BID Kary Kos, MD   1 tablet at 10/11/15 1008  . ofloxacin (OCUFLOX) 0.3 % ophthalmic solution 1 drop  1 drop Both Eyes QID Kary Kos, MD   1 drop at 10/11/15 1010  . ondansetron (ZOFRAN) injection 4 mg  4 mg Intravenous Q4H PRN Kary Kos, MD   4 mg at 10/10/15 1805  . ondansetron (ZOFRAN) tablet 8 mg  8 mg Oral Q8H PRN Kary Kos, MD      . pantoprazole (PROTONIX) EC tablet 40 mg  40 mg Oral Daily Kary Kos, MD   40 mg at 10/10/15 0956  . pantoprazole (PROTONIX) EC tablet 80 mg  80 mg Oral BID Kary Kos, MD   80 mg at 10/11/15 1009  . polyethylene glycol (MIRALAX / GLYCOLAX) packet 17 g  17 g Oral Daily Kary Kos, MD   17 g at 10/11/15 1009  . polyvinyl alcohol (LIQUIFILM TEARS) 1.4 % ophthalmic solution 1 drop  1 drop Both Eyes PRN Kary Kos, MD      . potassium chloride SA (K-DUR,KLOR-CON) CR tablet 40 mEq  40 mEq Oral BID Kary Kos, MD   40 mEq at 10/11/15 1009  . senna-docusate (Senokot-S) tablet 1 tablet  1 tablet Oral QHS PRN Kary Kos, MD   1 tablet at 10/08/15 2205  . sodium chloride 0.9 % injection 3 mL  3 mL Intravenous Q12H Kary Kos, MD   3 mL at 10/10/15 2145  . sodium chloride 0.9 % injection 3 mL  3 mL Intravenous PRN Kary Kos, MD   3 mL at 10/10/15 1002  . sodium chloride tablet 2 g  2 g Oral BID WC Kary Kos, MD   2 g at 10/11/15 0826  . spironolactone (ALDACTONE) tablet 50 mg  50 mg Oral BID Kary Kos, MD   50 mg at 10/11/15 0826  . traMADol (ULTRAM) tablet 50 mg  50 mg Oral Q6H PRN Kevan Ny Ditty, MD   50 mg at 10/11/15 0459  . zolpidem (AMBIEN) tablet 5 mg  5 mg Oral QHS Kary Kos, MD   5 mg at 10/10/15 2156    Musculoskeletal: Strength & Muscle Tone:  decreased Gait & Station: unable to stand Patient leans: N/A  Psychiatric Specialty Exam: ROS low back pain, depression No Fever-chills, No Headache, No changes with Vision or hearing, reports vertigo No problems swallowing food or Liquids, No Chest pain, Cough or Shortness of Breath, No Abdominal pain, No Nausea or Vommitting, Bowel movements are regular, No Blood in stool or Urine, No dysuria, No new skin rashes or bruises, Patient has low back pain and status post knee replacements  No new weakness, tingling, numbness in any extremity, No recent weight gain or loss, No polyuria, polydypsia or polyphagia,   A full 10 point Review of Systems was done, except as stated above, all other Review of Systems were negative.  Blood pressure 122/55, pulse 70, temperature 97.6 F (36.4 C), temperature source Oral, resp. rate 20, height 5' (1.524 m), weight 83.462 kg (184 lb), SpO2 100 %.Body mass index is 35.94 kg/(m^2).  General Appearance: Casual  Eye Contact::  Good  Speech:  Clear and Coherent  Volume:  Normal  Mood:  Anxious and Depressed  Affect:  Constricted and Depressed  Thought Process:  Coherent and Goal Directed  Orientation:  Full (Time, Place, and Person)  Thought Content:  WDL  Suicidal Thoughts:  No  Homicidal Thoughts:  No  Memory:  Immediate;   Good Recent;   Good Remote;   Good  Judgement:  Intact  Insight:  Good  Psychomotor Activity:  Decreased  Concentration:  Good  Recall:  Good  Fund of Knowledge:Good  Language: Good  Akathisia:  Negative  Handed:  Right  AIMS (if indicated):     Assets:  Communication Skills Desire for Improvement Financial Resources/Insurance Housing Leisure Time Resilience Social Support Talents/Skills Transportation  ADL's:  Impaired  Cognition: WNL  Sleep:      Treatment Plan Summary: Daily contact with patient to assess and evaluate symptoms and progress in treatment and Plan Patient meets criteria for capacity to  make her  own medical decisions and living arrangements. Patient has no acute visual/auditory hallucinations at this time. Patient visual hallucinations has been dissolved with her current treatment.   Patient has no safety concerns and contract for safety Patient reportedly made up her mind to go to skilled nursing facility then discharged from the hospital and her son is supporting her decision at this time. Recommended no acute psychiatric medication management at this time. Recommended to continue her Cymbalta 60 mg daily which helps for depression and also for chronic pain. Appreciate psychiatric consultation and we sign off at this time Please contact [phone] or [phone] if needs further assistance   Disposition: Patient does not meet criteria for psychiatric inpatient admission. Supportive therapy provided about ongoing stressors. Patient will benefit from the skilled nursing facility with physical rehabilitation when medically stable and discharged from the hospital  [place]. 10/11/2015 11:17 AM

## 2015-10-11 NOTE — Progress Notes (Signed)
Patient ID: [name] [name], female   DOB: June 14, 1951, 64 y.o.   MRN: [id number] Patient doing well no back pain some radiculitis left leg Strength out of 5 wound clean dry and intact Continue to mobilize in physical outpatient therapy  Psychiatric consult pending for placement

## 2015-10-12 LAB — BASIC METABOLIC PANEL
ANION GAP: 10 (ref 5–15)
BUN: 30 mg/dL — ABNORMAL HIGH (ref 6–20)
CALCIUM: 9.5 mg/dL (ref 8.9–10.3)
CO2: 29 mmol/L (ref 22–32)
Chloride: 96 mmol/L — ABNORMAL LOW (ref 101–111)
Creatinine, Ser: 1.26 mg/dL — ABNORMAL HIGH (ref 0.44–1.00)
GFR, EST AFRICAN AMERICAN: 50 mL/min — AB (ref >60)
GFR, EST NON AFRICAN AMERICAN: 43 mL/min — AB (ref >60)
Glucose, Bld: 138 mg/dL — ABNORMAL HIGH (ref 65–99)
Potassium: 3.7 mmol/L (ref 3.5–5.1)
Sodium: 135 mmol/L (ref 135–145)

## 2015-10-12 NOTE — Progress Notes (Signed)
Patient ID: [name] [name], female   DOB: Nov 01, 1951, 64 y.o.   MRN: [id number] Seems to be doing okay. No change in exam. Up to the bathroom and walking well.

## 2015-10-12 NOTE — Social Work (Signed)
Patient's son Kerry-Ann Flager returned call. Mr. Winterfeld stated that they have tried to contact Medicare in order to resolve the issue, but will not be able to contact them until Monday 10/14/15 because they are closed over the weekend.   CSW will provide bed offers as they present themselves.   Christene Lye MSW, [place]

## 2015-10-12 NOTE — Social Work (Signed)
CSW met with patient to discuss updates with discharge plans. Patient stated that she wanted CSW to speak to her children about her discharge plan. Patient did make it very clear that she could make her own decisions but wanted to defer the decision specifically to her son Marija Calamari. However, patient also wanted to consistently be made aware of what is happening in the process. Patient also stated that [facility] is out of town this weekend with no reception, so she advised this CSW to contact [facility].   This CSW called and left a message with Jerene Pitch for call back.  Christene Lye MSW, [place]

## 2015-10-13 NOTE — Progress Notes (Signed)
Patient ID: [name] [name], female   DOB: 1950-12-13, 64 y.o.   MRN: [id number] BP 103/59 mmHg  Pulse 92  Temp(Src) 98.5 F (36.9 C) (Oral)  Resp 20  Ht 5' (1.524 m)  Wt 83.462 kg (184 lb)  BMI 35.94 kg/m2  SpO2 100% Alert and oriented x 4 Moving lower extremities well Dressings in place Feels washed out Continue with pt

## 2015-10-14 MED ORDER — TRAMADOL HCL 50 MG PO TABS: 50.0000 mg | ORAL_TABLET | Freq: Four times a day (QID) | ORAL | Status: DC | PRN

## 2015-10-14 NOTE — Progress Notes (Signed)
Subjective: Patient reports Do much better this morning pain well-controlled  Objective: Vital signs in last 24 hours: Temp:  [98 F (36.7 C)-98.5 F (36.9 C)] 98.2 F (36.8 C) (11/14 0519) Pulse Rate:  [69-92] 70 (11/14 0519) Resp:  [18-20] 18 (11/14 0519) BP: (103-129)/(49-76) 112/49 mmHg (11/14 0519) SpO2:  [99 %-100 %] 100 % (11/14 0519)  Intake/Output from previous day: 11/13 0701 - 11/14 0700 In: 1080 [P.O.:1080] Out: -  Intake/Output this shift:    Strength out of 5 wound clean dry and intact  Lab Results: No results for input(s): WBC, HGB, HCT, PLT in the last 72 hours. BMET  Recent Labs  10/12/15 0522  NA 135  K 3.7  CL 96*  CO2 29  GLUCOSE 138*  BUN 30*  CREATININE 1.26*  CALCIUM 9.5    Studies/Results: No results found.  Assessment/Plan: Patient progressing along well has been cleared by psychiatry patient is stable to be transferred to the skilled nursing facility when bed is available  LOS: 10 days     Zavon Hyson P 10/14/2015, 7:44 AM

## 2015-10-14 NOTE — Care Management Important Message (Signed)
Important Message  Patient Details  Name: [name] [name] MRN: [id number] Date of Birth: 1951/01/03   Medicare Important Message Given:  Yes    Dearion Huot P Deaven Urwin 10/14/2015, 2:54 PM

## 2015-10-14 NOTE — Progress Notes (Signed)
[facility] called to pick up pt

## 2015-10-14 NOTE — Progress Notes (Signed)
[facility] here to pick up pt , pt is happy to go to [facility] living

## 2015-10-14 NOTE — Clinical Social Work Placement (Signed)
   CLINICAL SOCIAL WORK PLACEMENT  NOTE  Date:  10/14/2015  Patient Details  Name: [name] [name] MRN: [id number] Date of Birth: 30-Apr-1951  Clinical Social Work is seeking post-discharge placement for this patient at the [place] level of care (*CSW will initial, date and re-position this form in  chart as items are completed):  Yes   Patient/family provided with [facility] Work Department's list of facilities offering this level of care within the geographic area requested by the patient (or if unable, by the patient's family).  Yes   Patient/family informed of their freedom to choose among providers that offer the needed level of care, that participate in Medicare, Medicaid or managed care program needed by the patient, have an available bed and are willing to accept the patient.  Yes   Patient/family informed of [facility]'s ownership interest in [facility] and [facility], as well as of the fact that they are under no obligation to receive care at these facilities.  PASRR submitted to [employer] on 10/10/15 ([id number])     PASRR number received on 10/10/15     Existing PASRR number confirmed on       FL2 transmitted to all facilities in geographic area requested by pt/family on       FL2 transmitted to all facilities within larger geographic area on       Patient informed that his/her managed care company has contracts with or will negotiate with certain facilities, including the following:        Yes   Patient/family informed of bed offers received.  Patient chooses bed at [facility]     Physician recommends and patient chooses bed at      Patient to be transferred to [facility] on 10/14/15.  Patient to be transferred to facility by [facility]      Patient family notified on 10/14/15 of transfer.  Name of family member notified:  Descoteaux,Whit, son      PHYSICIAN Please sign FL2     Additional Comment:     _______________________________________________ [name], LCSW 10/14/2015, 2:52 PM

## 2015-10-14 NOTE — Discharge Summary (Signed)
Physician Discharge Summary  Patient ID: [name] [name] MRN: [id number] DOB/AGE: 1951-10-12 64 y.o.  Admit date: 10/04/2015 Discharge date: 10/14/2015  Admission Diagnoses: Lumbar spinal stenosis lumbar instability L3-4  Discharge Diagnoses: Same Principal Problem:   Adjustment disorder with mixed anxiety and depressed mood Active Problems:   Spinal stenosis of lumbar region   Discharged Condition: good  Hospital Course: Patient is admitted hospital underwent decompressive laminectomy and interbody fusion L3-4 with an exploration of fusion we'll hardware L4-5. Postoperatively patient did very well recovered in the floor she had a lot of postoperative pain and was very slow to mobilize we treated some as postoperative pain with IV steroids patient developed an acute confusional state with some psychoses that I felt was attributable to the IV steroids however we did have the patient seen by psychiatry who cleared the patient after she was off the steroids for 24-48 hours mental status is admit significantly improved her pain significantly improved she was observed her neck several days electrolytes were checked they were remarkable only for mildly being hyponatremic and hypokalemic which were replaced. At the time of discharge patient is angling and voiding spontaneously tolerating regular diet and stable for discharge to the nursing facility. Patient be scheduled follow-up 1-2 weeks.  Consults: Significant Diagnostic Studies: Treatments: L3-4 fusion Discharge Exam: Blood pressure 104/46, pulse 80, temperature 98.4 F (36.9 C), temperature source Oral, resp. rate 18, height 5' (1.524 m), weight 83.462 kg (184 lb), SpO2 99 %. Strength out of 5 wound clean dry and intact  Disposition: Skilled nursing facility     Medication List    ASK your doctor about these medications        allopurinol 300 MG tablet  Commonly known as:  ZYLOPRIM  Take 300 mg by mouth daily.     bumetanide 1 MG tablet  Commonly known as:  BUMEX  Take 1 mg by mouth 2 (two) times daily.     calcium carbonate 500 MG chewable tablet  Commonly known as:  TUMS - dosed in mg elemental calcium  Chew 1 tablet by mouth daily as needed for indigestion or heartburn.     colchicine 0.6 MG tablet  Take 0.6 mg by mouth daily.     doxepin 25 MG capsule  Commonly known as:  SINEQUAN  Take 25 mg by mouth 2 (two) times daily.     DULoxetine 60 MG capsule  Commonly known as:  CYMBALTA  Take 60 mg by mouth 2 (two) times daily.     erythromycin ophthalmic ointment  Place 1 application into both eyes at bedtime.     esomeprazole 20 MG capsule  Commonly known as:  NEXIUM  Take 20 mg by mouth 2 (two) times daily before a meal.     loperamide 2 MG capsule  Commonly known as:  IMODIUM  Take 2 mg by mouth daily as needed for diarrhea or loose stools.     magnesium oxide 400 MG tablet  Commonly known as:  MAG-OX  Take 400 mg by mouth daily.     metFORMIN 500 MG tablet  Commonly known as:  GLUCOPHAGE  Take 500 mg by mouth daily with breakfast.     metolazone 2.5 MG tablet  Commonly known as:  ZAROXOLYN  Take 2.5 mg by mouth daily.     metoprolol succinate 50 MG 24 hr tablet  Commonly known as:  TOPROL-XL  Take 50 mg by mouth 2 (two) times daily. Take with or immediately following a meal.  metroNIDAZOLE 1 % gel  Commonly known as:  METROGEL  Apply 1 application topically 2 (two) times daily.     multivitamin with minerals Tabs tablet  Take 1 tablet by mouth 2 (two) times daily.     ofloxacin 0.3 % ophthalmic solution  Commonly known as:  OCUFLOX  Place 1 drop into both eyes 4 (four) times daily.     ondansetron 8 MG tablet  Commonly known as:  ZOFRAN  Take 8 mg by mouth every 8 (eight) hours as needed for nausea or vomiting.     OPTIVE 0.5-0.9 % Soln  Generic drug:  Carboxymethylcellul-Glycerin  Place 1 drop into both eyes daily as needed (dry eyes).      oxyCODONE-acetaminophen 5-325 MG tablet  Commonly known as:  PERCOCET/ROXICET  Take 1 tablet by mouth every 6 (six) hours as needed for pain.     polyethylene glycol packet  Commonly known as:  MIRALAX / GLYCOLAX  Take 17 g by mouth daily.     potassium chloride 10 MEQ CR capsule  Commonly known as:  MICRO-K  Take 10 mEq by mouth 2 (two) times daily.     spironolactone 50 MG tablet  Commonly known as:  ALDACTONE  Take 50 mg by mouth 2 (two) times daily.     sulfamethoxazole-trimethoprim 800-160 MG tablet  Commonly known as:  BACTRIM DS,SEPTRA DS  Take 1 tablet by mouth 2 (two) times daily.  Ask about: Should I take this medication?     triamcinolone cream 0.1 %  Commonly known as:  KENALOG  Apply 1 application topically 2 (two) times daily as needed (affected area).     zolpidem 10 MG tablet  Commonly known as:  AMBIEN  Take 5 mg by mouth at bedtime.         Signed: Wajiha Versteeg P 10/14/2015, 2:08 PM

## 2015-10-14 NOTE — Progress Notes (Signed)
Pt's son Margaretmary Bayley called to inform him that his Mother is going to [facility]

## 2015-10-14 NOTE — Progress Notes (Signed)
Report given to [place].N.

## 2015-12-27 ENCOUNTER — Encounter (HOSPITAL_BASED_OUTPATIENT_CLINIC_OR_DEPARTMENT_OTHER): Payer: Self-pay | Admitting: *Deleted

## 2015-12-27 ENCOUNTER — Emergency Department (HOSPITAL_BASED_OUTPATIENT_CLINIC_OR_DEPARTMENT_OTHER)
Admission: EM | Admit: 2015-12-27 | Discharge: 2015-12-27 | Disposition: A | Discharge status: Home / Self Care | Payer: Medicare Other | Attending: Emergency Medicine | Admitting: Emergency Medicine

## 2015-12-27 ENCOUNTER — Emergency Department (HOSPITAL_BASED_OUTPATIENT_CLINIC_OR_DEPARTMENT_OTHER): Payer: Medicare Other

## 2015-12-27 DIAGNOSIS — M1990 Unspecified osteoarthritis, unspecified site: Secondary | ICD-10-CM | POA: Insufficient documentation

## 2015-12-27 DIAGNOSIS — I10 Essential (primary) hypertension: Secondary | ICD-10-CM | POA: Diagnosis not present

## 2015-12-27 DIAGNOSIS — Z79899 Other long term (current) drug therapy: Secondary | ICD-10-CM | POA: Insufficient documentation

## 2015-12-27 DIAGNOSIS — R51 Headache: Secondary | ICD-10-CM | POA: Diagnosis present

## 2015-12-27 DIAGNOSIS — Z8669 Personal history of other diseases of the nervous system and sense organs: Secondary | ICD-10-CM | POA: Diagnosis not present

## 2015-12-27 DIAGNOSIS — G8929 Other chronic pain: Secondary | ICD-10-CM | POA: Insufficient documentation

## 2015-12-27 DIAGNOSIS — Z880 Allergy status to penicillin: Secondary | ICD-10-CM | POA: Insufficient documentation

## 2015-12-27 DIAGNOSIS — K219 Gastro-esophageal reflux disease without esophagitis: Secondary | ICD-10-CM | POA: Insufficient documentation

## 2015-12-27 DIAGNOSIS — F329 Major depressive disorder, single episode, unspecified: Secondary | ICD-10-CM | POA: Insufficient documentation

## 2015-12-27 DIAGNOSIS — Z87448 Personal history of other diseases of urinary system: Secondary | ICD-10-CM | POA: Diagnosis not present

## 2015-12-27 DIAGNOSIS — Z872 Personal history of diseases of the skin and subcutaneous tissue: Secondary | ICD-10-CM | POA: Diagnosis not present

## 2015-12-27 DIAGNOSIS — F419 Anxiety disorder, unspecified: Secondary | ICD-10-CM | POA: Insufficient documentation

## 2015-12-27 DIAGNOSIS — R410 Disorientation, unspecified: Secondary | ICD-10-CM | POA: Diagnosis not present

## 2015-12-27 DIAGNOSIS — Z85828 Personal history of other malignant neoplasm of skin: Secondary | ICD-10-CM | POA: Diagnosis not present

## 2015-12-27 DIAGNOSIS — Z792 Long term (current) use of antibiotics: Secondary | ICD-10-CM | POA: Insufficient documentation

## 2015-12-27 DIAGNOSIS — Z862 Personal history of diseases of the blood and blood-forming organs and certain disorders involving the immune mechanism: Secondary | ICD-10-CM | POA: Diagnosis not present

## 2015-12-27 DIAGNOSIS — R519 Headache, unspecified: Secondary | ICD-10-CM

## 2015-12-27 DIAGNOSIS — F4489 Other dissociative and conversion disorders: Secondary | ICD-10-CM

## 2015-12-27 LAB — COMPREHENSIVE METABOLIC PANEL
ALT: 21 U/L (ref 14–54)
AST: 30 U/L (ref 15–41)
Albumin: 4 g/dL (ref 3.5–5.0)
Alkaline Phosphatase: 111 U/L (ref 38–126)
Anion gap: 11 (ref 5–15)
BILIRUBIN TOTAL: 0.4 mg/dL (ref 0.3–1.2)
BUN: 24 mg/dL — AB (ref 6–20)
CO2: 30 mmol/L (ref 22–32)
CREATININE: 0.9 mg/dL (ref 0.44–1.00)
Calcium: 9.7 mg/dL (ref 8.9–10.3)
Chloride: 97 mmol/L — ABNORMAL LOW (ref 101–111)
Glucose, Bld: 113 mg/dL — ABNORMAL HIGH (ref 65–99)
Potassium: 3.3 mmol/L — ABNORMAL LOW (ref 3.5–5.1)
Sodium: 138 mmol/L (ref 135–145)
TOTAL PROTEIN: 7.5 g/dL (ref 6.5–8.1)

## 2015-12-27 LAB — RAPID URINE DRUG SCREEN, HOSP PERFORMED
Amphetamines: NOT DETECTED
Barbiturates: NOT DETECTED
Benzodiazepines: NOT DETECTED
COCAINE: NOT DETECTED
OPIATES: NOT DETECTED
Tetrahydrocannabinol: NOT DETECTED

## 2015-12-27 LAB — URINALYSIS, ROUTINE W REFLEX MICROSCOPIC
Bilirubin Urine: NEGATIVE
Glucose, UA: NEGATIVE mg/dL
Hgb urine dipstick: NEGATIVE
Ketones, ur: NEGATIVE mg/dL
LEUKOCYTES UA: NEGATIVE
NITRITE: NEGATIVE
PH: 6.5 (ref 5.0–8.0)
Protein, ur: NEGATIVE mg/dL
SPECIFIC GRAVITY, URINE: 1.01 (ref 1.005–1.030)

## 2015-12-27 LAB — DIFFERENTIAL
BASOS ABS: 0 10*3/uL (ref 0.0–0.1)
Basophils Relative: 1 %
EOS ABS: 0.2 10*3/uL (ref 0.0–0.7)
Eosinophils Relative: 3 %
LYMPHS ABS: 2 10*3/uL (ref 0.7–4.0)
Lymphocytes Relative: 33 %
MONO ABS: 0.7 10*3/uL (ref 0.1–1.0)
MONOS PCT: 11 %
NEUTROS ABS: 3.2 10*3/uL (ref 1.7–7.7)
Neutrophils Relative %: 52 %

## 2015-12-27 LAB — CBC
HEMATOCRIT: 40.8 % (ref 36.0–46.0)
HEMOGLOBIN: 12.8 g/dL (ref 12.0–15.0)
MCH: 27.3 pg (ref 26.0–34.0)
MCHC: 31.4 g/dL (ref 30.0–36.0)
MCV: 87 fL (ref 78.0–100.0)
Platelets: 296 10*3/uL (ref 150–400)
RBC: 4.69 MIL/uL (ref 3.87–5.11)
RDW: 14.9 % (ref 11.5–15.5)
WBC: 6.2 10*3/uL (ref 4.0–10.5)

## 2015-12-27 MED ORDER — KETOROLAC TROMETHAMINE 15 MG/ML IJ SOLN
15.0000 mg | Freq: Once | INTRAMUSCULAR | Status: AC
  Administered 2015-12-27: 15 mg via INTRAVENOUS
  Filled 2015-12-27: qty 1

## 2015-12-27 MED ORDER — SODIUM CHLORIDE 0.9 % IV BOLUS (SEPSIS)
500.0000 mL | Freq: Once | INTRAVENOUS | Status: AC
  Administered 2015-12-27: 500 mL via INTRAVENOUS

## 2015-12-27 MED ORDER — METOCLOPRAMIDE HCL 5 MG/ML IJ SOLN
10.0000 mg | Freq: Once | INTRAMUSCULAR | Status: AC
  Administered 2015-12-27: 10 mg via INTRAVENOUS
  Filled 2015-12-27: qty 2

## 2015-12-27 MED ORDER — DIPHENHYDRAMINE HCL 50 MG/ML IJ SOLN
25.0000 mg | Freq: Once | INTRAMUSCULAR | Status: AC
  Administered 2015-12-27: 25 mg via INTRAVENOUS
  Filled 2015-12-27: qty 1
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
## 2015-12-27 NOTE — Discharge Instructions (Signed)
General Headache Without Cause A headache is pain or discomfort felt around the head or neck area. The specific cause of a headache may not be found. There are many causes and types of headaches. A few common ones are:  Tension headaches.  Migraine headaches.  Cluster headaches.  Chronic daily headaches. HOME CARE INSTRUCTIONS  Watch your condition for any changes. Take these steps to help with your condition: Managing Pain  Take over-the-counter and prescription medicines only as told by your health care provider.  Lie down in a dark, quiet room when you have a headache.  If directed, apply ice to the head and neck area:  Put ice in a plastic bag.  Place a towel between your skin and the bag.  Leave the ice on for 20 minutes, 2-3 times per day.  Use a heating pad or hot shower to apply heat to the head and neck area as told by your health care provider.  Keep lights dim if bright lights bother you or make your headaches worse. Eating and Drinking  Eat meals on a regular schedule.  Limit alcohol use.  Decrease the amount of caffeine you drink, or stop drinking caffeine. General Instructions  Keep all follow-up visits as told by your health care provider. This is important.  Keep a headache journal to help find out what may trigger your headaches. For example, write down:  What you eat and drink.  How much sleep you get.  Any change to your diet or medicines.  Try massage or other relaxation techniques.  Limit stress.  Sit up straight, and do not tense your muscles.  Do not use tobacco products, including cigarettes, chewing tobacco, or e-cigarettes. If you need help quitting, ask your health care provider.  Exercise regularly as told by your health care provider.  Sleep on a regular schedule. Get 7-9 hours of sleep, or the amount recommended by your health care provider. SEEK MEDICAL CARE IF:   Your symptoms are not helped by medicine.  You have a  headache that is different from the usual headache.  You have nausea or you vomit.  You have a fever. SEEK IMMEDIATE MEDICAL CARE IF:   Your headache becomes severe.  You have repeated vomiting.  You have a stiff neck.  You have a loss of vision.  You have problems with speech.  You have pain in the eye or ear.  You have muscular weakness or loss of muscle control.  You lose your balance or have trouble walking.  You feel faint or pass out.  You have confusion.   This information is not intended to replace advice given to you by your health care provider. Make sure you discuss any questions you have with your health care provider.   Document Released: 11/16/2005 Document Revised: 08/07/2015 Document Reviewed: 03/11/2015 Elsevier Interactive Patient Education 2016 [employer].  Confusion Confusion is the inability to think with your usual speed or clarity. Confusion may come on quickly or slowly over time. How quickly the confusion comes on depends on the cause. Confusion can be due to any number of causes. CAUSES   Concussion, head injury, or head trauma.  Seizures.  Stroke.  Fever.  Brain tumor.  Age related decreased brain function (dementia).  Heightened emotional states like rage or terror.  Mental illness in which the person loses the ability to determine what is real and what is not (hallucinations).  Infections such as a urinary tract infection (UTI).  Toxic effects from  alcohol, drugs, or prescription medicines.  Dehydration and an imbalance of salts in the body (electrolytes).  Lack of sleep.  Low blood sugar (diabetes).  Low levels of oxygen from conditions such as chronic lung disorders.  Drug interactions or other medicine side effects.  Nutritional deficiencies, especially niacin, thiamine, vitamin C, or vitamin B.  Sudden drop in body temperature (hypothermia).  Change in routine, such as when traveling or hospitalized. SIGNS AND  SYMPTOMS  People often describe their thinking as cloudy or unclear when they are confused. Confusion can also include feeling disoriented. That means you are unaware of where or who you are. You may also not know what the date or time is. If confused, you may also have difficulty paying attention, remembering, and making decisions. Some people also act aggressively when they are confused.  DIAGNOSIS  The medical evaluation of confusion may include:  Blood and urine tests.  X-rays.  Brain and nervous system tests.  Analyzing your brain waves (electroencephalogram or EEG).  Magnetic resonance imaging (MRI) of your head.  Computed tomography (CT) scan of your head.  Mental status tests in which your health care provider may ask many questions. Some of these questions may seem silly or strange, but they are a very important test to help diagnose and treat confusion. TREATMENT  An admission to the hospital may not be needed, but a person with confusion should not be left alone. Stay with a family member or friend until the confusion clears. Avoid alcohol, pain relievers, or sedative drugs until you have fully recovered. Do not drive until directed by your health care provider. HOME CARE INSTRUCTIONS  What family and friends can do:  To find out if someone is confused, ask the person to state his or her name, age, and the date. If the person is unsure or answers incorrectly, he or she is confused.  Always introduce yourself, no matter how well the person knows you.  Often remind the person of his or her location.  Place a calendar and clock near the confused person.  Help the person with his or her medicines. You may want to use a pill box, an alarm as a reminder, or give the person each dose as prescribed.  Talk about current events and plans for the day.  Try to keep the environment calm, quiet, and peaceful.  Make sure the person keeps follow-up visits with his or her health care  provider. PREVENTION  Ways to prevent confusion:  Avoid alcohol.  Eat a balanced diet.  Get enough sleep.  Take medicine only as directed by your health care provider.  Do not become isolated. Spend time with other people and make plans for your days.  Keep careful watch on your blood sugar levels if you are diabetic. SEEK IMMEDIATE MEDICAL CARE IF:   You develop severe headaches, repeated vomiting, seizures, blackouts, or slurred speech.  There is increasing confusion, weakness, numbness, restlessness, or personality changes.  You develop a loss of balance, have marked dizziness, feel uncoordinated, or fall.  You have delusions, hallucinations, or develop severe anxiety.  Your family members think you need to be rechecked.   This information is not intended to replace advice given to you by your health care provider. Make sure you discuss any questions you have with your health care provider.   Document Released: 12/24/2004 Document Revised: 12/07/2014 Document Reviewed: 12/22/2013 Elsevier Interactive Patient Education [employer].

## 2015-12-27 NOTE — ED Notes (Signed)
Headache since yesterday. Trouble walking yesterday. States hx of trouble walking when she had migraines 5 years ago.

## 2016-01-10 ENCOUNTER — Other Ambulatory Visit (HOSPITAL_BASED_OUTPATIENT_CLINIC_OR_DEPARTMENT_OTHER): Payer: Self-pay | Admitting: Internal Medicine

## 2016-01-10 DIAGNOSIS — Z1231 Encounter for screening mammogram for malignant neoplasm of breast: Secondary | ICD-10-CM

## 2016-01-14 ENCOUNTER — Ambulatory Visit (HOSPITAL_BASED_OUTPATIENT_CLINIC_OR_DEPARTMENT_OTHER): Payer: Self-pay

## 2016-01-20 ENCOUNTER — Ambulatory Visit (HOSPITAL_BASED_OUTPATIENT_CLINIC_OR_DEPARTMENT_OTHER)
Admission: RE | Admit: 2016-01-20 | Discharge: 2016-01-20 | Disposition: A | Discharge status: Home / Self Care | Payer: Medicare Other | Source: Ambulatory Visit | Attending: Internal Medicine | Admitting: Internal Medicine

## 2016-01-20 DIAGNOSIS — Z1231 Encounter for screening mammogram for malignant neoplasm of breast: Secondary | ICD-10-CM | POA: Diagnosis present

## 2016-01-31 ENCOUNTER — Other Ambulatory Visit (HOSPITAL_BASED_OUTPATIENT_CLINIC_OR_DEPARTMENT_OTHER): Payer: Self-pay | Admitting: Neurosurgery

## 2016-01-31 DIAGNOSIS — M5023 Other cervical disc displacement, cervicothoracic region: Secondary | ICD-10-CM

## 2016-02-08 ENCOUNTER — Ambulatory Visit (HOSPITAL_BASED_OUTPATIENT_CLINIC_OR_DEPARTMENT_OTHER)
Admission: RE | Admit: 2016-02-08 | Discharge: 2016-02-08 | Disposition: A | Discharge status: Home / Self Care | Payer: Medicare Other | Source: Ambulatory Visit | Attending: Neurosurgery | Admitting: Neurosurgery

## 2016-02-08 DIAGNOSIS — M4802 Spinal stenosis, cervical region: Secondary | ICD-10-CM | POA: Insufficient documentation

## 2016-02-08 DIAGNOSIS — M50223 Other cervical disc displacement at C6-C7 level: Secondary | ICD-10-CM | POA: Insufficient documentation

## 2016-02-08 DIAGNOSIS — M5023 Other cervical disc displacement, cervicothoracic region: Secondary | ICD-10-CM | POA: Diagnosis present

## 2016-05-15 ENCOUNTER — Other Ambulatory Visit: Payer: Self-pay | Admitting: Neurosurgery

## 2016-05-15 ENCOUNTER — Emergency Department (HOSPITAL_BASED_OUTPATIENT_CLINIC_OR_DEPARTMENT_OTHER)
Admission: EM | Admit: 2016-05-15 | Discharge: 2016-05-15 | Disposition: A | Discharge status: Home / Self Care | Payer: Medicare Other | Attending: Emergency Medicine | Admitting: Emergency Medicine

## 2016-05-15 ENCOUNTER — Encounter (HOSPITAL_BASED_OUTPATIENT_CLINIC_OR_DEPARTMENT_OTHER): Payer: Self-pay | Admitting: *Deleted

## 2016-05-15 DIAGNOSIS — S161XXA Strain of muscle, fascia and tendon at neck level, initial encounter: Secondary | ICD-10-CM | POA: Insufficient documentation

## 2016-05-15 DIAGNOSIS — Z79899 Other long term (current) drug therapy: Secondary | ICD-10-CM | POA: Diagnosis not present

## 2016-05-15 DIAGNOSIS — G8929 Other chronic pain: Secondary | ICD-10-CM | POA: Diagnosis not present

## 2016-05-15 DIAGNOSIS — Z7984 Long term (current) use of oral hypoglycemic drugs: Secondary | ICD-10-CM | POA: Insufficient documentation

## 2016-05-15 DIAGNOSIS — W19XXXA Unspecified fall, initial encounter: Secondary | ICD-10-CM

## 2016-05-15 DIAGNOSIS — M545 Low back pain: Secondary | ICD-10-CM

## 2016-05-15 DIAGNOSIS — W01198A Fall on same level from slipping, tripping and stumbling with subsequent striking against other object, initial encounter: Secondary | ICD-10-CM | POA: Diagnosis not present

## 2016-05-15 DIAGNOSIS — C44510 Basal cell carcinoma of anal skin: Secondary | ICD-10-CM | POA: Insufficient documentation

## 2016-05-15 DIAGNOSIS — Y939 Activity, unspecified: Secondary | ICD-10-CM | POA: Insufficient documentation

## 2016-05-15 DIAGNOSIS — E785 Hyperlipidemia, unspecified: Secondary | ICD-10-CM | POA: Insufficient documentation

## 2016-05-15 DIAGNOSIS — Y9289 Other specified places as the place of occurrence of the external cause: Secondary | ICD-10-CM | POA: Diagnosis not present

## 2016-05-15 DIAGNOSIS — S39012A Strain of muscle, fascia and tendon of lower back, initial encounter: Secondary | ICD-10-CM | POA: Insufficient documentation

## 2016-05-15 DIAGNOSIS — M542 Cervicalgia: Secondary | ICD-10-CM | POA: Diagnosis present

## 2016-05-15 DIAGNOSIS — Y999 Unspecified external cause status: Secondary | ICD-10-CM | POA: Insufficient documentation

## 2016-05-15 DIAGNOSIS — I10 Essential (primary) hypertension: Secondary | ICD-10-CM | POA: Insufficient documentation

## 2016-05-15 DIAGNOSIS — F329 Major depressive disorder, single episode, unspecified: Secondary | ICD-10-CM | POA: Insufficient documentation

## 2016-05-15 MED ORDER — ORPHENADRINE CITRATE ER 100 MG PO TB12: 100.0000 mg | ORAL_TABLET | Freq: Two times a day (BID) | ORAL | Status: DC

## 2016-05-15 MED ORDER — NAPROXEN 500 MG PO TABS: 500.0000 mg | ORAL_TABLET | Freq: Two times a day (BID) | ORAL | Status: DC

## 2016-05-15 MED ORDER — TIZANIDINE HCL 4 MG PO TABS: 4.0000 mg | ORAL_TABLET | Freq: Four times a day (QID) | ORAL | Status: DC | PRN

## 2016-05-15 MED FILL — tiZANidine HCL 4 MG TABS: 4 | 7 days supply | Qty: 30 | Fill #0

## 2016-05-15 MED FILL — NAPROXEN 500 MG TABLET: 500 | 15 days supply | Qty: 30 | Fill #0

## 2016-05-15 NOTE — Discharge Instructions (Signed)
Cervical Sprain A cervical sprain is an injury in the neck in which the strong, fibrous tissues (ligaments) that connect your neck bones stretch or tear. Cervical sprains can range from mild to severe. Severe cervical sprains can cause the neck vertebrae to be unstable. This can lead to damage of the spinal cord and can result in serious nervous system problems. The amount of time it takes for a cervical sprain to get better depends on the cause and extent of the injury. Most cervical sprains heal in 1 to 3 weeks. CAUSES  Severe cervical sprains may be caused by:   Contact sport injuries (such as from football, rugby, wrestling, hockey, auto racing, gymnastics, diving, martial arts, or boxing).   Motor vehicle collisions.   Whiplash injuries. This is an injury from a sudden forward and backward whipping movement of the head and neck.  Falls.  Mild cervical sprains may be caused by:   Being in an awkward position, such as while cradling a telephone between your ear and shoulder.   Sitting in a chair that does not offer proper support.   Working at a poorly Landscape [employer] station.   Looking up or down for long periods of time.  SYMPTOMS   Pain, soreness, stiffness, or a burning sensation in the front, back, or sides of the neck. This discomfort may develop immediately after the injury or slowly, 24 hours or more after the injury.   Pain or tenderness directly in the middle of the back of the neck.   Shoulder or upper back pain.   Limited ability to move the neck.   Headache.   Dizziness.   Weakness, numbness, or tingling in the hands or arms.   Muscle spasms.   Difficulty swallowing or chewing.   Tenderness and swelling of the neck.  DIAGNOSIS  Most of the time your health care provider can diagnose a cervical sprain by taking your history and doing a physical exam. Your health care provider will ask about previous neck injuries and any known neck  problems, such as arthritis in the neck. X-rays may be taken to find out if there are any other problems, such as with the bones of the neck. Other tests, such as a CT scan or MRI, may also be needed.  TREATMENT  Treatment depends on the severity of the cervical sprain. Mild sprains can be treated with rest, keeping the neck in place (immobilization), and pain medicines. Severe cervical sprains are immediately immobilized. Further treatment is done to help with pain, muscle spasms, and other symptoms and may include:  Medicines, such as pain relievers, numbing medicines, or muscle relaxants.   Physical therapy. This may involve stretching exercises, strengthening exercises, and posture training. Exercises and improved posture can help stabilize the neck, strengthen muscles, and help stop symptoms from returning.  HOME CARE INSTRUCTIONS   Put ice on the injured area.   Put ice in a plastic bag.   Place a towel between your skin and the bag.   Leave the ice on for 15-20 minutes, 3-4 times a day.   If your injury was severe, you may have been given a cervical collar to wear. A cervical collar is a two-piece collar designed to keep your neck from moving while it heals.  Do not remove the collar unless instructed by your health care provider.  If you have long hair, keep it outside of the collar.  Ask your health care provider before making any adjustments to your collar. Minor  adjustments may be required over time to improve comfort and reduce pressure on your chin or on the back of your head.  Ifyou are allowed to remove the collar for cleaning or bathing, follow your health care provider's instructions on how to do so safely.  Keep your collar clean by wiping it with mild soap and water and drying it completely. If the collar you have been given includes removable pads, remove them every 1-2 days and hand wash them with soap and water. Allow them to air dry. They should be completely  dry before you wear them in the collar.  If you are allowed to remove the collar for cleaning and bathing, wash and dry the skin of your neck. Check your skin for irritation or sores. If you see any, tell your health care provider.  Do not drive while wearing the collar.   Only take over-the-counter or prescription medicines for pain, discomfort, or fever as directed by your health care provider.   Keep all follow-up appointments as directed by your health care provider.   Keep all physical therapy appointments as directed by your health care provider.   Make any needed adjustments to your workstation to promote good posture.   Avoid positions and activities that make your symptoms worse.   Warm up and stretch before being active to help prevent problems.  SEEK MEDICAL CARE IF:   Your pain is not controlled with medicine.   You are unable to decrease your pain medicine over time as planned.   Your activity level is not improving as expected.  SEEK IMMEDIATE MEDICAL CARE IF:   You develop any bleeding.  You develop stomach upset.  You have signs of an allergic reaction to your medicine.   Your symptoms get worse.   You develop new, unexplained symptoms.   You have numbness, tingling, weakness, or paralysis in any part of your body.  MAKE SURE YOU:   Understand these instructions.  Will watch your condition.  Will get help right away if you are not doing well or get worse.   This information is not intended to replace advice given to you by your health care provider. Make sure you discuss any questions you have with your health care provider.   Document Released: 09/13/2007 Document Revised: 11/21/2013 Document Reviewed: 05/24/2013 Elsevier Interactive Patient Education 2016 [employer] Lumbosacral Radiculopathy Lumbosacral radiculopathy is a condition that involves the spinal nerves and nerve roots in the low back and bottom of the spine. The  condition develops when these nerves and nerve roots move out of place or become inflamed and cause symptoms. CAUSES This condition may be caused by:  Pressure from a disk that bulges out of place (herniated disk). A disk is a plate of cartilage that separates bones in the spine.  Disk degeneration.  A narrowing of the bones of the lower back (spinal stenosis).  A tumor.  An infection.  An injury that places sudden pressure on the disks that cushion the bones of your lower spine. RISK FACTORS This condition is more likely to develop in:  Males aged 30-50 years.  Females aged 41-60 years.  People who lift improperly.  People who are overweight or live a sedentary lifestyle.  People who smoke.  People who perform repetitive activities that strain the spine. SYMPTOMS Symptoms of this condition include:  Pain that goes down from the back into the legs (sciatica). This is the most common symptom. The pain may be worse with sitting,  coughing, or sneezing.  Pain and numbness in the arms and legs.  Muscle weakness.  Tingling.  Loss of bladder control or bowel control. DIAGNOSIS This condition is diagnosed with a physical exam and medical history. If the pain is lasting, you may have tests, such as:  MRI scan.  X-ray.  CT scan.  Myelogram.  Nerve conduction study. TREATMENT This condition is often treated with:  Hot packs and ice applied to affected areas.  Stretches to improve flexibility.  Exercises to strengthen back muscles.  Physical therapy.  Pain medicine.  A steroid injection in the spine. In some cases, no treatment is needed. If the condition is long-lasting (chronic), or if symptoms are severe, treatment may involve surgery or lifestyle changes, such as following a weight loss plan. HOME CARE INSTRUCTIONS Medicines  Take medicines only as directed by your health care provider.  Do not drive or operate heavy machinery while taking pain  medicine. Injury Care  Apply a heat pack to the injured area as directed by your health care provider.  Apply ice to the affected area:  Put ice in a plastic bag.  Place a towel between your skin and the bag.  Leave the ice on for 20-30 minutes, every 2 hours while you are awake or as needed. Or, leave the ice on for as long as directed by your health care provider. Other Instructions  If you were shown how to do any exercises or stretches, do them as directed by your health care provider.  If your health care provider prescribed a diet or exercise program, follow it as directed.  Keep all follow-up visits as directed by your health care provider. This is important. SEEK MEDICAL CARE IF:  Your pain does not improve over time even when taking pain medicines. SEEK IMMEDIATE MEDICAL CARE IF:  Your develop severe pain.  Your pain suddenly gets worse.  You develop increasing weakness in your legs.  You lose the ability to control your bladder or bowel.  You have difficulty walking or balancing.  You have a fever.   This information is not intended to replace advice given to you by your health care provider. Make sure you discuss any questions you have with your health care provider.   Document Released: 11/16/2005 Document Revised: 04/02/2015 Document Reviewed: 11/12/2014 Elsevier Interactive Patient Education [employer].

## 2016-05-15 NOTE — ED Provider Notes (Signed)
CSN: [id number]     Arrival date & time 05/15/16  1359 History   First MD Initiated Contact with Patient 05/15/16 1520     Chief Complaint  Patient presents with  . Fall     (Consider location/radiation/quality/duration/timing/severity/associated sxs/prior Treatment) HPI The patient reports earlier this week (she cannot recall exactly what day) she fell outside of [address]. She reports she had her arms full and she walked into a raised parking barrier. That caused her to fall backwards. She states she first hit her head and upper back on the wall of the building and then fell to the ground. She was not knocked out. She states at the time the injury did not seem too severe. She reports over the course of the past several days however her pain has been worsening. Patient does report that she has chronic cervical pain and lumbar pain. She reports she is due to get a cervical surgery this summer with Dr. Saintclair Halsted. She called their office today and Dr. Saintclair Halsted is out of town. They referred her to the emergency department for evaluation. Patient states that she has frequently had problems with tingling in her hands. She had gotten injections in her wrists and that it improved. She reports it does seem to be getting worse again no. She has not had weakness or difficulty holding her gripping objects. She is ambulatory with same gait is baseline. She reports that she has problems with chronic lower back pain as well. She reports that she is getting some radiation from her left lower back into her left leg. This has not changed her gait or her function. No bowel or bladder dysfunction. Patient reports now she is only taking ibuprofen for pain. She reports that she used to be under pain management but she has weaned herself off of oxycodone. She does not currently have any muscle relaxers that she is taking either. Past Medical History  Diagnosis Date  . Hypertension   . Depression   . Anxiety   . H/O hiatal hernia    . Arthritis   . Cellulitis and abscess of leg     last yr  . SVT (supraventricular tachycardia) ([place])     on pt's medical record from PCP - pt doesn't remember  . Sleep apnea     does not use Cpap - couldn't keep mask on.  . Hyperlipidemia   . Abnormal kidney function     with extremity swelling  . Seizures ([place])      x 2 with high fever as an adult  . Neuropathy ([place])   . Cancer ([place])     skin cancer  . Low iron     after a miscarriage  . Pancreatitis   . GERD (gastroesophageal reflux disease)   . Headache     Tension headaches  . Chronic pain    Past Surgical History  Procedure Laterality Date  . Cesarean section      x 3  . Hernia repair    . Abdominal hysterectomy    . Gastric bypass      minigastric bypass 2006 in [facility]  . Joint replacement      bil knee  . Tonsillectomy    . Back surgery      02/06/2013  . Breath tek h pylori N/A 06/18/2014    Procedure: BREATH TEK H PYLORI;  Surgeon: Pedro Earls, MD;  Location: [facility];  Service: General;  Laterality: N/A;  . Eye surgery Bilateral  cataract surgery  . Laparoscopic bilateral salpingo oopherectomy     Family History  Problem Relation Age of Onset  . COPD Mother   . Cancer Mother   . Hypertension Sister   . Hyperlipidemia Sister   . Obesity Sister   . Heart failure Father    Social History  Substance Use Topics  . Smoking status: Never Smoker   . Smokeless tobacco: Never Used  . Alcohol Use: Yes     Comment: occ   OB History    No data available     Review of Systems  10 Systems reviewed and are negative for acute change except as noted in the HPI.   Allergies  Bee venom; Erythrocin; Fioricet; Other; Penicillins; Clindamycin/lincomycin; Hydrocodone-acetaminophen; Morphine and related; Phenobarbital; Teflaro; and Vancomycin  Home Medications   Prior to Admission medications   Medication Sig Start Date End Date Taking? Authorizing Provider  allopurinol (ZYLOPRIM) 300 MG  tablet Take 300 mg by mouth daily.    Historical Provider, MD  bumetanide (BUMEX) 1 MG tablet Take 1 mg by mouth 2 (two) times daily.    Historical Provider, MD  calcium carbonate (TUMS - DOSED IN MG ELEMENTAL CALCIUM) 500 MG chewable tablet Chew 1 tablet by mouth daily as needed for indigestion or heartburn.     Historical Provider, MD  colchicine 0.6 MG tablet Take 0.6 mg by mouth daily.    Historical Provider, MD  doxepin (SINEQUAN) 25 MG capsule Take 25 mg by mouth 2 (two) times daily.    Historical Provider, MD  DULoxetine (CYMBALTA) 60 MG capsule Take 60 mg by mouth 2 (two) times daily.    Historical Provider, MD  erythromycin ophthalmic ointment Place 1 application into both eyes at bedtime.    Historical Provider, MD  esomeprazole (NEXIUM) 20 MG capsule Take 20 mg by mouth 2 (two) times daily before a meal.     Historical Provider, MD  loperamide (IMODIUM) 2 MG capsule Take 2 mg by mouth daily as needed for diarrhea or loose stools.    Historical Provider, MD  magnesium oxide (MAG-OX) 400 MG tablet Take 400 mg by mouth daily.    Historical Provider, MD  metFORMIN (GLUCOPHAGE) 500 MG tablet Take 500 mg by mouth daily with breakfast.    Historical Provider, MD  metolazone (ZAROXOLYN) 2.5 MG tablet Take 2.5 mg by mouth daily. 07/31/14   Historical Provider, MD  metoprolol succinate (TOPROL-XL) 50 MG 24 hr tablet Take 50 mg by mouth 2 (two) times daily. Take with or immediately following a meal.    Historical Provider, MD  metroNIDAZOLE (METROGEL) 1 % gel Apply 1 application topically 2 (two) times daily.    Historical Provider, MD  Multiple Vitamin (MULTIVITAMIN WITH MINERALS) TABS Take 1 tablet by mouth 2 (two) times daily.    Historical Provider, MD  naproxen (NAPROSYN) 500 MG tablet Take 1 tablet (500 mg total) by mouth 2 (two) times daily. 05/15/16   Charlesetta Shanks, MD  ofloxacin (OCUFLOX) 0.3 % ophthalmic solution Place 1 drop into both eyes 4 (four) times daily. 09/19/15   Historical  Provider, MD  ondansetron (ZOFRAN) 8 MG tablet Take 8 mg by mouth every 8 (eight) hours as needed for nausea or vomiting.    Historical Provider, MD  OPTIVE 0.5-0.9 % SOLN Place 1 drop into both eyes daily as needed (dry eyes).  09/16/15   Historical Provider, MD  orphenadrine (NORFLEX) 100 MG tablet Take 1 tablet (100 mg total) by mouth 2 (two) times  daily. 05/15/16   Charlesetta Shanks, MD  oxyCODONE-acetaminophen (PERCOCET/ROXICET) 5-325 MG per tablet Take 1 tablet by mouth every 6 (six) hours as needed for pain.    Historical Provider, MD  polyethylene glycol (MIRALAX / GLYCOLAX) packet Take 17 g by mouth daily.    Historical Provider, MD  potassium chloride (MICRO-K) 10 MEQ CR capsule Take 10 mEq by mouth 2 (two) times daily.    Historical Provider, MD  spironolactone (ALDACTONE) 50 MG tablet Take 50 mg by mouth 2 (two) times daily.     Historical Provider, MD  traMADol (ULTRAM) 50 MG tablet Take 1 tablet (50 mg total) by mouth every 6 (six) hours as needed for moderate pain. 10/14/15   Kary Kos, MD  triamcinolone cream (KENALOG) 0.1 % Apply 1 application topically 2 (two) times daily as needed (affected area).    Historical Provider, MD  zolpidem (AMBIEN) 10 MG tablet Take 5 mg by mouth at bedtime.     Historical Provider, MD   BP 134/64 mmHg  Pulse 72  Temp(Src) 98.5 F (36.9 C) (Oral)  Resp 20  Ht 5\' 1"  (1.549 m)  Wt 182 lb (82.555 kg)  BMI 34.41 kg/m2  SpO2 100% Physical Exam  Constitutional: She is oriented to person, place, and time. She appears well-developed and well-nourished.  HENT:  Head: Normocephalic and atraumatic.  Eyes: EOM are normal. Pupils are equal, round, and reactive to light.  Neck: Neck supple.  Patient endorses tenderness to palpation over the entire posterior neck. She endorses pain from the base of her skull to her mid thoracic spine. She also endorses pain over bilateral paraspinous muscle bodies. Range of motion is normal. Patient does not have meningismus or  torticollis.  Cardiovascular: Normal rate, regular rhythm, normal heart sounds and intact distal pulses.   Pulmonary/Chest: Effort normal and breath sounds normal.  Abdominal: Soft. Bowel sounds are normal. She exhibits no distension. There is no tenderness.  Musculoskeletal: Normal range of motion. She exhibits no edema or tenderness.  Neurological: She is alert and oriented to person, place, and time. She has normal strength. No cranial nerve deficit. She exhibits normal muscle tone. Coordination normal. GCS eye subscore is 4. GCS verbal subscore is 5. GCS motor subscore is 6.  Patient has excellent bilateral upper extremity strength. Grip strength and push and pull are 5/5. Sensation intact to light touch. The patient is able to go from seated in a chair to standing and ambulating with coordinated gait. There is no gait asymmetry or weakness.  Skin: Skin is warm, dry and intact.  Psychiatric: She has a normal mood and affect.    ED Course  Procedures (including critical care time) Labs Review Labs Reviewed - No data to display  Imaging Review No results found. I have personally reviewed and evaluated these images and lab results as part of my medical decision-making.   EKG Interpretation None      MDM   Final diagnoses:  Fall, initial encounter  Cervical strain, initial encounter  Lumbar strain, initial encounter  Acute exacerbation of chronic low back pain   Patient resents with history of fall several days earlier. At this time she is neurologically intact with good motor function. The patient has chronic pain from prior disc herniations and lumbar surgeries. At this time findings are consistent with an acute exacerbation of chronic pain. There is no localizing bony tenderness to suggest acute fracture. Plan will be to treat the patient with naproxen and Norflex for musculoskeletal pain  and patient is advised to follow-up with Dr. Saintclair Halsted next week for repeat check.    Charlesetta Shanks, MD 05/15/16 1538

## 2016-05-15 NOTE — ED Notes (Signed)
She tripped and fell earlier in the week. Her head hit the side of a building. No LOC. Headache, neck pain, left shoulder and back pain with radiation into her left leg.

## 2016-05-26 ENCOUNTER — Inpatient Hospital Stay (HOSPITAL_COMMUNITY): Admission: RE | Admit: 2016-05-26 | Payer: Self-pay | Source: Ambulatory Visit

## 2016-06-01 ENCOUNTER — Inpatient Hospital Stay (HOSPITAL_COMMUNITY): Admission: RE | Admit: 2016-06-01 | Payer: Medicare Other | Source: Ambulatory Visit | Admitting: Neurosurgery

## 2016-06-01 ENCOUNTER — Encounter (HOSPITAL_COMMUNITY): Admission: RE | Payer: Self-pay | Source: Ambulatory Visit

## 2016-06-01 SURGERY — ANTERIOR CERVICAL DECOMPRESSION/DISCECTOMY FUSION 3 LEVELS
Anesthesia: General

## 2016-07-03 ENCOUNTER — Other Ambulatory Visit (HOSPITAL_BASED_OUTPATIENT_CLINIC_OR_DEPARTMENT_OTHER): Payer: Self-pay | Admitting: Neurosurgery

## 2016-07-03 DIAGNOSIS — M5137 Other intervertebral disc degeneration, lumbosacral region: Secondary | ICD-10-CM

## 2016-07-04 ENCOUNTER — Ambulatory Visit (HOSPITAL_BASED_OUTPATIENT_CLINIC_OR_DEPARTMENT_OTHER)
Admission: RE | Admit: 2016-07-04 | Discharge: 2016-07-04 | Disposition: A | Discharge status: Home / Self Care | Payer: Medicare Other | Source: Ambulatory Visit | Attending: Neurosurgery | Admitting: Neurosurgery

## 2016-07-04 DIAGNOSIS — I700 Atherosclerosis of aorta: Secondary | ICD-10-CM | POA: Insufficient documentation

## 2016-07-04 DIAGNOSIS — M5137 Other intervertebral disc degeneration, lumbosacral region: Secondary | ICD-10-CM | POA: Insufficient documentation

## 2016-07-04 DIAGNOSIS — M5126 Other intervertebral disc displacement, lumbar region: Secondary | ICD-10-CM | POA: Insufficient documentation

## 2016-07-08 ENCOUNTER — Other Ambulatory Visit: Payer: Self-pay | Admitting: Neurosurgery

## 2016-07-15 ENCOUNTER — Encounter (HOSPITAL_COMMUNITY)
Admission: RE | Admit: 2016-07-15 | Discharge: 2016-07-15 | Disposition: A | Discharge status: Home / Self Care | Payer: Medicare Other | Source: Ambulatory Visit | Attending: Neurosurgery | Admitting: Neurosurgery

## 2016-07-15 ENCOUNTER — Encounter (HOSPITAL_COMMUNITY): Payer: Self-pay

## 2016-07-15 DIAGNOSIS — E785 Hyperlipidemia, unspecified: Secondary | ICD-10-CM | POA: Diagnosis not present

## 2016-07-15 DIAGNOSIS — Z01818 Encounter for other preprocedural examination: Secondary | ICD-10-CM | POA: Diagnosis not present

## 2016-07-15 DIAGNOSIS — M4802 Spinal stenosis, cervical region: Secondary | ICD-10-CM | POA: Insufficient documentation

## 2016-07-15 DIAGNOSIS — Z79899 Other long term (current) drug therapy: Secondary | ICD-10-CM | POA: Insufficient documentation

## 2016-07-15 DIAGNOSIS — M5030 Other cervical disc degeneration, unspecified cervical region: Secondary | ICD-10-CM | POA: Insufficient documentation

## 2016-07-15 DIAGNOSIS — E669 Obesity, unspecified: Secondary | ICD-10-CM | POA: Insufficient documentation

## 2016-07-15 DIAGNOSIS — Z01812 Encounter for preprocedural laboratory examination: Secondary | ICD-10-CM | POA: Insufficient documentation

## 2016-07-15 DIAGNOSIS — K219 Gastro-esophageal reflux disease without esophagitis: Secondary | ICD-10-CM | POA: Insufficient documentation

## 2016-07-15 DIAGNOSIS — Z7984 Long term (current) use of oral hypoglycemic drugs: Secondary | ICD-10-CM | POA: Diagnosis not present

## 2016-07-15 DIAGNOSIS — Z6835 Body mass index (BMI) 35.0-35.9, adult: Secondary | ICD-10-CM | POA: Diagnosis not present

## 2016-07-15 DIAGNOSIS — Z9884 Bariatric surgery status: Secondary | ICD-10-CM | POA: Diagnosis not present

## 2016-07-15 DIAGNOSIS — Z981 Arthrodesis status: Secondary | ICD-10-CM | POA: Insufficient documentation

## 2016-07-15 DIAGNOSIS — G4733 Obstructive sleep apnea (adult) (pediatric): Secondary | ICD-10-CM | POA: Insufficient documentation

## 2016-07-15 DIAGNOSIS — E1142 Type 2 diabetes mellitus with diabetic polyneuropathy: Secondary | ICD-10-CM | POA: Insufficient documentation

## 2016-07-15 DIAGNOSIS — F329 Major depressive disorder, single episode, unspecified: Secondary | ICD-10-CM | POA: Diagnosis not present

## 2016-07-15 DIAGNOSIS — Z96653 Presence of artificial knee joint, bilateral: Secondary | ICD-10-CM | POA: Insufficient documentation

## 2016-07-15 DIAGNOSIS — I10 Essential (primary) hypertension: Secondary | ICD-10-CM | POA: Insufficient documentation

## 2016-07-15 HISTORY — DX: Pain in unspecified joint: M25.50

## 2016-07-15 HISTORY — DX: Constipation, unspecified: K59.00

## 2016-07-15 HISTORY — DX: Personal history of colonic polyps: Z86.010

## 2016-07-15 HISTORY — DX: Personal history of colon polyps, unspecified: Z86.0100

## 2016-07-15 HISTORY — DX: Other chronic pain: G89.29

## 2016-07-15 HISTORY — DX: Personal history of other infectious and parasitic diseases: Z86.19

## 2016-07-15 HISTORY — DX: Personal history of other diseases of the musculoskeletal system and connective tissue: Z87.39

## 2016-07-15 HISTORY — DX: Dorsalgia, unspecified: M54.9

## 2016-07-15 HISTORY — DX: Personal history of other diseases of the respiratory system: Z87.09

## 2016-07-15 HISTORY — DX: Overactive bladder: N32.81

## 2016-07-15 HISTORY — DX: Polyneuropathy, unspecified: G62.9

## 2016-07-15 HISTORY — DX: Type 2 diabetes mellitus without complications: E11.9

## 2016-07-15 HISTORY — DX: Other difficulties with micturition: R39.198

## 2016-07-15 LAB — CBC
HCT: 43.7 % (ref 36.0–46.0)
Hemoglobin: 13.7 g/dL (ref 12.0–15.0)
MCH: 27.6 pg (ref 26.0–34.0)
MCHC: 31.4 g/dL (ref 30.0–36.0)
MCV: 87.9 fL (ref 78.0–100.0)
PLATELETS: 273 10*3/uL (ref 150–400)
RBC: 4.97 MIL/uL (ref 3.87–5.11)
RDW: 13.5 % (ref 11.5–15.5)
WBC: 9.1 10*3/uL (ref 4.0–10.5)

## 2016-07-15 LAB — BASIC METABOLIC PANEL
Anion gap: 13 (ref 5–15)
BUN: 17 mg/dL (ref 6–20)
CALCIUM: 9.5 mg/dL (ref 8.9–10.3)
CO2: 31 mmol/L (ref 22–32)
CREATININE: 0.97 mg/dL (ref 0.44–1.00)
Chloride: 97 mmol/L — ABNORMAL LOW (ref 101–111)
GFR calc Af Amer: >60 mL/min (ref >60)
GFR, EST NON AFRICAN AMERICAN: 59 mL/min — AB (ref >60)
GLUCOSE: 128 mg/dL — AB (ref 65–99)
Potassium: 3 mmol/L — ABNORMAL LOW (ref 3.5–5.1)
SODIUM: 141 mmol/L (ref 135–145)

## 2016-07-15 LAB — SURGICAL PCR SCREEN
MRSA, PCR: NEGATIVE
STAPHYLOCOCCUS AUREUS: NEGATIVE

## 2016-07-15 LAB — GLUCOSE, CAPILLARY: GLUCOSE-CAPILLARY: 129 mg/dL — AB (ref 65–99)

## 2016-07-15 MED ORDER — CHLORHEXIDINE GLUCONATE CLOTH 2 % EX PADS: 6.0000 | MEDICATED_PAD | Freq: Once | CUTANEOUS | Status: DC

## 2016-07-15 NOTE — Progress Notes (Addendum)
Cardiologist is Dr.Al-Khori-clearance in chart  Medical Md is Dr.Stephen Asher Muir  Echo report in epic from 2017  Stress test denies  Heart cath denies  EKG in epic from 09-25-15  CXR in epic from 12-27-15

## 2016-07-15 NOTE — Pre-Procedure Instructions (Signed)
[name] [name]  07/15/2016      [facility] [phone] - [place], [place] - [address] [place] [address] Phone: [phone] Fax: [phone]    Your procedure is scheduled on Mon, Aug 21 @ 1:40 PM  Report to [facility] Admitting at 11:40 AM  Call this number if you have problems the morning of surgery:  [phone]   Remember:  Do not eat food or drink liquids after midnight.  Take these medicines the morning of surgery with A SIP OF WATER Allopurinol(Zyloprim),Colchicine,Cymbalta(Duloxetine),Nexium(Esomeprazole),Metoprolol(Toprol),Eye Drops,Ondansetron(Zofran-if needed),and Tramadol(Ultram-if needed)              Stop taking any Vitamins or Herbal Medications a week prior to surgery. No Goody's,BC's,Aleve,Advil,Motrin,Ibuprofen,or Fish Oil.       How to Manage Your Diabetes Before and After Surgery  Why is it important to control my blood sugar before and after surgery? . Improving blood sugar levels before and after surgery helps healing and can limit problems. . A way of improving blood sugar control is eating a healthy diet by: o  Eating less sugar and carbohydrates o  Increasing activity/exercise o  Talking with your doctor about reaching your blood sugar goals . High blood sugars (greater than 180 mg/dL) can raise your risk of infections and slow your recovery, so you will need to focus on controlling your diabetes during the weeks before surgery. . Make sure that the doctor who takes care of your diabetes knows about your planned surgery including the date and location.  How do I manage my blood sugar before surgery? . Check your blood sugar at least 4 times a day, starting 2 days before surgery, to make sure that the level is not too high or low. o Check your blood sugar the morning of your surgery when you wake up and every 2 hours until you get to the Short Stay unit. . If your blood sugar is  less than 70 mg/dL, you will need to treat for low blood sugar: o Do not take insulin. o Treat a low blood sugar (less than 70 mg/dL) with  cup of clear juice (cranberry or apple), 4 glucose tablets, OR glucose gel. o Recheck blood sugar in 15 minutes after treatment (to make sure it is greater than 70 mg/dL). If your blood sugar is not greater than 70 mg/dL on recheck, call [phone] for further instructions. . Report your blood sugar to the short stay nurse when you get to Short Stay.  . If you are admitted to the hospital after surgery: o Your blood sugar will be checked by the staff and you will probably be given insulin after surgery (instead of oral diabetes medicines) to make sure you have good blood sugar levels. o The goal for blood sugar control after surgery is 80-180 mg/dL.              WHAT DO I DO ABOUT MY DIABETES MEDICATION?   Marland Kitchen Do not take oral diabetes medicines (pills) the morning of surgery.       . The day of surgery, do not take other diabetes injectables, including Byetta (exenatide), Bydureon (exenatide ER), Victoza (liraglutide), or Trulicity (dulaglutide).  . If your CBG is greater than 220 mg/dL, you may take  of your sliding scale (correction) dose of insulin.  Other Instructions:          Patient Signature:  Date:  Nurse Signature:  Date:   Reviewed and Endorsed by [facility] Patient Education Committee, August 2015  Do not wear jewelry, make-up or nail polish.  Do not wear lotions, powders, or perfumes.    Do not shave 48 hours prior to surgery.      Do not bring valuables to the hospital.  [facility] is not responsible for any belongings or valuables.  Contacts, dentures or bridgework may not be worn into surgery.  Leave your suitcase in the car.  After surgery it may be brought to your room.  For patients admitted to the hospital, discharge time will be determined by your treatment team.  Patients discharged the day of  surgery will not be allowed to drive home.   [facility]  Before surgery, you can play an important role.  Because skin is not sterile, your skin needs to be as free of germs as possible.  You can reduce the number of germs on you skin by washing with CHG (chlorahexidine gluconate) soap before surgery.  CHG is an antiseptic cleaner which kills germs and bonds with the skin to continue killing germs even after washing.  Please DO NOT use if you have an allergy to CHG or antibacterial soaps.  If your skin becomes reddened/irritated stop using the CHG and inform your nurse when you arrive at Short Stay.  Do not shave (including legs and underarms) for at least 48 hours prior to the first CHG shower.  You may shave your face.  Please follow these instructions carefully:   1.  Shower with CHG Soap the night before surgery and the                                morning of Surgery.  2.  If you choose to wash your hair, wash your hair first as usual with your       normal shampoo.  3.  After you shampoo, rinse your hair and body thoroughly to remove the                      Shampoo.  4.  Use CHG as you would any other liquid soap.  You can apply chg directly       to the skin and wash gently with scrungie or a clean washcloth.  5.  Apply the CHG Soap to your body ONLY FROM THE NECK DOWN.        Do not use on open wounds or open sores.  Avoid contact with your eyes,       ears, mouth and genitals (private parts).  Wash genitals (private parts)       with your normal soap.  6.  Wash thoroughly, paying special attention to the area where your surgery        will be performed.  7.  Thoroughly rinse your body with warm water from the neck down.  8.  DO NOT shower/wash with your normal soap after using and rinsing off       the CHG Soap.  9.  Pat yourself dry with a clean towel.            10.  Wear clean pajamas.            11.  Place clean sheets on your bed the  night of your first shower and do not  sleep with pets.  Day of Surgery  Do not apply any lotions/deoderants the morning of surgery.  Please wear clean clothes to the hospital/surgery center.    Please read over the following fact sheets that you were given. Pain Booklet, Coughing and Deep Breathing, MRSA Information and Surgical Site Infection Prevention

## 2016-07-16 LAB — HEMOGLOBIN A1C
Hgb A1c MFr Bld: 6.3 % — ABNORMAL HIGH (ref 4.8–5.6)
MEAN PLASMA GLUCOSE: 134 mg/dL

## 2016-07-16 IMAGING — RF DG C-ARM 61-120 MIN
1 series · 2 of 2 positions shown · non-contrast
Comparison: 09/26/2015 abdominal series

CLINICAL DATA: L3-4 posterior fusion, hardware removal

EXAM:
DG C-ARM 61-120 MIN; LUMBAR SPINE - 2-3 VIEW

[Series 1: run · 2 of 2 slices shown]
[im 1/2]
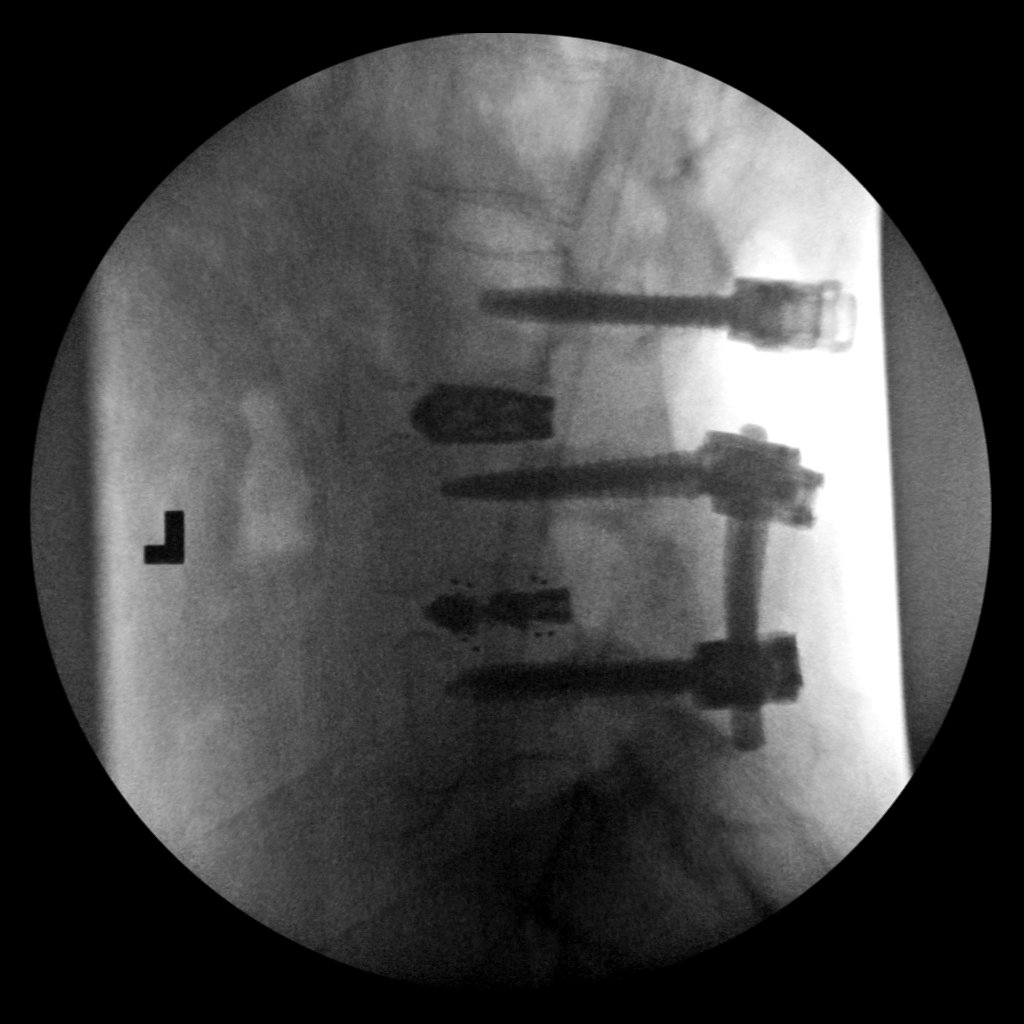
[im 2/2]
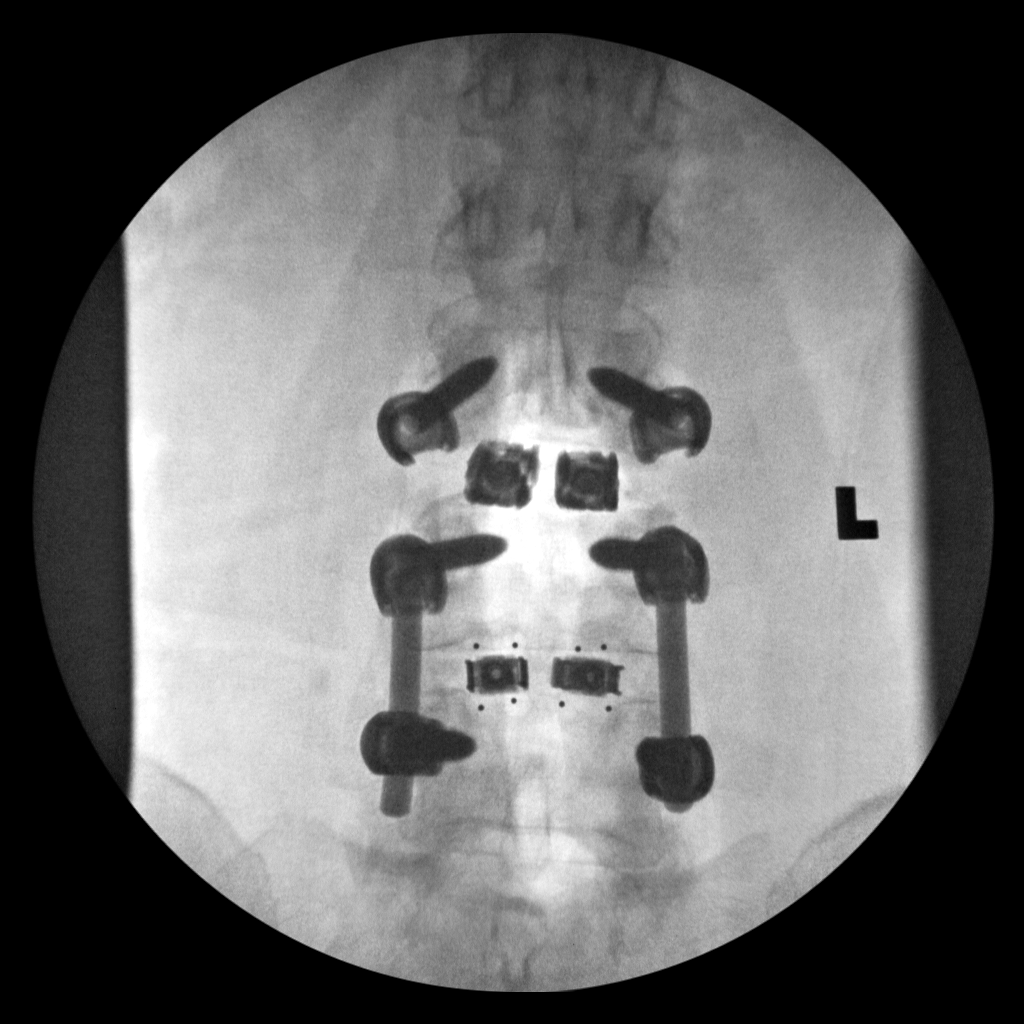

[2 of 2 positions shown; findings below may reference images not displayed]

FINDINGS: Two intraoperative spot images demonstrate remote hardware at L4-5.
Interval placement of disc spacer at L3-4 and posterior pedicle
screws at L3. No hardware complicating feature.
IMPRESSION: interval posterior fusion changes at L3-4 as above.

## 2016-07-16 NOTE — Progress Notes (Signed)
Anesthesia Chart Review: Patient is a 65 year old female scheduled for C4-5, C5-6, C6-7 ACDF on 07/20/16 by Dr. Saintclair Halsted.  PMH includes non-smoker, HTN, HLD, DM2, peripheral neuropathy, hyperlipidemia, SVT (during pregnancy), pancreatitis, OSA (not on CPAP), depression, GERD, overactive bladder, seizures (none in > 2 years), gastric bypass, tonsillectomy, hernia repair, bilateral TKA, hysterectomy, cholecystectomy '16, L4-5 PLIF '14, L3-4 PLIF 10/04/15, obesity (BMI 36). She was admitted to [facility] in 03/2016 for altered mental status (possible syncope) and rash. She apparently was initially seen in [place] and thought to have a drug related rash from [place] (taking for UTI). She signed out AMA to get back to [facility] and then developed confusion. Rash was treated with prednisone. IVF fluids and med adjustment for acute renal failure. Out-patient cardiology referred to evaluate for possible arrhythmia. Head CT, echo, carotid duplex showed no obvious etiology. Had out-patient event monitor with only rare PVCs.  - PCP is Dr. Charleston Poot.  - Cardiologist is Dr. Lewayne Bunting with [facility] (see Care Everywhere). On 06/25/16, he signed a notes indicating she was low risk for nonvascular surgery. - Nephrologist is Dr. Willene Hatchet [phone]), who follows her for fluid overload, hyponatremia, hypokalemia.   - Urologist is Dr. Venia Minks [facility]).  Medications include bumetanide, doxepin, Cymbalta, Neurontin, Mag-Ox, metformin, Toprol XL, Percocet, Zofran, Protonix, potassium, spironolactone  BP 110/68   Pulse 73   Temp 36.8 C   Resp 20   Ht 5\' 1"  (1.549 m)   Wt 188 lb 12.8 oz (85.6 kg)   SpO2 98%   BMI 35.67 kg/m   05/12/16-06/10/16 Event Monitor [facility] Health; Care Everywhere): Rhythm Findings: 1.Ventricular: rare PVCs 2.Supraventricular: No None 3.Bradyarrhythmias and Pauses: No Symptoms reported:None CONCLUSIONS: 1.Normal Holter monitor.  2.Arrhythmia as  described 3.Symptoms were not reported  4.Symptoms were not correlated with arrhythmias  04/01/16 Echo [facility] Health; Care Everywhere): Summary Borderline left ventricular hypertrophy Normal left ventricular size and systolic function with no appreciable segmental abnormality. Trace mitral regurgitation Trace tricuspid regurgitation Normal estimated pulmonary artery pressure  04/01/16 Carotid U/S [facility] Health; Care Everywhere): Summary Minimal atherosclerotic plaque of the bilateral extracranial cerebrovascular circulation, with no significant stenosis by established duplex criteria.  04/01/16 EKG [facility]; Care Everywhere): RESULT NARRATIVE ONLY (tracing requested):  Normal sinus rhythm Possible Inferior infarct , age undetermined Abnormal ECG When compared with ECG of 19-Feb-2015 19:36, Vent. rate has decreased BY48 BPM Borderline criteria for Inferior infarct are now Present Nonspecific T wave abnormality now evident in Anterior leads Confirmed by Arline Asp [phone]) on 04/01/2016 12:37:46 PM  04/01/16 CXR [facility] Health; Care Everywhere): IMPRESSION: No active cardiopulmonary disease.Stable exam from priors.  Preoperative labs noted. K 3.0 (she is on a KCl supplement), Na 141, Cr 0.97. Glucose 128. CBC WNL. A1c 6.3.   If no acute changes then I anticipate that she can proceed as planned.  George Hugh [facility] Short Stay Center/Anesthesiology Phone [phone] 07/16/2016 12:36 PM

## 2016-07-19 MED ORDER — DEXAMETHASONE SODIUM PHOSPHATE 10 MG/ML IJ SOLN: 10.0000 mg | INTRAMUSCULAR | Status: DC

## 2016-07-19 MED ORDER — VANCOMYCIN HCL IN DEXTROSE 1-5 GM/200ML-% IV SOLN
1000.0000 mg | INTRAVENOUS | Status: AC
  Administered 2016-07-20: 1000 mg via INTRAVENOUS
  Filled 2016-07-19: qty 200

## 2016-07-20 ENCOUNTER — Inpatient Hospital Stay (HOSPITAL_COMMUNITY): Payer: Medicare Other

## 2016-07-20 ENCOUNTER — Encounter (HOSPITAL_COMMUNITY): Payer: Self-pay | Admitting: *Deleted

## 2016-07-20 ENCOUNTER — Inpatient Hospital Stay (HOSPITAL_COMMUNITY): Payer: Medicare Other | Admitting: Anesthesiology

## 2016-07-20 ENCOUNTER — Inpatient Hospital Stay (HOSPITAL_COMMUNITY)
Admission: RE | Admit: 2016-07-20 | Discharge: 2016-07-22 | DRG: 473 | Disposition: A | Discharge status: Home / Self Care | Payer: Medicare Other | Source: Ambulatory Visit | Attending: Neurosurgery | Admitting: Neurosurgery

## 2016-07-20 ENCOUNTER — Encounter (HOSPITAL_COMMUNITY)
Admission: RE | Disposition: A | Discharge status: Home / Self Care | Payer: Self-pay | Source: Ambulatory Visit | Attending: Neurosurgery

## 2016-07-20 ENCOUNTER — Inpatient Hospital Stay (HOSPITAL_COMMUNITY): Payer: Medicare Other | Admitting: Vascular Surgery

## 2016-07-20 DIAGNOSIS — G8929 Other chronic pain: Secondary | ICD-10-CM | POA: Diagnosis present

## 2016-07-20 DIAGNOSIS — Z886 Allergy status to analgesic agent status: Secondary | ICD-10-CM | POA: Diagnosis not present

## 2016-07-20 DIAGNOSIS — I10 Essential (primary) hypertension: Secondary | ICD-10-CM | POA: Diagnosis not present

## 2016-07-20 DIAGNOSIS — F419 Anxiety disorder, unspecified: Secondary | ICD-10-CM | POA: Diagnosis not present

## 2016-07-20 DIAGNOSIS — F329 Major depressive disorder, single episode, unspecified: Secondary | ICD-10-CM | POA: Diagnosis present

## 2016-07-20 DIAGNOSIS — Z79899 Other long term (current) drug therapy: Secondary | ICD-10-CM

## 2016-07-20 DIAGNOSIS — Z96653 Presence of artificial knee joint, bilateral: Secondary | ICD-10-CM | POA: Diagnosis not present

## 2016-07-20 DIAGNOSIS — G4730 Sleep apnea, unspecified: Secondary | ICD-10-CM | POA: Diagnosis not present

## 2016-07-20 DIAGNOSIS — Z91030 Bee allergy status: Secondary | ICD-10-CM | POA: Diagnosis not present

## 2016-07-20 DIAGNOSIS — M1990 Unspecified osteoarthritis, unspecified site: Secondary | ICD-10-CM | POA: Diagnosis present

## 2016-07-20 DIAGNOSIS — Z9841 Cataract extraction status, right eye: Secondary | ICD-10-CM

## 2016-07-20 DIAGNOSIS — Z880 Allergy status to penicillin: Secondary | ICD-10-CM

## 2016-07-20 DIAGNOSIS — Z7984 Long term (current) use of oral hypoglycemic drugs: Secondary | ICD-10-CM | POA: Diagnosis not present

## 2016-07-20 DIAGNOSIS — K219 Gastro-esophageal reflux disease without esophagitis: Secondary | ICD-10-CM | POA: Diagnosis present

## 2016-07-20 DIAGNOSIS — E119 Type 2 diabetes mellitus without complications: Secondary | ICD-10-CM | POA: Diagnosis present

## 2016-07-20 DIAGNOSIS — Z9884 Bariatric surgery status: Secondary | ICD-10-CM | POA: Diagnosis not present

## 2016-07-20 DIAGNOSIS — M542 Cervicalgia: Secondary | ICD-10-CM | POA: Diagnosis present

## 2016-07-20 DIAGNOSIS — Z9842 Cataract extraction status, left eye: Secondary | ICD-10-CM | POA: Diagnosis not present

## 2016-07-20 DIAGNOSIS — G43909 Migraine, unspecified, not intractable, without status migrainosus: Secondary | ICD-10-CM | POA: Diagnosis not present

## 2016-07-20 DIAGNOSIS — R402363 Coma scale, best motor response, obeys commands, at hospital admission: Secondary | ICD-10-CM | POA: Diagnosis present

## 2016-07-20 DIAGNOSIS — K5900 Constipation, unspecified: Secondary | ICD-10-CM | POA: Diagnosis present

## 2016-07-20 DIAGNOSIS — Z885 Allergy status to narcotic agent status: Secondary | ICD-10-CM | POA: Diagnosis not present

## 2016-07-20 DIAGNOSIS — K449 Diaphragmatic hernia without obstruction or gangrene: Secondary | ICD-10-CM | POA: Diagnosis present

## 2016-07-20 DIAGNOSIS — E785 Hyperlipidemia, unspecified: Secondary | ICD-10-CM | POA: Diagnosis not present

## 2016-07-20 DIAGNOSIS — M4722 Other spondylosis with radiculopathy, cervical region: Secondary | ICD-10-CM | POA: Diagnosis present

## 2016-07-20 DIAGNOSIS — Z419 Encounter for procedure for purposes other than remedying health state, unspecified: Secondary | ICD-10-CM

## 2016-07-20 DIAGNOSIS — Z90710 Acquired absence of both cervix and uterus: Secondary | ICD-10-CM

## 2016-07-20 DIAGNOSIS — M4802 Spinal stenosis, cervical region: Principal | ICD-10-CM | POA: Diagnosis present

## 2016-07-20 DIAGNOSIS — R402253 Coma scale, best verbal response, oriented, at hospital admission: Secondary | ICD-10-CM | POA: Diagnosis present

## 2016-07-20 DIAGNOSIS — Z9109 Other allergy status, other than to drugs and biological substances: Secondary | ICD-10-CM

## 2016-07-20 DIAGNOSIS — R402143 Coma scale, eyes open, spontaneous, at hospital admission: Secondary | ICD-10-CM | POA: Diagnosis present

## 2016-07-20 HISTORY — PX: ANTERIOR CERVICAL DECOMP/DISCECTOMY FUSION: SHX1161

## 2016-07-20 LAB — GLUCOSE, CAPILLARY
GLUCOSE-CAPILLARY: 102 mg/dL — AB (ref 65–99)
GLUCOSE-CAPILLARY: 117 mg/dL — AB (ref 65–99)
GLUCOSE-CAPILLARY: 217 mg/dL — AB (ref 65–99)
Glucose-Capillary: 144 mg/dL — ABNORMAL HIGH (ref 65–99)

## 2016-07-20 SURGERY — ANTERIOR CERVICAL DECOMPRESSION/DISCECTOMY FUSION 3 LEVELS
Anesthesia: General

## 2016-07-20 MED ORDER — DEXAMETHASONE SODIUM PHOSPHATE 10 MG/ML IJ SOLN
INTRAMUSCULAR | Status: DC | PRN
  Administered 2016-07-20: 10 mg via INTRAVENOUS

## 2016-07-20 MED ORDER — POLYETHYLENE GLYCOL 3350 17 G PO PACK
17.0000 g | PACK | Freq: Every day | ORAL | Status: DC
  Administered 2016-07-21: 17 g via ORAL
  Filled 2016-07-20: qty 1

## 2016-07-20 MED ORDER — FENTANYL CITRATE (PF) 100 MCG/2ML IJ SOLN
INTRAMUSCULAR | Status: AC
  Administered 2016-07-20: 50 ug via INTRAVENOUS
  Filled 2016-07-20: qty 2

## 2016-07-20 MED ORDER — LACTATED RINGERS IV SOLN
INTRAVENOUS | Status: DC
  Administered 2016-07-20: 09:00:00 via INTRAVENOUS

## 2016-07-20 MED ORDER — OXYCODONE-ACETAMINOPHEN 5-325 MG PO TABS
1.0000 | ORAL_TABLET | ORAL | Status: DC | PRN
  Administered 2016-07-20: 1 via ORAL
  Administered 2016-07-21: 2 via ORAL
  Administered 2016-07-21 (×2): 1 via ORAL
  Administered 2016-07-21 – 2016-07-22 (×2): 2 via ORAL
  Filled 2016-07-20 (×3): qty 1
  Filled 2016-07-20 (×3): qty 2

## 2016-07-20 MED ORDER — SODIUM CHLORIDE 0.9% FLUSH
3.0000 mL | Freq: Two times a day (BID) | INTRAVENOUS | Status: DC
  Administered 2016-07-21: 3 mL via INTRAVENOUS

## 2016-07-20 MED ORDER — DARIFENACIN HYDROBROMIDE ER 7.5 MG PO TB24
7.5000 mg | ORAL_TABLET | Freq: Every day | ORAL | Status: DC
  Administered 2016-07-21 – 2016-07-22 (×2): 7.5 mg via ORAL
  Filled 2016-07-20 (×3): qty 1

## 2016-07-20 MED ORDER — PHENOL 1.4 % MT LIQD: 1.0000 | OROMUCOSAL | Status: DC | PRN

## 2016-07-20 MED ORDER — INSULIN ASPART 100 UNIT/ML ~~LOC~~ SOLN
0.0000 [IU] | Freq: Three times a day (TID) | SUBCUTANEOUS | Status: DC
  Administered 2016-07-21: 3 [IU] via SUBCUTANEOUS

## 2016-07-20 MED ORDER — SODIUM CHLORIDE 0.9 % IR SOLN
Status: DC | PRN
  Administered 2016-07-20: 14:00:00

## 2016-07-20 MED ORDER — BUMETANIDE 0.5 MG PO TABS
0.5000 mg | ORAL_TABLET | Freq: Every day | ORAL | Status: DC
  Administered 2016-07-21: 0.5 mg via ORAL
  Filled 2016-07-20 (×2): qty 1

## 2016-07-20 MED ORDER — ROCURONIUM BROMIDE 100 MG/10ML IV SOLN
INTRAVENOUS | Status: DC | PRN
  Administered 2016-07-20: 50 mg via INTRAVENOUS

## 2016-07-20 MED ORDER — HYDROMORPHONE HCL 1 MG/ML IJ SOLN: 0.5000 mg | INTRAMUSCULAR | Status: DC | PRN

## 2016-07-20 MED ORDER — ONDANSETRON HCL 4 MG/2ML IJ SOLN: 4.0000 mg | INTRAMUSCULAR | Status: DC | PRN

## 2016-07-20 MED ORDER — SUGAMMADEX SODIUM 200 MG/2ML IV SOLN
INTRAVENOUS | Status: AC
  Filled 2016-07-20: qty 2

## 2016-07-20 MED ORDER — THROMBIN 20000 UNITS EX SOLR
CUTANEOUS | Status: DC | PRN
  Administered 2016-07-20: 14:00:00 via TOPICAL

## 2016-07-20 MED ORDER — ACETAMINOPHEN 325 MG PO TABS: 650.0000 mg | ORAL_TABLET | ORAL | Status: DC | PRN

## 2016-07-20 MED ORDER — ONDANSETRON HCL 4 MG/2ML IJ SOLN: 4.0000 mg | Freq: Once | INTRAMUSCULAR | Status: DC | PRN

## 2016-07-20 MED ORDER — CALCIUM CARBONATE-VITAMIN D 500-200 MG-UNIT PO TABS
1.0000 | ORAL_TABLET | Freq: Every day | ORAL | Status: DC
  Administered 2016-07-21 – 2016-07-22 (×2): 1 via ORAL
  Filled 2016-07-20 (×2): qty 1

## 2016-07-20 MED ORDER — SPIRONOLACTONE 50 MG PO TABS
50.0000 mg | ORAL_TABLET | Freq: Two times a day (BID) | ORAL | Status: DC
  Administered 2016-07-20 – 2016-07-22 (×4): 50 mg via ORAL
  Filled 2016-07-20 (×4): qty 1

## 2016-07-20 MED ORDER — ADULT MULTIVITAMIN W/MINERALS CH
1.0000 | ORAL_TABLET | Freq: Two times a day (BID) | ORAL | Status: DC
  Administered 2016-07-20 – 2016-07-22 (×4): 1 via ORAL
  Filled 2016-07-20 (×4): qty 1

## 2016-07-20 MED ORDER — ONDANSETRON HCL 4 MG/2ML IJ SOLN
INTRAMUSCULAR | Status: AC
  Filled 2016-07-20: qty 2

## 2016-07-20 MED ORDER — PANTOPRAZOLE SODIUM 40 MG PO TBEC
40.0000 mg | DELAYED_RELEASE_TABLET | Freq: Every day | ORAL | Status: DC
  Administered 2016-07-21 – 2016-07-22 (×2): 40 mg via ORAL
  Filled 2016-07-20 (×2): qty 1

## 2016-07-20 MED ORDER — VANCOMYCIN HCL IN DEXTROSE 750-5 MG/150ML-% IV SOLN
750.0000 mg | Freq: Two times a day (BID) | INTRAVENOUS | Status: AC
  Administered 2016-07-20: 750 mg via INTRAVENOUS
  Filled 2016-07-20 (×2): qty 150

## 2016-07-20 MED ORDER — MENTHOL 3 MG MT LOZG
1.0000 | LOZENGE | OROMUCOSAL | Status: DC | PRN
  Filled 2016-07-20: qty 9

## 2016-07-20 MED ORDER — LIDOCAINE 2% (20 MG/ML) 5 ML SYRINGE
INTRAMUSCULAR | Status: AC
  Filled 2016-07-20: qty 5

## 2016-07-20 MED ORDER — ONDANSETRON HCL 4 MG/2ML IJ SOLN
INTRAMUSCULAR | Status: DC | PRN
  Administered 2016-07-20: 4 mg via INTRAVENOUS

## 2016-07-20 MED ORDER — PROPOFOL 10 MG/ML IV BOLUS
INTRAVENOUS | Status: AC
  Filled 2016-07-20: qty 20

## 2016-07-20 MED ORDER — BUMETANIDE 0.5 MG PO TABS
1.0000 mg | ORAL_TABLET | Freq: Every day | ORAL | Status: DC
  Administered 2016-07-21 – 2016-07-22 (×2): 1 mg via ORAL
  Filled 2016-07-20 (×2): qty 2

## 2016-07-20 MED ORDER — 0.9 % SODIUM CHLORIDE (POUR BTL) OPTIME
TOPICAL | Status: DC | PRN
  Administered 2016-07-20: 1000 mL

## 2016-07-20 MED ORDER — PROPOFOL 10 MG/ML IV BOLUS
INTRAVENOUS | Status: DC | PRN
  Administered 2016-07-20: 20 mg via INTRAVENOUS
  Administered 2016-07-20: 150 mg via INTRAVENOUS

## 2016-07-20 MED ORDER — ONDANSETRON HCL 4 MG PO TABS: 8.0000 mg | ORAL_TABLET | Freq: Three times a day (TID) | ORAL | Status: DC | PRN

## 2016-07-20 MED ORDER — DOXEPIN HCL 25 MG PO CAPS
25.0000 mg | ORAL_CAPSULE | Freq: Three times a day (TID) | ORAL | Status: DC | PRN
  Filled 2016-07-20: qty 1

## 2016-07-20 MED ORDER — MIDAZOLAM HCL 2 MG/2ML IJ SOLN
INTRAMUSCULAR | Status: AC
  Filled 2016-07-20: qty 2

## 2016-07-20 MED ORDER — METFORMIN HCL 500 MG PO TABS
500.0000 mg | ORAL_TABLET | Freq: Every day | ORAL | Status: DC
  Administered 2016-07-21: 500 mg via ORAL
  Filled 2016-07-20: qty 1

## 2016-07-20 MED ORDER — FENTANYL CITRATE (PF) 100 MCG/2ML IJ SOLN
INTRAMUSCULAR | Status: AC
  Filled 2016-07-20: qty 4

## 2016-07-20 MED ORDER — MIDAZOLAM HCL 5 MG/5ML IJ SOLN
INTRAMUSCULAR | Status: DC | PRN
  Administered 2016-07-20: 1 mg via INTRAVENOUS

## 2016-07-20 MED ORDER — CYCLOBENZAPRINE HCL 10 MG PO TABS
10.0000 mg | ORAL_TABLET | Freq: Three times a day (TID) | ORAL | Status: DC | PRN
  Administered 2016-07-20 – 2016-07-21 (×2): 10 mg via ORAL
  Filled 2016-07-20 (×2): qty 1

## 2016-07-20 MED ORDER — MAGNESIUM OXIDE 400 (241.3 MG) MG PO TABS
400.0000 mg | ORAL_TABLET | Freq: Every day | ORAL | Status: DC
  Administered 2016-07-21 – 2016-07-22 (×2): 400 mg via ORAL
  Filled 2016-07-20 (×3): qty 1

## 2016-07-20 MED ORDER — ROCURONIUM BROMIDE 10 MG/ML (PF) SYRINGE
PREFILLED_SYRINGE | INTRAVENOUS | Status: AC
  Filled 2016-07-20: qty 10

## 2016-07-20 MED ORDER — SUGAMMADEX SODIUM 200 MG/2ML IV SOLN
INTRAVENOUS | Status: DC | PRN
  Administered 2016-07-20: 200 mg via INTRAVENOUS

## 2016-07-20 MED ORDER — CEFAZOLIN IN D5W 1 GM/50ML IV SOLN: 1.0000 g | Freq: Three times a day (TID) | INTRAVENOUS | Status: DC

## 2016-07-20 MED ORDER — FENTANYL CITRATE (PF) 100 MCG/2ML IJ SOLN
INTRAMUSCULAR | Status: DC | PRN
  Administered 2016-07-20: 50 ug via INTRAVENOUS
  Administered 2016-07-20: 150 ug via INTRAVENOUS
  Administered 2016-07-20 (×2): 50 ug via INTRAVENOUS

## 2016-07-20 MED ORDER — ESTROGENS, CONJUGATED 0.625 MG/GM VA CREA: 1.0000 | TOPICAL_CREAM | VAGINAL | Status: DC

## 2016-07-20 MED ORDER — METOPROLOL SUCCINATE ER 25 MG PO TB24
50.0000 mg | ORAL_TABLET | Freq: Two times a day (BID) | ORAL | Status: DC
  Administered 2016-07-20 – 2016-07-22 (×3): 50 mg via ORAL
  Filled 2016-07-20 (×4): qty 2

## 2016-07-20 MED ORDER — ACETAMINOPHEN 650 MG RE SUPP: 650.0000 mg | RECTAL | Status: DC | PRN

## 2016-07-20 MED ORDER — INSULIN ASPART 100 UNIT/ML ~~LOC~~ SOLN
0.0000 [IU] | Freq: Every day | SUBCUTANEOUS | Status: DC
  Administered 2016-07-20: 2 [IU] via SUBCUTANEOUS

## 2016-07-20 MED ORDER — OXYCODONE-ACETAMINOPHEN 5-325 MG PO TABS: 1.0000 | ORAL_TABLET | Freq: Four times a day (QID) | ORAL | Status: DC | PRN

## 2016-07-20 MED ORDER — SODIUM CHLORIDE 0.9% FLUSH: 3.0000 mL | INTRAVENOUS | Status: DC | PRN

## 2016-07-20 MED ORDER — LIDOCAINE HCL (CARDIAC) 20 MG/ML IV SOLN
INTRAVENOUS | Status: DC | PRN
  Administered 2016-07-20: 100 mg via INTRAVENOUS

## 2016-07-20 MED ORDER — LOPERAMIDE HCL 2 MG PO CAPS
2.0000 mg | ORAL_CAPSULE | Freq: Every day | ORAL | Status: DC | PRN
  Filled 2016-07-20: qty 1

## 2016-07-20 MED ORDER — FENTANYL CITRATE (PF) 100 MCG/2ML IJ SOLN
25.0000 ug | INTRAMUSCULAR | Status: DC | PRN
  Administered 2016-07-20 (×2): 50 ug via INTRAVENOUS

## 2016-07-20 MED ORDER — CALCIUM CARBONATE ANTACID 500 MG PO CHEW
1.0000 | CHEWABLE_TABLET | Freq: Every day | ORAL | Status: DC | PRN
  Filled 2016-07-20: qty 1

## 2016-07-20 MED ORDER — GABAPENTIN 600 MG PO TABS
600.0000 mg | ORAL_TABLET | Freq: Three times a day (TID) | ORAL | Status: DC
  Administered 2016-07-20 – 2016-07-22 (×5): 600 mg via ORAL
  Filled 2016-07-20 (×5): qty 1

## 2016-07-20 MED ORDER — DULOXETINE HCL 30 MG PO CPEP
60.0000 mg | ORAL_CAPSULE | Freq: Two times a day (BID) | ORAL | Status: DC
  Administered 2016-07-20 – 2016-07-22 (×4): 60 mg via ORAL
  Filled 2016-07-20 (×4): qty 2

## 2016-07-20 MED ORDER — POTASSIUM CHLORIDE CRYS ER 10 MEQ PO TBCR
10.0000 meq | EXTENDED_RELEASE_TABLET | Freq: Once | ORAL | Status: AC
  Administered 2016-07-21: 10 meq via ORAL
  Filled 2016-07-20: qty 1

## 2016-07-20 MED ORDER — PHENYLEPHRINE HCL 10 MG/ML IJ SOLN
INTRAMUSCULAR | Status: DC | PRN
  Administered 2016-07-20: 50 ug/min via INTRAVENOUS

## 2016-07-20 MED ORDER — FENTANYL CITRATE (PF) 100 MCG/2ML IJ SOLN
INTRAMUSCULAR | Status: AC
  Filled 2016-07-20: qty 2

## 2016-07-20 MED ORDER — THROMBIN 5000 UNITS EX SOLR
OROMUCOSAL | Status: DC | PRN
  Administered 2016-07-20: 14:00:00 via TOPICAL

## 2016-07-20 SURGICAL SUPPLY — 58 items
BAG DECANTER FOR FLEXI CONT (MISCELLANEOUS) ×3 IMPLANT
BENZOIN TINCTURE PRP APPL 2/3 (GAUZE/BANDAGES/DRESSINGS) ×3 IMPLANT
BIT DRILL 13 (BIT) ×2 IMPLANT
BIT DRILL 13MM (BIT) ×1
BRUSH SCRUB EZ PLAIN DRY (MISCELLANEOUS) ×3 IMPLANT
BUR MATCHSTICK NEURO 3.0 LAGG (BURR) ×3 IMPLANT
CANISTER SUCT 3000ML PPV (MISCELLANEOUS) ×3 IMPLANT
CLOSURE WOUND 1/2 X4 (GAUZE/BANDAGES/DRESSINGS) ×1
DECANTER SPIKE VIAL GLASS SM (MISCELLANEOUS) ×3 IMPLANT
DRAIN SNY WOU 7FLT (WOUND CARE) ×3 IMPLANT
DRAPE C-ARM 42X72 X-RAY (DRAPES) ×6 IMPLANT
DRAPE LAPAROTOMY 100X72 PEDS (DRAPES) ×3 IMPLANT
DRAPE MICROSCOPE LEICA (MISCELLANEOUS) ×3 IMPLANT
DRAPE POUCH INSTRU U-SHP 10X18 (DRAPES) ×3 IMPLANT
DRSG OPSITE POSTOP 4X6 (GAUZE/BANDAGES/DRESSINGS) ×3 IMPLANT
DURAPREP 6ML APPLICATOR 50/CS (WOUND CARE) ×3 IMPLANT
ELECT COATED BLADE 2.86 ST (ELECTRODE) ×3 IMPLANT
ELECT REM PT RETURN 9FT ADLT (ELECTROSURGICAL) ×3
ELECTRODE REM PT RTRN 9FT ADLT (ELECTROSURGICAL) ×1 IMPLANT
EVACUATOR SILICONE 100CC (DRAIN) ×3 IMPLANT
GAUZE SPONGE 4X4 12PLY STRL (GAUZE/BANDAGES/DRESSINGS) ×3 IMPLANT
GAUZE SPONGE 4X4 16PLY XRAY LF (GAUZE/BANDAGES/DRESSINGS) IMPLANT
GLOVE BIO SURGEON STRL SZ8 (GLOVE) ×3 IMPLANT
GLOVE EXAM NITRILE LRG STRL (GLOVE) IMPLANT
GLOVE EXAM NITRILE MD LF STRL (GLOVE) IMPLANT
GLOVE EXAM NITRILE XL STR (GLOVE) IMPLANT
GLOVE EXAM NITRILE XS STR PU (GLOVE) IMPLANT
GLOVE INDICATOR 8.5 STRL (GLOVE) ×3 IMPLANT
GOWN STRL REUS W/ TWL LRG LVL3 (GOWN DISPOSABLE) IMPLANT
GOWN STRL REUS W/ TWL XL LVL3 (GOWN DISPOSABLE) ×1 IMPLANT
GOWN STRL REUS W/TWL 2XL LVL3 (GOWN DISPOSABLE) IMPLANT
GOWN STRL REUS W/TWL LRG LVL3 (GOWN DISPOSABLE)
GOWN STRL REUS W/TWL XL LVL3 (GOWN DISPOSABLE) ×2
HALTER HD/CHIN CERV TRACTION D (MISCELLANEOUS) ×3 IMPLANT
HEMOSTAT POWDER KIT SURGIFOAM (HEMOSTASIS) ×3 IMPLANT
KIT BASIN OR (CUSTOM PROCEDURE TRAY) ×3 IMPLANT
KIT ROOM TURNOVER OR (KITS) ×3 IMPLANT
LIQUID BAND (GAUZE/BANDAGES/DRESSINGS) ×3 IMPLANT
NEEDLE SPNL 20GX3.5 QUINCKE YW (NEEDLE) ×3 IMPLANT
NS IRRIG 1000ML POUR BTL (IV SOLUTION) ×3 IMPLANT
PACK LAMINECTOMY NEURO (CUSTOM PROCEDURE TRAY) ×3 IMPLANT
PATTIES SURGICAL 1X1 (DISPOSABLE) ×3 IMPLANT
PLATE 3 55XLCK NS SPNE CVD (Plate) ×1 IMPLANT
PLATE 3 ATLANTIS TRANS (Plate) ×2 IMPLANT
RUBBERBAND STERILE (MISCELLANEOUS) ×6 IMPLANT
SCREW ST FIX 4 ATL 3120213 (Screw) ×24 IMPLANT
SPACER CERV FRGE 12X14X6-0 (Spacer) ×6 IMPLANT
SPACER CERVICAL FRGE 12X14X8-0 (Spacer) ×3 IMPLANT
SPONGE INTESTINAL PEANUT (DISPOSABLE) ×3 IMPLANT
SPONGE SURGIFOAM ABS GEL 100 (HEMOSTASIS) ×3 IMPLANT
STRIP CLOSURE SKIN 1/2X4 (GAUZE/BANDAGES/DRESSINGS) ×2 IMPLANT
SUT VIC AB 3-0 SH 8-18 (SUTURE) ×3 IMPLANT
SUT VICRYL 4-0 PS2 18IN ABS (SUTURE) ×3 IMPLANT
TAPE CLOTH 4X10 WHT NS (GAUZE/BANDAGES/DRESSINGS) ×3 IMPLANT
TOWEL OR 17X24 6PK STRL BLUE (TOWEL DISPOSABLE) ×3 IMPLANT
TOWEL OR 17X26 10 PK STRL BLUE (TOWEL DISPOSABLE) ×3 IMPLANT
TRAP SPECIMEN MUCOUS 40CC (MISCELLANEOUS) ×3 IMPLANT
WATER STERILE IRR 1000ML POUR (IV SOLUTION) ×3 IMPLANT

## 2016-07-20 NOTE — Anesthesia Preprocedure Evaluation (Signed)
Anesthesia Evaluation  Patient identified by MRN, date of birth, ID band Patient awake    Reviewed: Allergy & Precautions, NPO status , Patient's Chart, lab work & pertinent test results, reviewed documented beta blocker date and time   Airway Mallampati: III  TM Distance: <3 FB Neck ROM: Full    Dental  (+) Teeth Intact, Dental Advisory Given   Pulmonary sleep apnea ,    Pulmonary exam normal breath sounds clear to auscultation       Cardiovascular hypertension, Pt. on medications and Pt. on home beta blockers (-) angina(-) Past MI Normal cardiovascular exam+ dysrhythmias Supra Ventricular Tachycardia  Rhythm:Regular Rate:Normal     Neuro/Psych  Headaches, Seizures -, Well Controlled,  PSYCHIATRIC DISORDERS Anxiety Depression  Neuromuscular disease    GI/Hepatic Neg liver ROS, hiatal hernia, GERD  Medicated,  Endo/Other  diabetes, Type 2, Oral Hypoglycemic AgentsObesity   Renal/GU negative Renal ROS Bladder dysfunction      Musculoskeletal  (+) Arthritis , Osteoarthritis,    Abdominal   Peds  Hematology negative hematology ROS (+)   Anesthesia Other Findings Day of surgery medications reviewed with the patient.  Reproductive/Obstetrics                             Anesthesia Physical Anesthesia Plan  ASA: III  Anesthesia Plan: General   Post-op Pain Management:    Induction: Intravenous  Airway Management Planned: Oral ETT  Additional Equipment:   Intra-op Plan:   Post-operative Plan: Extubation in OR  Informed Consent: I have reviewed the patients History and Physical, chart, labs and discussed the procedure including the risks, benefits and alternatives for the proposed anesthesia with the patient or authorized representative who has indicated his/her understanding and acceptance.   Dental advisory given  Plan Discussed with: CRNA  Anesthesia Plan Comments:  (Risks/benefits of general anesthesia discussed with patient including risk of damage to teeth, lips, gum, and tongue, nausea/vomiting, allergic reactions to medications, and the possibility of heart attack, stroke and death.  All patient questions answered.  Patient wishes to proceed.)        Anesthesia Quick Evaluation

## 2016-07-20 NOTE — Op Note (Signed)
Preoperative diagnosis: Cervical spondylitic radiculopathy with cervical stenosis at C4-5, C5-6, C6-7  Postoperative diagnosis: Same  Procedure: Anterior cervical discectomies and fusion at C4-5, C5-6, C6-7 utilizing allograft wedges in the last translational plating system with 8 fixed angle 13 mm screws  Surgeon: Dominica Severin Shamecca Whitebread  Assistant: Marland Kitchen ditty  Anesthesia: Gen.  EBL: Minimal  History of present illness: Patient is very pleasant 65 year old female is a long-standing neck pain left greater than right arm pain rating to her shoulder and down into her hand consistent with C6 and C7 as well as some component of C5. Workup revealed severe spondylosis and stenosis predominantly at C5-6 with instability at C4-5 and a disc bulge at C6-7. Due to her multilevel radicular symptoms imaging findings and failure conservative treatment clinical progression a recommended anterior cervical discectomies and fusion at those levels. I extensively went over the risks and benefits of the operation the patient as well as perioperative course expectations of outcome and alternatives of surgery and she understood and agreed to proceed forward.  Operative procedure: Patient brought into the or was induced on general anesthesia positioned supine the neck in slight extension in 5 pounds of halter traction the right side of her neck was prepped and draped in routine sterile fashion preoperative x-ray localize the appropriate level so a curvilinear incision was made just off midline to to the anterior border of the sternomastoid and the superficial layer of the platysmas dissected and divided longitudinally the avascular plane to sternomastoid and strap muscles were dissected away with Kitners. Interoperative x-ray confirmed identification of [id number] disc space level so annulotomy was made with a 50 minute scalpel marked the disc space then the longus close reflected laterally and self-retaining retractor was placed. All 3  disc spaces were incised anterior aspect of did not Leksell rongeur and a 3 mm Kerrison punch the C5-6 disc space was markedly collapsed all disc spaces were scraped with a BA curette and then under Mike's cup illumination first working at C6-7 this disc space is drilled down the posterior annulus and posterior complex. The undersurface of the annulus was removed in piecemeal fashion the PLL was identified and was removed in piecemeal fashion with a large spur and disc just left of midline causing severe Displacement of the thecal sac this was aggressively under biting as well as all endplates. Marking laterally both C7 pedicles were identified both C7 nerve roots were decompressed and skeletonized flush with pedicle. Then a 8 mm allograft wedge was selected and inserted at this level. Then testing the C5-6 and similar fashion C5-6 was drilled down aggressively under biting both endplates removing the PLL large spur across the entire canal, coming off the C5 vertebral body was aggressively under bitten both C6 nerve roots were identified both C6 foramina were decompressed flush with pedicle. A 6 mm allograft wedges inserted at this level. At C4-5 and a similar fashion this disc space is drilled down posterior annulus was removed posterior longitudinal ligament was also removed in piecemeal fashion aggressively under biting of both endplates decompress the central canal and both C5 nerve roots and foramina were opened up decompressed flush with pedicle. Another 6 allograft was inserted here. A 55 mm Atlantis translational plate was selected all screws excellent purchase locking mechanism was engaged. Postop fluoroscopy confirmed good position of all the implants been meticulous in a stasis was maintained was copiously irrigated a drain was placed and the wounds closed in layers with after Vicryl and a running 4 subcuticular in the  skin. At the end of case all needle counts sponge counts were correct.

## 2016-07-20 NOTE — Anesthesia Procedure Notes (Signed)
Procedure Name: Intubation Date/Time: 07/20/2016 12:10 PM Performed by: Rush Farmer E Pre-anesthesia Checklist: Patient identified, Emergency Drugs available, Suction available, Patient being monitored and Timeout performed Patient Re-evaluated:Patient Re-evaluated prior to inductionOxygen Delivery Method: Circle system utilized Preoxygenation: Pre-oxygenation with 100% oxygen Intubation Type: IV induction Ventilation: Mask ventilation without difficulty Laryngoscope Size: Mac and 3 Grade View: Grade II Tube type: Oral Tube size: 7.0 mm Number of attempts: 1 Airway Equipment and Method: Stylet and LTA kit utilized Placement Confirmation: ETT inserted through vocal cords under direct vision,  positive ETCO2 and breath sounds checked- equal and bilateral Secured at: 21 cm Tube secured with: Tape Dental Injury: Teeth and Oropharynx as per pre-operative assessment

## 2016-07-20 NOTE — H&P (Signed)
[name] [name] is an 65 y.o. female.   Chief Complaint: Neck pain left arm pain HPI: Patient is 65 year female along Singh neck pain and cervical radiculopathy radiating to her left arm consistent with an C5-C6 and C7 nerve root pattern. Workup revealed severe cervical spondylosis and stenosis cord compression at C4-5 C5-6 and C6-7. Due to patient's failure conservative treatment imaging findings and progressive clinical syndrome I recommended decompressive anterior cervical discectomy and fusion at those levels. I extensively reviewed the risks and benefits of the operation the patient as well as perioperative course expectations of outcome and alternatives of surgery she understood and agreed to proceed forward.  Past Medical History:  Diagnosis Date  . Abnormal kidney function    with extremity swelling  . Anxiety   . Arthritis   . Cancer ([place])    skin cancer  . Chronic back pain   . Chronic pain   . Constipation    takes Miralax daily  . Depression    takes Cymbalta daily  . Diabetes mellitus without complication ([place])    takes Metformin daily  . GERD (gastroesophageal reflux disease)    takes Protonix daily  . H/O hiatal hernia   . Headache    last migraine 2 months ago  . History of bronchitis    winter 2016  . History of colon polyps    benign  . History of gout   . History of staph infection    many,many yrs ago  . Hyperlipidemia   . Hypertension    takes Metoprolol daily  . Joint pain   . Low iron    after a miscarriage  . Neuropathy ([facility])    takes Gabapentin daily  . Overactive bladder    takes Vesicare daily  . Pancreatitis   . Peripheral neuropathy ([place])   . Seizures ([place])    last one about 2 yrs ago  . Sleep apnea    does not use Cpap - couldn't keep mask on.  . Slow urinary stream    sees urologist and every 3 months per pt they "ream" her out  . SVT (supraventricular tachycardia) ([place])    on pt's medical record from PCP - pt doesn't remember     Past Surgical History:  Procedure Laterality Date  . ABDOMINAL HYSTERECTOMY    . BACK SURGERY     x 2   . BREATH TEK H PYLORI N/A 06/18/2014   Procedure: BREATH TEK H PYLORI;  Surgeon: Pedro Earls, MD;  Location: [facility];  Service: General;  Laterality: N/A;  . CESAREAN SECTION     x 3  . COLONOSCOPY    . ESOPHAGOGASTRODUODENOSCOPY    . EYE SURGERY Bilateral    cataract surgery  . GASTRIC BYPASS     minigastric bypass 2006 in [facility]  . HERNIA REPAIR    . JOINT REPLACEMENT     bil knee  . LAPAROSCOPIC BILATERAL SALPINGO OOPHERECTOMY    . TONSILLECTOMY      Family History  Problem Relation Age of Onset  . COPD Mother   . Cancer Mother   . Heart failure Father   . Hypertension Sister   . Hyperlipidemia Sister   . Obesity Sister    Social History:  reports that she has never smoked. She has never used smokeless tobacco. She reports that she drinks alcohol. She reports that she does not use drugs.  Allergies:  Allergies  Allergen Reactions  . Bee Venom Anaphylaxis  .  Oxaprozin Other (See Comments)    insomnia  . Erythrocin [Erythromycin] Other (See Comments)    Unknown reaction  . Penicillins Other (See Comments)    Unknown reaction as a child. Has patient had a PCN reaction causing immediate rash, facial/tongue/throat swelling, SOB or lightheadedness with hypotension: unknown Has patient had a PCN reaction causing severe rash involving mucus membranes or skin necrosis: unknown Has patient had a PCN reaction that required hospitalization: Yes Has patient had a PCN reaction occurring within the last 10 years: Yes If all of the above answers are "NO", then may proceed with Cephalosporin use.   . Clindamycin/Lincomycin Rash  . Fioricet [Butalbital-Apap-Caffeine] Other (See Comments)    dizziness  . Hydrocodone-Acetaminophen Rash  . Morphine And Related Rash  . Phenobarbital Rash  . Teflaro [Ceftaroline] Rash  . Tramadol Other (See Comments)     insomnia  . Vancomycin Itching and Rash    Medications Prior to Admission  Medication Sig Dispense Refill  . bumetanide (BUMEX) 1 MG tablet Take 0.5-1 mg by mouth 2 (two) times daily. 1 tablet in the AM, 0.5 tablet in the PM.    . calcium carbonate (TUMS - DOSED IN MG ELEMENTAL CALCIUM) 500 MG chewable tablet Chew 1 tablet by mouth daily as needed for indigestion or heartburn.     . calcium-vitamin D (OSCAL WITH D) 500-200 MG-UNIT tablet Take 1 tablet by mouth daily with breakfast.    . conjugated estrogens (PREMARIN) vaginal cream Place 1 Applicatorful vaginally as directed. Twice a week.    . doxepin (SINEQUAN) 25 MG capsule Take 25 mg by mouth 3 (three) times daily as needed (itching).     . DULoxetine (CYMBALTA) 60 MG capsule Take 60 mg by mouth 2 (two) times daily.    Marland Kitchen gabapentin (NEURONTIN) 300 MG capsule Take 600 mg by mouth 3 (three) times daily.    . magnesium oxide (MAG-OX) 400 MG tablet Take 400 mg by mouth daily.    . metFORMIN (GLUCOPHAGE) 500 MG tablet Take 500 mg by mouth at bedtime.     . metoprolol succinate (TOPROL-XL) 50 MG 24 hr tablet Take 50 mg by mouth 2 (two) times daily.  0  . metroNIDAZOLE (METROGEL) 1 % gel Apply 1 application topically 2 (two) times daily.    . Multiple Vitamin (MULTIVITAMIN WITH MINERALS) TABS Take 1 tablet by mouth 2 (two) times daily.    Marland Kitchen oxyCODONE-acetaminophen (PERCOCET/ROXICET) 5-325 MG per tablet Take 1 tablet by mouth every 6 (six) hours as needed for pain.    . polyethylene glycol (MIRALAX / GLYCOLAX) packet Take 17 g by mouth daily.    . potassium chloride (MICRO-K) 10 MEQ CR capsule Take 10 mEq by mouth 2 (two) times daily.    . solifenacin (VESICARE) 5 MG tablet Take 5 mg by mouth daily.    Marland Kitchen spironolactone (ALDACTONE) 50 MG tablet Take 50 mg by mouth 2 (two) times daily.     Marland Kitchen triamcinolone cream (KENALOG) 0.1 % Apply 1 application topically 2 (two) times daily as needed (affected area).    . loperamide (IMODIUM) 2 MG capsule Take 2  mg by mouth daily as needed for diarrhea or loose stools.    . ondansetron (ZOFRAN) 8 MG tablet Take 8 mg by mouth every 8 (eight) hours as needed for nausea or vomiting.    . pantoprazole (PROTONIX) 40 MG tablet Take 40 mg by mouth daily.      Results for orders placed or performed during the hospital  encounter of 07/20/16 (from the past 48 hour(s))  Glucose, capillary     Status: Abnormal   Collection Time: 07/20/16  9:23 AM  Result Value Ref Range   Glucose-Capillary 102 (H) 65 - 99 mg/dL   No results found.  Review of Systems  Constitutional: Negative.   HENT: Negative.   Eyes: Negative.   Respiratory: Negative.   Cardiovascular: Negative.   Gastrointestinal: Negative.   Genitourinary: Negative.   Musculoskeletal: Positive for myalgias and neck pain.  Skin: Negative.   Neurological: Positive for tingling and sensory change.  Psychiatric/Behavioral: Negative.     Blood pressure (!) 153/57, pulse 63, temperature 98.3 F (36.8 C), temperature source Oral, resp. rate 20, height 5\' 1"  (1.549 m), weight 85.6 kg (188 lb 12.8 oz), SpO2 99 %. Physical Exam  Constitutional: She is oriented to person, place, and time. She appears well-developed and well-nourished.  HENT:  Head: Normocephalic.  Eyes: Pupils are equal, round, and reactive to light.  Neck: Normal range of motion.  Respiratory: Effort normal.  GI: Soft.  Neurological: She is alert and oriented to person, place, and time. She has normal strength. GCS eye subscore is 4. GCS verbal subscore is 5. GCS motor subscore is 6.  Strength 5 out of 5 deltoid, bicep, tricep, wrist flexion, wrist extension, hand intrinsics.     Assessment/Plan 65 year old female presents for anterior cervical discectomies and fusion from C4-C7.  Anjulie Dipierro P, MD 07/20/2016, 11:44 AM

## 2016-07-20 NOTE — Transfer of Care (Signed)
Immediate Anesthesia Transfer of Care Note  Patient: MACLE CRAM  Procedure(s) Performed: Procedure(s): Anterior Cervical Discectomy Fusion - Cervical four-Cervical five- Cervical five -Cervical six - Cervical six- Cervical seven (N/A)  Patient Location: PACU  Anesthesia Type:General  Level of Consciousness: awake, alert  and oriented  Airway & Oxygen Therapy: Patient Spontanous Breathing and Patient connected to face mask oxygen  Post-op Assessment: Report given to RN, Post -op Vital signs reviewed and stable and Patient moving all extremities X 4  Post vital signs: Reviewed and stable  Last Vitals:  Vitals:   07/20/16 0919 07/20/16 1500  BP: (!) 153/57   Pulse: 63   Resp: 20   Temp: 36.8 C 37 C    Last Pain:  Vitals:   07/20/16 1500  TempSrc:   PainSc: 0-No pain      Patients Stated Pain Goal: 4 ([id number])  Complications: No apparent anesthesia complications

## 2016-07-20 NOTE — Progress Notes (Signed)
Orthopedic Tech Progress Note Patient Details:  [name] [name] 1951-11-29 [id number]  Patient ID: [name] [name], female   DOB: 1951/11/21, 65 y.o.   MRN: [id number] Pt already has a soft collar.  [name] 07/20/2016, 9:35 PM

## 2016-07-21 LAB — GLUCOSE, CAPILLARY
Glucose-Capillary: 107 mg/dL — ABNORMAL HIGH (ref 65–99)
Glucose-Capillary: 81 mg/dL (ref 65–99)
Glucose-Capillary: 129 mg/dL — ABNORMAL HIGH (ref 65–99)
Glucose-Capillary: 157 mg/dL — ABNORMAL HIGH (ref 65–99)

## 2016-07-21 NOTE — Progress Notes (Signed)
Patient ID: [name] [name], female   DOB: [birth year], 65 y.o.   MRN: [id number] Doing well no new numbness or tingling  Strength 5 out of 5 wound clean dry and intact  Continue to mobilize physical therapy we'll keep in the hospital another day.

## 2016-07-21 NOTE — Anesthesia Postprocedure Evaluation (Signed)
Anesthesia Post Note  Patient: ELMIRE STANBRO  Procedure(s) Performed: Procedure(s) (LRB): Anterior Cervical Discectomy Fusion - Cervical four-Cervical five- Cervical five -Cervical six - Cervical six- Cervical seven (N/A)  Patient location during evaluation: PACU Anesthesia Type: General Level of consciousness: awake and alert Pain management: pain level controlled Vital Signs Assessment: post-procedure vital signs reviewed and stable Respiratory status: spontaneous breathing, nonlabored ventilation, respiratory function stable and patient connected to nasal cannula oxygen Cardiovascular status: blood pressure returned to baseline and stable Postop Assessment: no signs of nausea or vomiting Anesthetic complications: no    Last Vitals:  Vitals:   07/20/16 2339 07/21/16 0400  BP: (!) 103/46 (!) 114/58  Pulse: 72 71  Resp: 18 20  Temp: 36.8 C 36.9 C    Last Pain:  Vitals:   07/21/16 0619  TempSrc:   PainSc: 4                  Catalina Gravel

## 2016-07-21 NOTE — Progress Notes (Signed)
PT Cancellation Note  Patient Details Name: [name] [name] MRN: [id number] DOB: Apr 18, 1951   Cancelled Treatment:    Reason Eval/Treat Not Completed: PT screened, no needs identified, will sign off (PER OT, no needs)   Duncan Dull 07/21/2016, 9:09 AM Alben Deeds, PT DPT  [phone]

## 2016-07-21 NOTE — Evaluation (Signed)
Occupational Therapy Evaluation Patient Details Name: [name] [name] MRN: [id number] DOB: [birth year] Today's Date: 07/21/2016    History of Present Illness  Anterior cervical discectomies and fusion at C4-5, C5-6, C6-7   Clinical Impression   This 65 yo female admitted and underwent above presents to acute OT with deficits below  (see OT problem list--?cognitive deficits v. Med related). She will benefit from one more session of OT to make sure she remembers what was gone over with her on eval.    Follow Up Recommendations  No OT follow up    Equipment Recommendations  None recommended by OT       Precautions / Restrictions Precautions Precautions: None Required Braces or Orthoses: Cervical Brace Cervical Brace: For comfort;Soft collar Restrictions Weight Bearing Restrictions: No      Mobility Bed Mobility Overal bed mobility: Modified Independent             General bed mobility comments: used rail since she has one at home  Transfers Overall transfer level: Modified independent Equipment used: None                       ADL Overall ADL's : Needs assistance/impaired Eating/Feeding: Independent;Sitting                                     General ADL Comments: Pt asking about getting items out of her refrigerator (explained to her that she needs to have her family help her get items she uses the most towards the front of refrigerator or in door of refrigerator); She asked about being able to feed and change out liter box for her cat (i recommended that her cats feeding station be moved up onto a higher surface she could easliy reach and do not recommend at all she emptying the liter box no matter where it is); She also asked about her laundry--she has a stackable one ( I recommended that she can put clothes in washer with her hands, get it out with reacher that she has, can get out of dryer with hands (since dryer is on top). Pt can get  to her feet to bath and dress by crossing her legs over each other. I educated her on when she brushes her teeth to use 2 cups (one to spit and one to rinse). She has a walk in shower with seat, handheld shower--this set up is great for her.               Pertinent Vitals/Pain Pain Assessment: 0-10 Pain Score: 5  Pain Location: neck and shoulders Pain Descriptors / Indicators: Aching;Tingling;Sore Pain Intervention(s): Limited activity within patient's tolerance;Monitored during session;Patient requesting pain meds-RN notified;Repositioned     Hand Dominance Right   Extremity/Trunk Assessment Upper Extremity Assessment Upper Extremity Assessment: Overall WFL for tasks assessed   Lower Extremity Assessment Lower Extremity Assessment: Overall WFL for tasks assessed       Communication Communication Communication: No difficulties   Cognition Arousal/Alertness: Awake/alert Behavior During Therapy: WFL for tasks assessed/performed Overall Cognitive Status:  (pt did think it was supper time not breakfast time)                                Home Living Family/patient expects to be discharged to:: Private residence Living Arrangements: Alone Available Help at Discharge: Family;Available  PRN/intermittently Type of Home: House Home Access: Ramped entrance     Home Layout: One level     Bathroom Shower/Tub: Walk-in Hydrologist: Standard     Home Equipment: Environmental consultant - 2 wheels;Cane - single point;Bedside commode;Adaptive equipment;Shower seat;Hand held shower head;Other (comment) (bed rail) Adaptive Equipment: Sock aid;Reacher Additional Comments: Has equipment from prior surgeries (back and B knees).      Prior Functioning/Environment Level of Independence: Independent             OT Diagnosis: Generalized weakness;Acute pain (questionable cognitive deficits v. med related)   OT Problem List: Decreased cognition   OT  Treatment/Interventions: Self-care/ADL training;Patient/family education    OT Goals(Current goals can be found in the care plan section) Acute Rehab OT Goals Patient Stated Goal: home tomorrow OT Goal Formulation: With patient Time For Goal Achievement: 07/28/16 Potential to Achieve Goals: Good  OT Frequency: Min 2X/week              End of Session Equipment Utilized During Treatment: Cervical collar Nurse Communication: Mobility status  Activity Tolerance: Patient tolerated treatment well Patient left: in chair;with call bell/phone within reach   Time: 0750-0813 OT Time Calculation (min): 23 min Charges:  OT General Charges $OT Visit: 1 Procedure OT Evaluation $OT Eval Moderate Complexity: 1 Procedure OT Treatments $Self Care/Home Management : 8-22 mins  Almon Register [id number] 07/21/2016, 10:00 AM

## 2016-07-22 MED ORDER — OXYCODONE-ACETAMINOPHEN 5-325 MG PO TABS: 1.0000 | ORAL_TABLET | Freq: Four times a day (QID) | ORAL | 0 refills | Status: DC | PRN

## 2016-07-22 NOTE — Progress Notes (Signed)
Subjective: Patient reports Doing well pain well controlled  Objective: Vital signs in last 24 hours: Temp:  [97.7 F (36.5 C)-98.2 F (36.8 C)] 98.2 F (36.8 C) (08/23 0727) Pulse Rate:  [61-72] 69 (08/23 0727) Resp:  [18] 18 (08/23 0727) BP: (98-122)/(56-82) 109/58 (08/23 0727) SpO2:  [95 %-100 %] 99 % (08/23 0727)  Intake/Output from previous day: 08/22 0701 - 08/23 0700 In: 960 [P.O.:960] Out: 351 [Urine:351] Intake/Output this shift: No intake/output data recorded.  Strength 5 out of 5 wound clean dry and intact  Lab Results: No results for input(s): WBC, HGB, HCT, PLT in the last 72 hours. BMET No results for input(s): NA, K, CL, CO2, GLUCOSE, BUN, CREATININE, CALCIUM in the last 72 hours.  Studies/Results: Dg Cervical Spine 2-3 Views  Result Date: 07/20/2016 CLINICAL DATA:  Status post anterior fusion from C4-C7 EXAM: DG C-ARM 61-120 MIN; CERVICAL SPINE - 1 VIEW COMPARISON:  Cervical spine CT Apr 01, 2016 FLUOROSCOPY TIME:  0 minutes 19 seconds; 2 submitted images FINDINGS: Cross-table lateral intraoperative images show anterior screw and plate fixation from [id number]. The screw and plate fixation device appears intact on lateral view. No fracture or spondylolisthesis. IMPRESSION: Status post anterior fusion from C4-C7 with the support hardware appearing intact on lateral view. No fracture or spondylolisthesis evident on lateral projection. Electronically Signed   By: Lowella Grip III M.D.   On: 07/20/2016 15:07   Dg C-arm 1-60 Min  Result Date: 07/20/2016 CLINICAL DATA:  Status post anterior fusion from C4-C7 EXAM: DG C-ARM 61-120 MIN; CERVICAL SPINE - 1 VIEW COMPARISON:  Cervical spine CT Apr 01, 2016 FLUOROSCOPY TIME:  0 minutes 19 seconds; 2 submitted images FINDINGS: Cross-table lateral intraoperative images show anterior screw and plate fixation from [id number]. The screw and plate fixation device appears intact on lateral view. No fracture or spondylolisthesis. IMPRESSION:  Status post anterior fusion from C4-C7 with the support hardware appearing intact on lateral view. No fracture or spondylolisthesis evident on lateral projection. Electronically Signed   By: Lowella Grip III M.D.   On: 07/20/2016 15:07    Assessment/Plan: Discharge home  LOS: 2 days     [name] [name] P 07/22/2016, 8:20 AM

## 2016-07-22 NOTE — Progress Notes (Signed)
Patient alert and oriented, mae's well, voiding adequate amount of urine, swallowing without difficulty, no c/o pain. Patient discharged home with family. Script and discharged instructions given to patient. Patient and family stated understanding of d/c instructions given and has an appointment with MD. 

## 2016-07-22 NOTE — Discharge Instructions (Signed)

## 2016-07-22 NOTE — Progress Notes (Signed)
OT Cancellation Note  Patient Details Name: UTA BLYDENBURGH MRN: [id number] DOB: 04-Sep-1951   Cancelled Treatment:    Reason Eval/Treat Not Completed: Patient declined, no reason specified. Pt verbalized understanding of cervical precautions and all education provided during yesterday's OT session. Pt with no further acute OT needs. OT signing off.  Redmond Baseman, OTR/L Pager: [phone] 07/22/2016, 9:50 AM

## 2016-07-22 NOTE — Discharge Summary (Signed)
Physician Discharge Summary  Patient ID: [name] [name] MRN: [id number] DOB/AGE: 06-26-1951 65 y.o.  Admit date: 07/20/2016 Discharge date: 07/22/2016  Admission Diagnoses:Cervical spondylosis with radiculopathy C4-C7  Discharge Diagnoses: Same Active Problems:   Spinal stenosis of cervical region   Discharged Condition: good  Hospital Course: Patient is [facility] underwent anterior cervical discectomies and fusion at C4-5 5667 postop patient did very well recovered in the floor on the floor was angling and voiding spontaneously tolerating regular diet pain was difficult to control the first postoperative day she was kept an additional day and discharged stable on postoperative day 2. ACDF C4-5, C5-6, C6-7.  Consults: Significant Diagnostic Studies: TreatmACDF C4-5, C5-6, C6-7.ischarge Exam: Blood pressure (!) 109/58, pulse 69, temperature 98.2 F (36.8 C), resp. rate 18, height 5\' 1"  (1.549 m), weight 85.6 kg (188 lb 12.8 oz), SpO2 99 %.strength out of 5 wound clean dry and intact.   DisposHome    Medication List    TAKE these medications   bumetanide 1 MG tablet Commonly known as:  BUMEX Take 0.5-1 mg by mouth 2 (two) times daily. 1 tablet in the AM, 0.5 tablet in the PM.   calcium carbonate 500 MG chewable tablet Commonly known as:  TUMS - dosed in mg elemental calcium Chew 1 tablet by mouth daily as needed for indigestion or heartburn.   calcium-vitamin D 500-200 MG-UNIT tablet Commonly known as:  OSCAL WITH D Take 1 tablet by mouth daily with breakfast.   conjugated estrogens vaginal cream Commonly known as:  PREMARIN Place 1 Applicatorful vaginally as directed. Twice a week.   doxepin 25 MG capsule Commonly known as:  SINEQUAN Take 25 mg by mouth 3 (three) times daily as needed (itching).   DULoxetine 60 MG capsule Commonly known as:  CYMBALTA Take 60 mg by mouth 2 (two) times daily.   gabapentin 300 MG capsule Commonly known as:   NEURONTIN Take 600 mg by mouth 3 (three) times daily.   loperamide 2 MG capsule Commonly known as:  IMODIUM Take 2 mg by mouth daily as needed for diarrhea or loose stools.   magnesium oxide 400 MG tablet Commonly known as:  MAG-OX Take 400 mg by mouth daily.   metFORMIN 500 MG tablet Commonly known as:  GLUCOPHAGE Take 500 mg by mouth at bedtime.   metoprolol succinate 50 MG 24 hr tablet Commonly known as:  TOPROL-XL Take 50 mg by mouth 2 (two) times daily.   metroNIDAZOLE 1 % gel Commonly known as:  METROGEL Apply 1 application topically 2 (two) times daily.   multivitamin with minerals Tabs tablet Take 1 tablet by mouth 2 (two) times daily.   ondansetron 8 MG tablet Commonly known as:  ZOFRAN Take 8 mg by mouth every 8 (eight) hours as needed for nausea or vomiting.   oxyCODONE-acetaminophen 5-325 MG tablet Commonly known as:  PERCOCET/ROXICET Take 1 tablet by mouth every 6 (six) hours as needed for moderate pain. What changed:  You were already taking a medication with the same name, and this prescription was added. Make sure you understand how and when to take each.   oxyCODONE-acetaminophen 5-325 MG tablet Commonly known as:  PERCOCET/ROXICET Take 1 tablet by mouth every 6 (six) hours as needed for pain. What changed:  Another medication with the same name was added. Make sure you understand how and when to take each.   pantoprazole 40 MG tablet Commonly known as:  PROTONIX Take 40 mg by mouth daily.   polyethylene glycol  packet Commonly known as:  MIRALAX / GLYCOLAX Take 17 g by mouth daily.   potassium chloride 10 MEQ CR capsule Commonly known as:  MICRO-K Take 10 mEq by mouth 2 (two) times daily.   solifenacin 5 MG tablet Commonly known as:  VESICARE Take 5 mg by mouth daily.   spironolactone 50 MG tablet Commonly known as:  ALDACTONE Take 50 mg by mouth 2 (two) times daily.   triamcinolone cream 0.1 % Commonly known as:  KENALOG Apply 1  application topically 2 (two) times daily as needed (affected area).      Follow-up Information    Fermina Mishkin P, MD Follow up in 3 day(s).   Specialty:  Neurosurgery Contact information: [address] [place] [address] [phone]        Elaina Hoops, MD .   Specialty:  Neurosurgery Contact information: [address] [place] [address] [phone]           Signed: Elaina Hoops 07/22/2016, 8:22 AM

## 2016-07-23 ENCOUNTER — Encounter (HOSPITAL_COMMUNITY): Payer: Self-pay | Admitting: Neurosurgery

## 2016-07-27 ENCOUNTER — Emergency Department (HOSPITAL_COMMUNITY): Payer: Medicare Other

## 2016-07-27 ENCOUNTER — Inpatient Hospital Stay (HOSPITAL_COMMUNITY): Payer: Medicare Other

## 2016-07-27 ENCOUNTER — Inpatient Hospital Stay (HOSPITAL_COMMUNITY)
Admission: EM | Admit: 2016-07-27 | Discharge: 2016-07-28 | DRG: 690 | Disposition: A | Discharge status: Home Health | Payer: Medicare Other | Attending: Oncology | Admitting: Oncology

## 2016-07-27 ENCOUNTER — Encounter (HOSPITAL_COMMUNITY): Payer: Self-pay | Admitting: Emergency Medicine

## 2016-07-27 DIAGNOSIS — R32 Unspecified urinary incontinence: Secondary | ICD-10-CM | POA: Diagnosis present

## 2016-07-27 DIAGNOSIS — G8929 Other chronic pain: Secondary | ICD-10-CM | POA: Diagnosis present

## 2016-07-27 DIAGNOSIS — Z825 Family history of asthma and other chronic lower respiratory diseases: Secondary | ICD-10-CM

## 2016-07-27 DIAGNOSIS — Z6834 Body mass index (BMI) 34.0-34.9, adult: Secondary | ICD-10-CM | POA: Diagnosis not present

## 2016-07-27 DIAGNOSIS — R159 Full incontinence of feces: Secondary | ICD-10-CM | POA: Diagnosis present

## 2016-07-27 DIAGNOSIS — E669 Obesity, unspecified: Secondary | ICD-10-CM | POA: Diagnosis present

## 2016-07-27 DIAGNOSIS — E1142 Type 2 diabetes mellitus with diabetic polyneuropathy: Secondary | ICD-10-CM | POA: Diagnosis present

## 2016-07-27 DIAGNOSIS — F4323 Adjustment disorder with mixed anxiety and depressed mood: Secondary | ICD-10-CM | POA: Diagnosis present

## 2016-07-27 DIAGNOSIS — Z981 Arthrodesis status: Secondary | ICD-10-CM

## 2016-07-27 DIAGNOSIS — B9689 Other specified bacterial agents as the cause of diseases classified elsewhere: Secondary | ICD-10-CM | POA: Diagnosis not present

## 2016-07-27 DIAGNOSIS — M109 Gout, unspecified: Secondary | ICD-10-CM | POA: Diagnosis present

## 2016-07-27 DIAGNOSIS — Z96653 Presence of artificial knee joint, bilateral: Secondary | ICD-10-CM | POA: Diagnosis present

## 2016-07-27 DIAGNOSIS — T40605A Adverse effect of unspecified narcotics, initial encounter: Secondary | ICD-10-CM | POA: Diagnosis present

## 2016-07-27 DIAGNOSIS — Z881 Allergy status to other antibiotic agents status: Secondary | ICD-10-CM

## 2016-07-27 DIAGNOSIS — E1110 Type 2 diabetes mellitus with ketoacidosis without coma: Secondary | ICD-10-CM

## 2016-07-27 DIAGNOSIS — M25511 Pain in right shoulder: Secondary | ICD-10-CM | POA: Diagnosis present

## 2016-07-27 DIAGNOSIS — F419 Anxiety disorder, unspecified: Secondary | ICD-10-CM | POA: Diagnosis present

## 2016-07-27 DIAGNOSIS — M25512 Pain in left shoulder: Secondary | ICD-10-CM | POA: Diagnosis present

## 2016-07-27 DIAGNOSIS — Z87440 Personal history of urinary (tract) infections: Secondary | ICD-10-CM

## 2016-07-27 DIAGNOSIS — Z885 Allergy status to narcotic agent status: Secondary | ICD-10-CM

## 2016-07-27 DIAGNOSIS — Z85828 Personal history of other malignant neoplasm of skin: Secondary | ICD-10-CM | POA: Diagnosis not present

## 2016-07-27 DIAGNOSIS — Z8619 Personal history of other infectious and parasitic diseases: Secondary | ICD-10-CM

## 2016-07-27 DIAGNOSIS — K449 Diaphragmatic hernia without obstruction or gangrene: Secondary | ICD-10-CM | POA: Diagnosis present

## 2016-07-27 DIAGNOSIS — Z9884 Bariatric surgery status: Secondary | ICD-10-CM | POA: Diagnosis not present

## 2016-07-27 DIAGNOSIS — N390 Urinary tract infection, site not specified: Secondary | ICD-10-CM | POA: Diagnosis not present

## 2016-07-27 DIAGNOSIS — G9340 Encephalopathy, unspecified: Secondary | ICD-10-CM

## 2016-07-27 DIAGNOSIS — N3281 Overactive bladder: Secondary | ICD-10-CM | POA: Diagnosis present

## 2016-07-27 DIAGNOSIS — Z91030 Bee allergy status: Secondary | ICD-10-CM

## 2016-07-27 DIAGNOSIS — R4182 Altered mental status, unspecified: Secondary | ICD-10-CM | POA: Diagnosis present

## 2016-07-27 DIAGNOSIS — K219 Gastro-esophageal reflux disease without esophagitis: Secondary | ICD-10-CM | POA: Diagnosis present

## 2016-07-27 DIAGNOSIS — M549 Dorsalgia, unspecified: Secondary | ICD-10-CM | POA: Diagnosis present

## 2016-07-27 DIAGNOSIS — E785 Hyperlipidemia, unspecified: Secondary | ICD-10-CM | POA: Diagnosis present

## 2016-07-27 DIAGNOSIS — F329 Major depressive disorder, single episode, unspecified: Secondary | ICD-10-CM | POA: Diagnosis present

## 2016-07-27 DIAGNOSIS — Z8249 Family history of ischemic heart disease and other diseases of the circulatory system: Secondary | ICD-10-CM

## 2016-07-27 DIAGNOSIS — R000 Tachycardia, unspecified: Secondary | ICD-10-CM | POA: Diagnosis present

## 2016-07-27 DIAGNOSIS — R410 Disorientation, unspecified: Secondary | ICD-10-CM | POA: Diagnosis not present

## 2016-07-27 DIAGNOSIS — I10 Essential (primary) hypertension: Secondary | ICD-10-CM | POA: Diagnosis not present

## 2016-07-27 DIAGNOSIS — M47812 Spondylosis without myelopathy or radiculopathy, cervical region: Secondary | ICD-10-CM

## 2016-07-27 DIAGNOSIS — M47896 Other spondylosis, lumbar region: Secondary | ICD-10-CM

## 2016-07-27 DIAGNOSIS — Z888 Allergy status to other drugs, medicaments and biological substances status: Secondary | ICD-10-CM

## 2016-07-27 LAB — CBC WITH DIFFERENTIAL/PLATELET
BASOS ABS: 0 10*3/uL (ref 0.0–0.1)
Basophils Relative: 0 %
EOS PCT: 1 %
Eosinophils Absolute: 0.1 10*3/uL (ref 0.0–0.7)
HEMATOCRIT: 43.5 % (ref 36.0–46.0)
HEMOGLOBIN: 13.9 g/dL (ref 12.0–15.0)
LYMPHS ABS: 1.5 10*3/uL (ref 0.7–4.0)
LYMPHS PCT: 14 %
MCH: 27.3 pg (ref 26.0–34.0)
MCHC: 32 g/dL (ref 30.0–36.0)
MCV: 85.5 fL (ref 78.0–100.0)
Monocytes Absolute: 0.9 10*3/uL (ref 0.1–1.0)
Monocytes Relative: 8 %
NEUTROS ABS: 8.2 10*3/uL — AB (ref 1.7–7.7)
Neutrophils Relative %: 77 %
Platelets: 280 10*3/uL (ref 150–400)
RBC: 5.09 MIL/uL (ref 3.87–5.11)
RDW: 13.7 % (ref 11.5–15.5)
WBC: 10.7 10*3/uL — AB (ref 4.0–10.5)

## 2016-07-27 LAB — URINALYSIS, ROUTINE W REFLEX MICROSCOPIC
BILIRUBIN URINE: NEGATIVE
Glucose, UA: NEGATIVE mg/dL
Hgb urine dipstick: NEGATIVE
NITRITE: POSITIVE — AB
Protein, ur: NEGATIVE mg/dL
Specific Gravity, Urine: 1.023 (ref 1.005–1.030)
pH: 6 (ref 5.0–8.0)

## 2016-07-27 LAB — COMPREHENSIVE METABOLIC PANEL
ALBUMIN: 3.8 g/dL (ref 3.5–5.0)
ALT: 25 U/L (ref 14–54)
AST: 22 U/L (ref 15–41)
Alkaline Phosphatase: 102 U/L (ref 38–126)
Anion gap: 16 — ABNORMAL HIGH (ref 5–15)
BUN: 10 mg/dL (ref 6–20)
CHLORIDE: 105 mmol/L (ref 101–111)
CO2: 17 mmol/L — AB (ref 22–32)
CREATININE: 0.92 mg/dL (ref 0.44–1.00)
Calcium: 9.6 mg/dL (ref 8.9–10.3)
GFR calc Af Amer: >60 mL/min (ref >60)
GFR calc non Af Amer: >60 mL/min (ref >60)
GLUCOSE: 96 mg/dL (ref 65–99)
POTASSIUM: 3.8 mmol/L (ref 3.5–5.1)
SODIUM: 138 mmol/L (ref 135–145)
TOTAL PROTEIN: 6.7 g/dL (ref 6.5–8.1)
Total Bilirubin: 1.1 mg/dL (ref 0.3–1.2)

## 2016-07-27 LAB — URINE MICROSCOPIC-ADD ON

## 2016-07-27 LAB — I-STAT CG4 LACTIC ACID, ED
Lactic Acid, Venous: 2.07 mmol/L (ref 0.5–1.9)
Lactic Acid, Venous: 1.39 mmol/L (ref 0.5–1.9)
Lactic Acid, Venous: 1.37 mmol/L (ref 0.5–1.9)

## 2016-07-27 LAB — GLUCOSE, CAPILLARY
GLUCOSE-CAPILLARY: 72 mg/dL (ref 65–99)
GLUCOSE-CAPILLARY: 102 mg/dL — AB (ref 65–99)

## 2016-07-27 LAB — I-STAT BETA HCG BLOOD, ED (MC, WL, AP ONLY)

## 2016-07-27 MED ORDER — DEXTROSE 5 % IV SOLN
2.0000 g | Freq: Three times a day (TID) | INTRAVENOUS | Status: DC
  Filled 2016-07-27 (×2): qty 2

## 2016-07-27 MED ORDER — GABAPENTIN 300 MG PO CAPS
600.0000 mg | ORAL_CAPSULE | Freq: Three times a day (TID) | ORAL | Status: DC
  Administered 2016-07-27 – 2016-07-28 (×3): 600 mg via ORAL
  Filled 2016-07-27 (×3): qty 2

## 2016-07-27 MED ORDER — ENOXAPARIN SODIUM 40 MG/0.4ML ~~LOC~~ SOLN: 40.0000 mg | SUBCUTANEOUS | Status: DC

## 2016-07-27 MED ORDER — DEXTROSE 5 % IV SOLN
1.0000 g | INTRAVENOUS | Status: DC
  Administered 2016-07-27 – 2016-07-28 (×2): 1 g via INTRAVENOUS
  Filled 2016-07-27 (×3): qty 10

## 2016-07-27 MED ORDER — KETOROLAC TROMETHAMINE 15 MG/ML IJ SOLN
15.0000 mg | Freq: Once | INTRAMUSCULAR | Status: AC
  Administered 2016-07-27: 15 mg via INTRAVENOUS
  Filled 2016-07-27: qty 1

## 2016-07-27 MED ORDER — LEVOFLOXACIN IN D5W 750 MG/150ML IV SOLN
750.0000 mg | Freq: Once | INTRAVENOUS | Status: AC
  Administered 2016-07-27: 750 mg via INTRAVENOUS
  Filled 2016-07-27: qty 150

## 2016-07-27 MED ORDER — SODIUM CHLORIDE 0.9 % IV BOLUS (SEPSIS)
1000.0000 mL | Freq: Once | INTRAVENOUS | Status: AC
  Administered 2016-07-27: 1000 mL via INTRAVENOUS

## 2016-07-27 MED ORDER — PANTOPRAZOLE SODIUM 40 MG PO TBEC
40.0000 mg | DELAYED_RELEASE_TABLET | Freq: Every day | ORAL | Status: DC
  Administered 2016-07-27 – 2016-07-28 (×2): 40 mg via ORAL
  Filled 2016-07-27 (×2): qty 1

## 2016-07-27 MED ORDER — METOPROLOL SUCCINATE ER 25 MG PO TB24
50.0000 mg | ORAL_TABLET | Freq: Two times a day (BID) | ORAL | Status: DC
  Administered 2016-07-27 – 2016-07-28 (×2): 50 mg via ORAL
  Filled 2016-07-27 (×3): qty 2

## 2016-07-27 MED ORDER — DEXTROSE 5 % IV SOLN
2.0000 g | Freq: Once | INTRAVENOUS | Status: AC
  Administered 2016-07-27: 2 g via INTRAVENOUS
  Filled 2016-07-27: qty 2

## 2016-07-27 MED ORDER — DULOXETINE HCL 60 MG PO CPEP
60.0000 mg | ORAL_CAPSULE | Freq: Two times a day (BID) | ORAL | Status: DC
  Administered 2016-07-27 – 2016-07-28 (×2): 60 mg via ORAL
  Filled 2016-07-27 (×2): qty 1

## 2016-07-27 MED ORDER — INSULIN ASPART 100 UNIT/ML ~~LOC~~ SOLN: 0.0000 [IU] | Freq: Three times a day (TID) | SUBCUTANEOUS | Status: DC

## 2016-07-27 MED ORDER — OXYCODONE-ACETAMINOPHEN 5-325 MG PO TABS
1.0000 | ORAL_TABLET | Freq: Four times a day (QID) | ORAL | Status: DC | PRN
Start: 2016-07-27 — End: 2016-07-28
  Administered 2016-07-27 – 2016-07-28 (×4): 1 via ORAL
  Filled 2016-07-27 (×4): qty 1

## 2016-07-27 MED ORDER — LEVOFLOXACIN IN D5W 750 MG/150ML IV SOLN: 750.0000 mg | INTRAVENOUS | Status: DC

## 2016-07-27 MED ORDER — POLYETHYLENE GLYCOL 3350 17 G PO PACK
17.0000 g | PACK | Freq: Every day | ORAL | Status: DC
  Administered 2016-07-28: 17 g via ORAL
  Filled 2016-07-27 (×3): qty 1

## 2016-07-27 NOTE — ED Notes (Signed)
Report given to 3W.  

## 2016-07-27 NOTE — ED Triage Notes (Signed)
Pt poor historian, states she's unsure why she's here. Currently offers no complaints, denies pain, fever. Neck incision clean, dry intact. Pt alert, oriented to name, place, disoriented to situation and time. Pupils equal reactive.

## 2016-07-27 NOTE — Progress Notes (Signed)
Pharmacy Antibiotic Note  [name] [name] is a 65 y.o. female admitted on 07/27/2016 with sepsis.  Pharmacy has been consulted for aztreonam and levofloxacin dosing.  Recently admitted to [facility] for anterior cervical disectomies and fusion from C4-C7- discharged on 8/23.  One time doses ordered- aztreonam 2g IV and Levaquin 750mg  IV.  Plan: -Levofloxacin 750mg  IV q24h -Aztreonam 2g IV q8h -follow c/s, clinical progression, renal function    Temp (24hrs), Avg:98.3 F (36.8 C), Min:98.3 F (36.8 C), Max:98.3 F (36.8 C)   Recent Labs Lab 07/27/16 1020 07/27/16 1032  WBC 10.7*  --   LATICACIDVEN  --  2.07*    Estimated Creatinine Clearance: 55.9 mL/min (by C-G formula based on SCr of 0.97 mg/dL).    Allergies  Allergen Reactions  . Bee Venom Anaphylaxis  . Oxaprozin Other (See Comments)    insomnia  . Erythrocin [Erythromycin] Other (See Comments)    Unknown reaction  . Penicillins Other (See Comments)    Unknown reaction as a child. Has patient had a PCN reaction causing immediate rash, facial/tongue/throat swelling, SOB or lightheadedness with hypotension: unknown Has patient had a PCN reaction causing severe rash involving mucus membranes or skin necrosis: unknown Has patient had a PCN reaction that required hospitalization: Yes Has patient had a PCN reaction occurring within the last 10 years: Yes If all of the above answers are "NO", then may proceed with Cephalosporin use.   . Clindamycin/Lincomycin Rash  . Fioricet [Butalbital-Apap-Caffeine] Other (See Comments)    dizziness  . Hydrocodone-Acetaminophen Rash  . Morphine And Related Rash  . Phenobarbital Rash  . Teflaro [Ceftaroline] Rash  . Tramadol Other (See Comments)    insomnia  . Vancomycin Itching and Rash    Tolerated pre-op vanc 07/20/16    Antimicrobials this admission: Levofloxacin 8/28 >>  Aztreonam 8/28 >>   Dose adjustments this admission: n/a  Microbiology results: 8/28 BCx:  8/28 UCx:      Thank you for allowing pharmacy to be a part of this patient's care.  Armelia Penton D. Jezlyn Westerfield, PharmD, BCPS Clinical Pharmacist Pager: [phone] 07/27/2016 11:02 AM

## 2016-07-27 NOTE — ED Notes (Signed)
MD aware of patients request for pain medication.

## 2016-07-27 NOTE — ED Notes (Signed)
Attempted report 

## 2016-07-27 NOTE — H&P (Signed)
Date: 07/27/2016               Patient Name:  [name] [name] MRN: [id number]  DOB: [birth year] Age / Sex: 65 y.o., female   PCP: Charleston Poot, MD         Medical Service: Internal Medicine Teaching Service         Attending Physician: Dr. Annia Belt, MD    First Contact: Dr. Holley Raring Pager: [phone]  Second Contact: Dr. Charlott Rakes Pager: [phone]       After Hours (After 5p/  First Contact Pager: [phone]  weekends / holidays): Second Contact Pager: [phone]   Chief Complaint: Sepsis 2/2 UTI  History of Present Illness: [name] is a 65 y.o. female with a h/o of HTN, DM, GERD, cervical spine stenosis s/p C-4-7 discotomy and fusion who presents with confusion and agitation. She was found to have a leukocytes and nitrites on UA and leukocytosis in the ED.  Patient recently had neck surgery on 8/21. Since then, she reports that on Friday night, 8/25, she presented to the [facility] for neck pain and was treated with IV hydromorphone. A CT scan at that time showed no signs of infection and she was discharged home. Saturday morning, the pt's son found her down on the bedroom floor with AMS. Pt had no signs of traumatic injury, but was agitated and confused. She continued to worsen over the weekend and presented to [facility] ED with her son today. In the ED she had a WBC count of 10.7 and a LA of 2.07. CXR was negative for signs of PNA and Abd was not suspicious for infection. UA demonstrated leuks/nitrites and many bacteria, although the patient denies dysuria or changes to urination.  Pt was oriented but had difficulty remembering names of doctors and parts of her history. The history was augmented by her son. They report that she has had several UTIs in the past which resulted in AMS, most recently several months ago when visiting in [place]. She also notes a history of sepsis 2/2 cellulitis of the leg several years ago. Patient notes several allergies to  antibiotics, but it not able to specify reactions.  She denies CP, SOB, abd pain, N/V, const/diarrhea, urinary sx, myalgias, LE swelling, flank pain.  Meds: Current Facility-Administered Medications  Medication Dose Route Frequency Provider Last Rate Last Dose  . aztreonam (AZACTAM) 2 g in dextrose 5 % 50 mL IVPB  2 g Intravenous Q8H Lauren D Bajbus, RPH      . [START ON 07/28/2016] levofloxacin (LEVAQUIN) IVPB 750 mg  750 mg Intravenous Q24H Lauren D Bajbus, RPH       Current Outpatient Prescriptions  Medication Sig Dispense Refill  . conjugated estrogens (PREMARIN) vaginal cream Place 1 Applicatorful vaginally as directed. Twice a week.    . metoprolol succinate (TOPROL-XL) 50 MG 24 hr tablet Take 50 mg by mouth 2 (two) times daily.  0  . oxyCODONE-acetaminophen (PERCOCET/ROXICET) 5-325 MG tablet Take 1 tablet by mouth every 6 (six) hours as needed for moderate pain. 60 tablet 0  . bumetanide (BUMEX) 1 MG tablet Take 0.5-1 mg by mouth 2 (two) times daily. 1 tablet in the AM, 0.5 tablet in the PM.    . calcium carbonate (TUMS - DOSED IN MG ELEMENTAL CALCIUM) 500 MG chewable tablet Chew 1 tablet by mouth daily as needed for indigestion or heartburn.     . calcium-vitamin D (OSCAL WITH D) 500-200 MG-UNIT tablet Take  1 tablet by mouth daily with breakfast.    . doxepin (SINEQUAN) 25 MG capsule Take 25 mg by mouth 3 (three) times daily as needed (itching).     . DULoxetine (CYMBALTA) 60 MG capsule Take 60 mg by mouth 2 (two) times daily.    Marland Kitchen gabapentin (NEURONTIN) 300 MG capsule Take 600 mg by mouth 3 (three) times daily.    Marland Kitchen loperamide (IMODIUM) 2 MG capsule Take 2 mg by mouth daily as needed for diarrhea or loose stools.    . magnesium oxide (MAG-OX) 400 MG tablet Take 400 mg by mouth daily.    . metFORMIN (GLUCOPHAGE) 500 MG tablet Take 500 mg by mouth at bedtime.     . metroNIDAZOLE (METROGEL) 1 % gel Apply 1 application topically 2 (two) times daily.    . Multiple Vitamin (MULTIVITAMIN  WITH MINERALS) TABS Take 1 tablet by mouth 2 (two) times daily.    . ondansetron (ZOFRAN) 8 MG tablet Take 8 mg by mouth every 8 (eight) hours as needed for nausea or vomiting.    Marland Kitchen oxyCODONE-acetaminophen (PERCOCET/ROXICET) 5-325 MG per tablet Take 1 tablet by mouth every 6 (six) hours as needed for pain.    . pantoprazole (PROTONIX) 40 MG tablet Take 40 mg by mouth daily.    . polyethylene glycol (MIRALAX / GLYCOLAX) packet Take 17 g by mouth daily.    . potassium chloride (MICRO-K) 10 MEQ CR capsule Take 10 mEq by mouth 2 (two) times daily.    . solifenacin (VESICARE) 5 MG tablet Take 10 mg by mouth daily.     Marland Kitchen spironolactone (ALDACTONE) 50 MG tablet Take 50 mg by mouth 2 (two) times daily.     Marland Kitchen triamcinolone cream (KENALOG) 0.1 % Apply 1 application topically 2 (two) times daily as needed (affected area).     Allergies: Allergies as of 07/27/2016 - Review Complete 07/27/2016  Allergen Reaction Noted  . Bee venom Anaphylaxis 01/24/2013  . Oxaprozin Other (See Comments) 07/15/2016  . Erythrocin [erythromycin] Other (See Comments) 02/06/2013  . Penicillins Other (See Comments) 01/24/2013  . Clindamycin/lincomycin Rash 02/06/2013  . Fioricet [butalbital-apap-caffeine] Other (See Comments) 02/06/2013  . Hydrocodone-acetaminophen Rash 09/25/2015  . Morphine and related Rash 01/24/2013  . Phenobarbital Rash 01/24/2013  . Teflaro [ceftaroline] Rash 02/06/2013  . Tramadol Other (See Comments) 07/15/2016  . Vancomycin Itching and Rash 02/06/2013   Past Medical History:  Diagnosis Date  . Abnormal kidney function    with extremity swelling  . Anxiety   . Arthritis   . Cancer ([place])    skin cancer  . Chronic back pain   . Chronic pain   . Constipation    takes Miralax daily  . Depression    takes Cymbalta daily  . Diabetes mellitus without complication ([place])    takes Metformin daily  . GERD (gastroesophageal reflux disease)    takes Protonix daily  . H/O hiatal hernia   .  Headache    last migraine 2 months ago  . History of bronchitis    winter 2016  . History of colon polyps    benign  . History of gout   . History of staph infection    many,many yrs ago  . Hyperlipidemia   . Hypertension    takes Metoprolol daily  . Joint pain   . Low iron    after a miscarriage  . Neuropathy ([facility])    takes Gabapentin daily  . Overactive bladder    takes Vesicare daily  .  Pancreatitis   . Peripheral neuropathy ([place])   . Seizures ([place])    last one about 2 yrs ago  . Sleep apnea    does not use Cpap - couldn't keep mask on.  . Slow urinary stream    sees urologist and every 3 months per pt they "ream" her out  . SVT (supraventricular tachycardia) ([place])    on pt's medical record from PCP - pt doesn't remember   Family History: Cancer: Mother CHF: Father  Social History: Lives by herself at home with sons who check on her. She is a retired Education officer, [employer]. She does not use tobacco, used to drink socially 1x/mo, denies illicit drug use.  Review of Systems: A complete ROS was negative except as per HPI. Review of Systems  Constitutional: Negative for chills, diaphoresis, fever, malaise/fatigue and weight loss.  HENT: Negative for congestion and sore throat.   Respiratory: Negative for cough and shortness of breath.   Cardiovascular: Negative for chest pain and leg swelling.  Gastrointestinal: Negative for abdominal pain, constipation, diarrhea, nausea and vomiting.  Genitourinary: Negative for dysuria, flank pain, frequency, hematuria and urgency.  Musculoskeletal: Negative for myalgias.  Neurological: Negative for dizziness and headaches.  Psychiatric/Behavioral: Positive for memory loss. The patient is nervous/anxious.    Physical Exam: Vitals:   07/27/16 1300 07/27/16 1315 07/27/16 1330 07/27/16 1400  BP: 138/79  144/89 153/81  Pulse: 106 103 108 104  Resp:  16 21 22   Temp:      TempSrc:      SpO2: 100% 100% 100% 100%   Physical Exam    Constitutional: She is oriented to person, place, and time. She appears well-developed. She is cooperative. She appears distressed.  HENT:  Head: Normocephalic and atraumatic.  Right Ear: Hearing normal.  Left Ear: Hearing normal.  Nose: Nose normal.  Mouth/Throat: Oropharynx is clear and moist and mucous membranes are normal. No oropharyngeal exudate.  Eyes: Conjunctivae and EOM are normal. Pupils are equal, round, and reactive to light.  Neck: Neck supple.  antior surgical wound, well healing w/o drainage or exudate, mild TTP around site  Cardiovascular: Regular rhythm, S1 normal, S2 normal and intact distal pulses.  Tachycardia present.  Exam reveals no gallop.   No murmur heard. Pulmonary/Chest: Effort normal and breath sounds normal. No respiratory distress. She has no wheezes. She has no rhonchi. She has no rales. She exhibits no tenderness. Breasts are symmetrical.  Abdominal: Soft. Normal appearance and bowel sounds are normal. She exhibits no distension and no ascites. There is no hepatosplenomegaly. There is no tenderness. There is no rebound, no guarding and no CVA tenderness.  Musculoskeletal: Normal range of motion. She exhibits edema (trace BLE).  Neurological: She is alert and oriented to person, place, and time. She has normal strength and normal reflexes. No cranial nerve deficit.  Skin: Skin is warm, dry and intact. She is not diaphoretic.  Psychiatric: Her speech is normal. Her mood appears anxious. She is agitated. Cognition and memory are impaired.   Labs: CBC:  Recent Labs Lab 07/27/16 1020  WBC 10.7*  NEUTROABS 8.2*  HGB 13.9  HCT 43.5  MCV 85.5  PLT 123456   Basic Metabolic Panel:  Recent Labs Lab 07/27/16 1020  NA 138  K 3.8  CL 105  CO2 17*  GLUCOSE 96  BUN 10  CREATININE 0.92  CALCIUM 9.6  Anion Gap:    16  Liver Function Tests:  Recent Labs Lab 07/27/16 1020  AST 22  ALT 25  ALKPHOS 102  BILITOT 1.1  PROT 6.7  ALBUMIN 3.8    CBG: Lab Results  Component Value Date   HGBA1C 6.3 (H) 07/15/2016    Recent Labs Lab 07/20/16 2137 07/21/16 0836 07/21/16 1302 07/21/16 1755 07/21/16 2033  GLUCAP 217* 107* 81 129* 157*   Urinalysis    Component Value Date/Time   COLORURINE YELLOW 07/27/2016 [place] 07/27/2016 1241   LABSPEC 1.023 07/27/2016 1241   PHURINE 6.0 07/27/2016 1241   GLUCOSEU NEGATIVE 07/27/2016 1241   HGBUR NEGATIVE 07/27/2016 1241   BILIRUBINUR NEGATIVE 07/27/2016 1241   KETONESUR >80 (A) 07/27/2016 1241   PROTEINUR NEGATIVE 07/27/2016 1241   UROBILINOGEN 1.0 10/10/2015 0945   NITRITE POSITIVE (A) 07/27/2016 1241   LEUKOCYTESUR SMALL (A) 07/27/2016 1241   Imaging: Dg Chest 2 View  Result Date: 07/27/2016 CLINICAL DATA:  Pt poor historian, states she's unsure why she's here. Currently offers no complaints, denies pain, fever. Hx of diabetes, skin ca, hiatal hernia, bronchitis, HTN, seizures, SVT. Non-Smoker. EXAM: CHEST  2 VIEW COMPARISON:  04/01/2016 FINDINGS: Cardiac silhouette is normal in size and configuration. No mediastinal or hilar masses or evidence of adenopathy. Lungs are clear.  No pleural effusion or pneumothorax. Bony thorax is demineralized. There has been anterior cervical spine fusion since the prior exam. IMPRESSION: No active cardiopulmonary disease. Electronically Signed   By: Lajean Manes M.D.   On: 07/27/2016 10:42   Assessment & Plan by Problem: Active Problems:   UTI (lower urinary tract infection)  Ms. KIP MCINERNY is a 65 y.o. female with h/o cervical discectomy/fusion, HTN, DM, GERD who presents with AMS in the setting of possible fall vs infection.  1) AMS: Pt found down at home on 8/26 with AMS. No signs of external trauma. Leukocytosis and dirty urine suggest UTI. Electrolytes wnl. Will evaluate for ICH vs. Infectious encephalopathy. - CT Head - hold lovenox  2) Sepsis: presumed 2/2 UTI w/ leukocytosis and dirty urine. Neg CXR,  benign abd exam. Pt reports h/o confusion w/ past UTIs. Received Azactam / Levaquin in the ED. AMS may be worsened by fluoroquinolone. - Switch to Ceftriaxone per pharm - f/u UCx and BCx, narrow Abx as appropriate - CBC/BMP in AM  3) s/p cervical discectomy/fusion: recent surgery on 07/20/16 w/ Neurosurgery. Well healing ant cervical wound, no signs of site infection. Mild TTP. - percocet 5-325mg  q6h - gabapentin 600mg  TID  4) HTN: Continue home meds Metoprolol 50mg  BID. 5) GERD: Continue home pantoprazole 40mg  qD 6) Depression: Continue home duloxetine 60mg  BID  DVT PPx - SCD's while in bed, switch to Lovenox SQ if CT Head clears  Code Status - DNR  Consults Placed - Neurosurg FYI given surgery last week  Dispo: Admit patient to Inpatient for IV Abx therapy.  Signed: Holley Raring, MD 07/27/2016, 2:15 PM  Pager: [phone]

## 2016-07-27 NOTE — Progress Notes (Signed)
Pharmacy Antibiotic Note  [name] [name] is a 65 y.o. female admitted on 07/27/2016 withconfusion, agitation, and suspected UTI.  Pharmacy has been consulted for Rocephin dosing.  Pts medical management is complicated by multiple antibiotic allergies.  Pt specifically reports a rash to Ceftaroline (cephalosporin) in the past.  MD is aware and would like to proceed with Rocephin and monitor the patient.  RN is also aware to monitor the patient closely for possible allergic reaction.  Plan: Rocephin 1gm IV q24h  No further dose adjustments anticipated. Rx will follow x 24 hours to evaluate for allergic reaction then likely sign off.     Temp (24hrs), Avg:98.6 F (37 C), Min:98.3 F (36.8 C), Max:98.8 F (37.1 C)   Recent Labs Lab 07/27/16 1020 07/27/16 1032 07/27/16 1159 07/27/16 1326  WBC 10.7*  --   --   --   CREATININE 0.92  --   --   --   LATICACIDVEN  --  2.07* 1.39 1.37    Estimated Creatinine Clearance: 58.9 mL/min (by C-G formula based on SCr of 0.92 mg/dL).    Allergies  Allergen Reactions  . Bee Venom Anaphylaxis  . Oxaprozin Other (See Comments)    insomnia  . Erythrocin [Erythromycin] Other (See Comments)    Unknown reaction  . Penicillins Other (See Comments)    Unknown reaction as a child. Has patient had a PCN reaction causing immediate rash, facial/tongue/throat swelling, SOB or lightheadedness with hypotension: unknown Has patient had a PCN reaction causing severe rash involving mucus membranes or skin necrosis: unknown Has patient had a PCN reaction that required hospitalization: Yes Has patient had a PCN reaction occurring within the last 10 years: Yes If all of the above answers are "NO", then may proceed with Cephalosporin use.   . Clindamycin/Lincomycin Rash  . Fioricet [Butalbital-Apap-Caffeine] Other (See Comments)    dizziness  . Hydrocodone-Acetaminophen Rash  . Morphine And Related Rash  . Phenobarbital Rash  . Teflaro [Ceftaroline]  Rash  . Tramadol Other (See Comments)    insomnia  . Vancomycin Itching and Rash    Tolerated pre-op vanc 07/20/16    Antimicrobials this admission: Aztreonam 8/28 x 1 Levaquin 8/28 x 1 Rocephin 8/28 >>  Dose adjustments this admission: none  Microbiology results: 8/28 Urine cx pending   Thank you for allowing pharmacy to be a part of this patient's care.  [employer], Pharm.D., BCPS Clinical Pharmacist Pager [phone] 07/27/2016 5:29 PM

## 2016-07-27 NOTE — ED Provider Notes (Signed)
Emergency Department Provider Note   I have reviewed the triage vital signs and the nursing notes.   HISTORY  Chief Complaint Post-op Problem and Altered Mental Status   HPI [name] [name] is a 65 y.o. female with PMH of anxiety, chronic back pain, GERD, and cervical spine stenosis s/p C4-7 discotomy and fusion (8/21) presents to the emergency department for evaluation of progressively worsening delirium. The patient appears agitated and mildly confused. She responds "I don't know" to most questioning and has difficulty describing her symptoms. The patient's son is at bedside and states that approximately 36 hours after returning home she became more confused. He says that she was saying bizarre things in complaining of pain in her back. He reports some bowel and bladder incontinence at home. No known fevers. He is unsure if she's been compliant with her home medications. He felt the symptoms are gradually worsening with no exacerbating or alleviating factors.  Past Medical History:  Diagnosis Date  . Abnormal kidney function    with extremity swelling  . Anxiety   . Arthritis   . Cancer ([place])    skin cancer  . Chronic back pain   . Chronic pain   . Constipation    takes Miralax daily  . Depression    takes Cymbalta daily  . Diabetes mellitus without complication ([place])    takes Metformin daily  . GERD (gastroesophageal reflux disease)    takes Protonix daily  . H/O hiatal hernia   . Headache    last migraine 2 months ago  . History of bronchitis    winter 2016  . History of colon polyps    benign  . History of gout   . History of staph infection    many,many yrs ago  . Hyperlipidemia   . Hypertension    takes Metoprolol daily  . Joint pain   . Low iron    after a miscarriage  . Neuropathy (HCC)    takes Gabapentin daily  . Overactive bladder    takes Vesicare daily  . Pancreatitis   . Peripheral neuropathy ([place])   . Seizures ([place])    last one about 2  yrs ago  . Sleep apnea    does not use Cpap - couldn't keep mask on.  . Slow urinary stream    sees urologist and every 3 months per pt they "ream" her out  . SVT (supraventricular tachycardia) ([place])    on pt's medical record from PCP - pt doesn't remember    Patient Active Problem List   Diagnosis Date Noted  . Spinal stenosis of cervical region 07/20/2016  . Adjustment disorder with mixed anxiety and depressed mood 10/11/2015  . Spinal stenosis of lumbar region 10/04/2015  . Unspecified essential hypertension 05/03/2014  . Minigastric bypass in [facility] 2006 05/03/2014  . Hypercholesteremia 05/03/2014  . Cellulitis and abscess of leg, except foot 05/03/2014  . Diffuse cystic mastopathy 05/03/2014  . COPD 05/03/2014  . Urethral stricture unspecified 05/03/2014  . Hx of decompressive lumbar laminectomy2014 Saintclair Halsted 05/03/2014    Past Surgical History:  Procedure Laterality Date  . ABDOMINAL HYSTERECTOMY    . ANTERIOR CERVICAL DECOMP/DISCECTOMY FUSION N/A 07/20/2016   Procedure: Anterior Cervical Discectomy Fusion - Cervical four-Cervical five- Cervical five -Cervical six - Cervical six- Cervical seven;  Surgeon: Kary Kos, MD;  Location: [place] [facility];  Service: Neurosurgery;  Laterality: N/A;  . BACK SURGERY     x 2   . BREATH TEK  H PYLORI N/A 06/18/2014   Procedure: BREATH TEK H PYLORI;  Surgeon: Pedro Earls, MD;  Location: [facility];  Service: General;  Laterality: N/A;  . CESAREAN SECTION     x 3  . COLONOSCOPY    . ESOPHAGOGASTRODUODENOSCOPY    . EYE SURGERY Bilateral    cataract surgery  . GASTRIC BYPASS     minigastric bypass 2006 in [facility]  . HERNIA REPAIR    . JOINT REPLACEMENT     bil knee  . LAPAROSCOPIC BILATERAL SALPINGO OOPHERECTOMY    . TONSILLECTOMY      Current Outpatient Rx  . Order #: [id number] Class: Historical Med  . Order #: [id number] Class: Historical Med  . Order #: [id number] Class: Historical Med  . Order #: [id number] Class: Historical  Med  . Order #: [id number] Class: Historical Med  . Order #: [id number] Class: Historical Med  . Order #: [id number] Class: Historical Med  . Order #: [id number] Class: Historical Med  . Order #: [id number] Class: Historical Med  . Order #: [id number] Class: Historical Med  . Order #: [id number] Class: Historical Med  . Order #: [id number] Class: Historical Med  . Order #: [id number] Class: Historical Med  . Order #: [id number] Class: Historical Med  . Order #: [id number] Class: Historical Med  . Order #: [id number] Class: Print  . Order #: [id number] Class: Historical Med  . Order #: [id number] Class: Historical Med  . Order #: [id number] Class: Historical Med  . Order #: [id number] Class: Historical Med  . Order #: [id number] Class: Historical Med  . Order #: [id number] Class: Historical Med    Allergies Bee venom; Oxaprozin; Erythrocin [erythromycin]; Penicillins; Clindamycin/lincomycin; Fioricet [butalbital-apap-caffeine]; Hydrocodone-acetaminophen; Morphine and related; Phenobarbital; Teflaro [ceftaroline]; Tramadol; and Vancomycin  Family History  Problem Relation Age of Onset  . COPD Mother   . Cancer Mother   . Heart failure Father   . Hypertension Sister   . Hyperlipidemia Sister   . Obesity Sister     Social History Social History  Substance Use Topics  . Smoking status: Never Smoker  . Smokeless tobacco: Never Used  . Alcohol use Yes     Comment: socially,once a month    Review of Systems  Constitutional: No fever/chills Eyes: No visual changes. ENT: No sore throat. Cardiovascular: Denies chest pain. Respiratory: Denies shortness of breath. Gastrointestinal: No abdominal pain.  No nausea, no vomiting.  No diarrhea.  No constipation. Positive bowel and bladder incontinence.  Genitourinary: Negative for dysuria. Musculoskeletal: Positive for chronic back pain. Positive neck pain in post-op setting.  Skin: Negative for rash. Neurological: Negative for headaches, focal weakness or  numbness.  10-point ROS otherwise negative.  ____________________________________________   PHYSICAL EXAM:  VITAL SIGNS: ED Triage Vitals [07/27/16 1013]  Enc Vitals Group     BP 159/84     Pulse Rate 114     Resp 22     Temp 98.3 F (36.8 C)     Temp Source Oral     SpO2 100 %   Constitutional: Alert but moderately agitated and appears anxious.  Eyes: Conjunctivae are normal. PERRL. Head: Atraumatic. Nose: No congestion/rhinnorhea. Mouth/Throat: Mucous membranes are moist.  Oropharynx non-erythematous. Neck: No stridor.  No meningeal signs. Incision over right anterior neck is clean, dry, and intact. Seri-strips in place.  Cardiovascular: Sinus tachycardia. Good peripheral circulation. Grossly normal heart sounds.   Respiratory: Normal respiratory effort.  No retractions. Lungs CTAB. Gastrointestinal: Soft and nontender. No distention.  Musculoskeletal: No lower extremity tenderness nor edema. No gross deformities of extremities. Neurologic:  Normal speech and language. No gross  focal neurologic deficits are appreciated.  Skin:  Skin is warm, dry and intact. No rash noted. Psychiatric: Mood and affect are normal. Speech and behavior are normal. ____________________________________________   LABS (all labs ordered are listed, but only abnormal results are displayed)  Labs Reviewed  COMPREHENSIVE METABOLIC PANEL - Abnormal; Notable for the following:       Result Value   CO2 17 (*)    Anion gap 16 (*)    All other components within normal limits  CBC WITH DIFFERENTIAL/PLATELET - Abnormal; Notable for the following:    WBC 10.7 (*)    Neutro Abs 8.2 (*)    All other components within normal limits  URINALYSIS, ROUTINE W REFLEX MICROSCOPIC (NOT AT [facility]) - Abnormal; Notable for the following:    Ketones, ur >80 (*)    Nitrite POSITIVE (*)    Leukocytes, UA SMALL (*)    All other components within normal limits  URINE MICROSCOPIC-ADD ON - Abnormal; Notable for the  following:    Squamous Epithelial / LPF 0-5 (*)    Bacteria, UA MANY (*)    All other components within normal limits  GLUCOSE, CAPILLARY - Abnormal; Notable for the following:    Glucose-Capillary 102 (*)    All other components within normal limits  I-STAT CG4 LACTIC ACID, ED - Abnormal; Notable for the following:    Lactic Acid, Venous 2.07 (*)    All other components within normal limits  CULTURE, BLOOD (ROUTINE X 2)  CULTURE, BLOOD (ROUTINE X 2)  URINE CULTURE  GLUCOSE, CAPILLARY  GLUCOSE, CAPILLARY  BASIC METABOLIC PANEL  CBC  I-STAT BETA HCG BLOOD, ED ([facility], [facility], [facility] ONLY)  I-STAT CG4 LACTIC ACID, ED  I-STAT CG4 LACTIC ACID, ED  I-STAT CG4 LACTIC ACID, ED    ____________________________________________  RADIOLOGY  Dg Chest 2 View  Result Date: 07/27/2016 CLINICAL DATA:  Pt poor historian, states she's unsure why she's here. Currently offers no complaints, denies pain, fever. Hx of diabetes, skin ca, hiatal hernia, bronchitis, HTN, seizures, SVT. Non-Smoker. EXAM: CHEST  2 VIEW COMPARISON:  04/01/2016 FINDINGS: Cardiac silhouette is normal in size and configuration. No mediastinal or hilar masses or evidence of adenopathy. Lungs are clear.  No pleural effusion or pneumothorax. Bony thorax is demineralized. There has been anterior cervical spine fusion since the prior exam. IMPRESSION: No active cardiopulmonary disease. Electronically Signed   By: Lajean Manes M.D.   On: 07/27/2016 10:42    ____________________________________________   PROCEDURES  Procedure(s) performed:   Procedures  CRITICAL CARE Performed by: Margette Fast Total critical care time: 35 minutes Critical care time was exclusive of separately billable procedures and treating other patients. Critical care was necessary to treat or prevent imminent or life-threatening deterioration. Critical care was time spent personally by me on the following activities: development of treatment plan with patient  and/or surrogate as well as nursing, discussions with consultants, evaluation of patient's response to treatment, examination of patient, obtaining history from patient or surrogate, ordering and performing treatments and interventions, ordering and review of laboratory studies, ordering and review of radiographic studies, pulse oximetry and re-evaluation of patient's condition.  Patient had an elevated lactate with urosepsis, AMS requiring frequent re-evaluation, IVF bolus, and multiple IV antibiotics.   Nanda Quinton, MD Emergency Medicine  ____________________________________________   INITIAL IMPRESSION / ASSESSMENT AND PLAN / ED COURSE  Pertinent labs & imaging results that were available during my care of the patient were reviewed by me and considered in  my medical decision making (see chart for details).  Patient presents to the [facility] for evaluation of confusion and moderate agitation in the postoperative setting. She has mild leukocytosis with tachycardia, tachypnea, and mild elevated lactate. Have sent blood cultures. We'll cath for urinalysis. Normal chest x-ray. Will initiate antibiotics at this time given this clinical presentation and concern for underlying infection. Patient is not hypotensive. We'll give IV fluid bolus and reassess. Initiated code sepsis with AMS, elevated lactate, and 3/4 SIRS criteria.   01:37 PM Discussed case with neurosurgery call service. They will make the on-call neurosurgeon aware that a patient of Dr. Saintclair Halsted will be admitted for urinary tract infection. Antibiotics given. Paged medicine team for admission.   Discussed admission with medicine team. They will be down to place admission orders. Updated patient and family at bedside.  ____________________________________________  FINAL CLINICAL IMPRESSION(S) / ED DIAGNOSES  Final diagnoses:  UTI (lower urinary tract infection)  Altered mental status, unspecified altered mental status type  Acute  encephalopathy     MEDICATIONS GIVEN DURING THIS VISIT:  Medications  polyethylene glycol (MIRALAX / GLYCOLAX) packet 17 g (17 g Oral Not Given 07/27/16 2113)  DULoxetine (CYMBALTA) DR capsule 60 mg (60 mg Oral Given 07/27/16 2112)  gabapentin (NEURONTIN) capsule 600 mg (600 mg Oral Given 07/27/16 2112)  metoprolol succinate (TOPROL-XL) 24 hr tablet 50 mg (50 mg Oral Given 07/27/16 2112)  oxyCODONE-acetaminophen (PERCOCET/ROXICET) 5-325 MG per tablet 1 tablet (1 tablet Oral Given 07/28/16 0826)  pantoprazole (PROTONIX) EC tablet 40 mg (40 mg Oral Given 07/27/16 1813)  insulin aspart (novoLOG) injection 0-9 Units (0 Units Subcutaneous Not Given 07/28/16 0800)  cefTRIAXone (ROCEPHIN) 1 g in dextrose 5 % 50 mL IVPB (1 g Intravenous Given 07/27/16 1757)  acetaminophen (TYLENOL) tablet 1,000 mg (not administered)  enoxaparin (LOVENOX) injection 30 mg (30 mg Subcutaneous Given 07/28/16 0755)  sodium chloride 0.9 % bolus 1,000 mL (0 mLs Intravenous Stopped 07/27/16 1331)  levofloxacin (LEVAQUIN) IVPB 750 mg (0 mg Intravenous Stopped 07/27/16 1330)  aztreonam (AZACTAM) 2 g in dextrose 5 % 50 mL IVPB (0 g Intravenous Stopped 07/27/16 1503)  ketorolac (TORADOL) 15 MG/ML injection 15 mg (15 mg Intravenous Given 07/27/16 2349)  ketorolac (TORADOL) 15 MG/ML injection 15 mg (15 mg Intravenous Given 07/28/16 0412)     NEW OUTPATIENT MEDICATIONS STARTED DURING THIS VISIT:  None   Note:  This document was prepared using Dragon voice recognition software and may include unintentional dictation errors.  Nanda Quinton, MD Emergency Medicine   Margette Fast, MD 07/28/16 [phone]

## 2016-07-27 NOTE — Consult Note (Signed)
Reason for Consult: Mental status changes a week out from her ACDF Referring Physician: Emergency department  [name] [name] is an 65 y.o. female.  HPI: Patient is 65 year old female is 1 week out from an ACDF who was convalescing well at home however was found down by her family with some concerns about how she was taking her medications. She is noting markedly confused brought to the ER worked up and has been diagnosed with urinary tract infection. Patient is been admitted to medicine for workup of mental status changes and treatment for UTI. Patient currently denies any significant neck pain denies any arm pain seems like that's better than it was preoperatively. She really is a very poor historian she was not able to elucidate why she was here or any active problems. She denies having any historical symptoms of a UTI recently.  Past Medical History:  Diagnosis Date  . Abnormal kidney function    with extremity swelling  . Anxiety   . Arthritis   . Cancer ([place])    skin cancer  . Chronic back pain   . Chronic pain   . Constipation    takes Miralax daily  . Depression    takes Cymbalta daily  . Diabetes mellitus without complication ([place])    takes Metformin daily  . GERD (gastroesophageal reflux disease)    takes Protonix daily  . H/O hiatal hernia   . Headache    last migraine 2 months ago  . History of bronchitis    winter 2016  . History of colon polyps    benign  . History of gout   . History of staph infection    many,many yrs ago  . Hyperlipidemia   . Hypertension    takes Metoprolol daily  . Joint pain   . Low iron    after a miscarriage  . Neuropathy ([facility])    takes Gabapentin daily  . Overactive bladder    takes Vesicare daily  . Pancreatitis   . Peripheral neuropathy ([place])   . Seizures ([place])    last one about 2 yrs ago  . Sleep apnea    does not use Cpap - couldn't keep mask on.  . Slow urinary stream    sees urologist and every 3 months per pt they  "ream" her out  . SVT (supraventricular tachycardia) ([place])    on pt's medical record from PCP - pt doesn't remember    Past Surgical History:  Procedure Laterality Date  . ABDOMINAL HYSTERECTOMY    . ANTERIOR CERVICAL DECOMP/DISCECTOMY FUSION N/A 07/20/2016   Procedure: Anterior Cervical Discectomy Fusion - Cervical four-Cervical five- Cervical five -Cervical six - Cervical six- Cervical seven;  Surgeon: Kary Kos, MD;  Location: [facility];  Service: Neurosurgery;  Laterality: N/A;  . BACK SURGERY     x 2   . BREATH TEK H PYLORI N/A 06/18/2014   Procedure: BREATH TEK H PYLORI;  Surgeon: Pedro Earls, MD;  Location: [facility];  Service: General;  Laterality: N/A;  . CESAREAN SECTION     x 3  . COLONOSCOPY    . ESOPHAGOGASTRODUODENOSCOPY    . EYE SURGERY Bilateral    cataract surgery  . GASTRIC BYPASS     minigastric bypass 2006 in [facility]  . HERNIA REPAIR    . JOINT REPLACEMENT     bil knee  . LAPAROSCOPIC BILATERAL SALPINGO OOPHERECTOMY    . TONSILLECTOMY      Family History  Problem Relation  Age of Onset  . COPD Mother   . Cancer Mother     Lung, ab, breast  . Heart failure Father   . Hypertension Sister   . Hyperlipidemia Sister   . Obesity Sister     Social History:  reports that she has never smoked. She has never used smokeless tobacco. She reports that she drinks alcohol. She reports that she does not use drugs.  Allergies:  Allergies  Allergen Reactions  . Bee Venom Anaphylaxis  . Oxaprozin Other (See Comments)    insomnia  . Erythrocin [Erythromycin] Other (See Comments)    Unknown reaction  . Penicillins Other (See Comments)    Unknown reaction as a child. Has patient had a PCN reaction causing immediate rash, facial/tongue/throat swelling, SOB or lightheadedness with hypotension: unknown Has patient had a PCN reaction causing severe rash involving mucus membranes or skin necrosis: unknown Has patient had a PCN reaction that required  hospitalization: Yes Has patient had a PCN reaction occurring within the last 10 years: Yes If all of the above answers are "NO", then may proceed with Cephalosporin use.   . Clindamycin/Lincomycin Rash  . Fioricet [Butalbital-Apap-Caffeine] Other (See Comments)    dizziness  . Hydrocodone-Acetaminophen Rash  . Morphine And Related Rash  . Phenobarbital Rash  . Teflaro [Ceftaroline] Rash  . Tramadol Other (See Comments)    insomnia  . Vancomycin Itching and Rash    Tolerated pre-op vanc 07/20/16    Medications: I have reviewed the patient's current medications.  Results for orders placed or performed during the hospital encounter of 07/27/16 (from the past 48 hour(s))  Comprehensive metabolic panel     Status: Abnormal   Collection Time: 07/27/16 10:20 AM  Result Value Ref Range   Sodium 138 135 - 145 mmol/L   Potassium 3.8 3.5 - 5.1 mmol/L   Chloride 105 101 - 111 mmol/L   CO2 17 (L) 22 - 32 mmol/L   Glucose, Bld 96 65 - 99 mg/dL   BUN 10 6 - 20 mg/dL   Creatinine, Ser 0.92 0.44 - 1.00 mg/dL   Calcium 9.6 8.9 - 10.3 mg/dL   Total Protein 6.7 6.5 - 8.1 g/dL   Albumin 3.8 3.5 - 5.0 g/dL   AST 22 15 - 41 U/L   ALT 25 14 - 54 U/L   Alkaline Phosphatase 102 38 - 126 U/L   Total Bilirubin 1.1 0.3 - 1.2 mg/dL   GFR calc non Af Amer >60 >60 mL/min   GFR calc Af Amer >60 >60 mL/min    Comment: (NOTE) The eGFR has been calculated using the CKD EPI equation. This calculation has not been validated in all clinical situations. eGFR's persistently <60 mL/min signify possible Chronic Kidney Disease.    Anion gap 16 (H) 5 - 15  CBC with Differential     Status: Abnormal   Collection Time: 07/27/16 10:20 AM  Result Value Ref Range   WBC 10.7 (H) 4.0 - 10.5 K/uL   RBC 5.09 3.87 - 5.11 MIL/uL   Hemoglobin 13.9 12.0 - 15.0 g/dL   HCT 43.5 36.0 - 46.0 %   MCV 85.5 78.0 - 100.0 fL   MCH 27.3 26.0 - 34.0 pg   MCHC 32.0 30.0 - 36.0 g/dL   RDW 13.7 11.5 - 15.5 %   Platelets 280 150 -  400 K/uL   Neutrophils Relative % 77 %   Neutro Abs 8.2 (H) 1.7 - 7.7 K/uL   Lymphocytes Relative 14 %  Lymphs Abs 1.5 0.7 - 4.0 K/uL   Monocytes Relative 8 %   Monocytes Absolute 0.9 0.1 - 1.0 K/uL   Eosinophils Relative 1 %   Eosinophils Absolute 0.1 0.0 - 0.7 K/uL   Basophils Relative 0 %   Basophils Absolute 0.0 0.0 - 0.1 K/uL  I-Stat beta hCG blood, ED     Status: None   Collection Time: 07/27/16 10:30 AM  Result Value Ref Range   I-stat hCG, quantitative <5.0 <5 mIU/mL   Comment 3            Comment:   GEST. AGE      CONC.  (mIU/mL)   <=1 WEEK        5 - 50     2 WEEKS       50 - 500     3 WEEKS       100 - 10,000     4 WEEKS     1,000 - 30,000        FEMALE AND NON-PREGNANT FEMALE:     LESS THAN 5 mIU/mL   I-Stat CG4 Lactic Acid, ED     Status: Abnormal   Collection Time: 07/27/16 10:32 AM  Result Value Ref Range   Lactic Acid, Venous 2.07 (HH) 0.5 - 1.9 mmol/L   Comment NOTIFIED PHYSICIAN   I-Stat CG4 Lactic Acid, ED  (not at  [facility])     Status: None   Collection Time: 07/27/16 11:59 AM  Result Value Ref Range   Lactic Acid, Venous 1.39 0.5 - 1.9 mmol/L  Urinalysis, Routine w reflex microscopic     Status: Abnormal   Collection Time: 07/27/16 12:41 PM  Result Value Ref Range   Color, Urine YELLOW YELLOW   APPearance CLEAR CLEAR   Specific Gravity, Urine 1.023 1.005 - 1.030   pH 6.0 5.0 - 8.0   Glucose, UA NEGATIVE NEGATIVE mg/dL   Hgb urine dipstick NEGATIVE NEGATIVE   Bilirubin Urine NEGATIVE NEGATIVE   Ketones, ur >80 (A) NEGATIVE mg/dL   Protein, ur NEGATIVE NEGATIVE mg/dL   Nitrite POSITIVE (A) NEGATIVE   Leukocytes, UA SMALL (A) NEGATIVE  Urine microscopic-add on     Status: Abnormal   Collection Time: 07/27/16 12:41 PM  Result Value Ref Range   Squamous Epithelial / LPF 0-5 (A) NONE SEEN   WBC, UA 6-30 0 - 5 WBC/hpf   RBC / HPF 0-5 0 - 5 RBC/hpf   Bacteria, UA MANY (A) NONE SEEN   Urine-Other MUCOUS PRESENT   I-Stat CG4 Lactic Acid, ED     Status:  None   Collection Time: 07/27/16  1:26 PM  Result Value Ref Range   Lactic Acid, Venous 1.37 0.5 - 1.9 mmol/L    Dg Chest 2 View  Result Date: 07/27/2016 CLINICAL DATA:  Pt poor historian, states she's unsure why she's here. Currently offers no complaints, denies pain, fever. Hx of diabetes, skin ca, hiatal hernia, bronchitis, HTN, seizures, SVT. Non-Smoker. EXAM: CHEST  2 VIEW COMPARISON:  04/01/2016 FINDINGS: Cardiac silhouette is normal in size and configuration. No mediastinal or hilar masses or evidence of adenopathy. Lungs are clear.  No pleural effusion or pneumothorax. Bony thorax is demineralized. There has been anterior cervical spine fusion since the prior exam. IMPRESSION: No active cardiopulmonary disease. Electronically Signed   By: Lajean Manes M.D.   On: 07/27/2016 10:42    Review of Systems  Unable to perform ROS: Mental acuity   Blood pressure 153/81, pulse 104,  temperature 98.8 F (37.1 C), temperature source Rectal, resp. rate 22, SpO2 100 %. Physical Exam  Constitutional: She appears well-developed and well-nourished.  HENT:  Head: Normocephalic.  Eyes: Pupils are equal, round, and reactive to light.  Neck: Normal range of motion.  Respiratory: Effort normal.  GI: Soft. Bowel sounds are normal.  Neurological: She is alert. She has normal strength. GCS eye subscore is 4. GCS verbal subscore is 5. GCS motor subscore is 6.  Patient's awake and alert she is somewhat confused her short-term memory is significantly impaired she does not remember really why she is here for what happened to her except that she was found on the floor by her family and her son. She has full strength 5 out of 5 upper and lower extremities her incisions clean dry and intact.    Assessment/Plan: 65 year old female 1 week out from an ACDF is been convalescing well at home however was found down and is now brought to the ER. I suspect that most of this is medication effect that she was in charge  of her a medication not taking it properly. She has a little bit of difficulty with mental status as a baseline and I think that continued workup to rule out an organic cause should be undertaken although I think the urinalysis was fairly underwhelming for UTI. The patient more likely will need to be controlled on very limited pain control medication may be Tylenol only for a while try to get her less confused and then work on placing her to a skilled nursing facility as the patient probably not safe enough to be independent home this point. I discussed with her son Venancio Poisson who did state the primary purpose for bringing her and left a found her down was that they didn't feel like they can take care of her at home.  Rehanna Oloughlin P 07/27/2016, 4:00 PM

## 2016-07-28 DIAGNOSIS — R410 Disorientation, unspecified: Secondary | ICD-10-CM

## 2016-07-28 DIAGNOSIS — M47896 Other spondylosis, lumbar region: Secondary | ICD-10-CM

## 2016-07-28 DIAGNOSIS — K219 Gastro-esophageal reflux disease without esophagitis: Secondary | ICD-10-CM

## 2016-07-28 DIAGNOSIS — E1110 Type 2 diabetes mellitus with ketoacidosis without coma: Secondary | ICD-10-CM

## 2016-07-28 DIAGNOSIS — F329 Major depressive disorder, single episode, unspecified: Secondary | ICD-10-CM

## 2016-07-28 DIAGNOSIS — Z87440 Personal history of urinary (tract) infections: Secondary | ICD-10-CM

## 2016-07-28 DIAGNOSIS — Z66 Do not resuscitate: Secondary | ICD-10-CM

## 2016-07-28 DIAGNOSIS — M47812 Spondylosis without myelopathy or radiculopathy, cervical region: Secondary | ICD-10-CM

## 2016-07-28 DIAGNOSIS — N390 Urinary tract infection, site not specified: Principal | ICD-10-CM

## 2016-07-28 DIAGNOSIS — R4182 Altered mental status, unspecified: Secondary | ICD-10-CM | POA: Diagnosis present

## 2016-07-28 DIAGNOSIS — Z981 Arthrodesis status: Secondary | ICD-10-CM

## 2016-07-28 DIAGNOSIS — I10 Essential (primary) hypertension: Secondary | ICD-10-CM

## 2016-07-28 DIAGNOSIS — B9689 Other specified bacterial agents as the cause of diseases classified elsewhere: Secondary | ICD-10-CM

## 2016-07-28 LAB — CBC
HEMATOCRIT: 42 % (ref 36.0–46.0)
HEMOGLOBIN: 13.5 g/dL (ref 12.0–15.0)
MCH: 27.7 pg (ref 26.0–34.0)
MCHC: 32.1 g/dL (ref 30.0–36.0)
MCV: 86.1 fL (ref 78.0–100.0)
Platelets: 251 10*3/uL (ref 150–400)
RBC: 4.88 MIL/uL (ref 3.87–5.11)
RDW: 14 % (ref 11.5–15.5)
WBC: 7.6 10*3/uL (ref 4.0–10.5)

## 2016-07-28 LAB — BASIC METABOLIC PANEL
ANION GAP: 7 (ref 5–15)
BUN: 10 mg/dL (ref 6–20)
CALCIUM: 9.4 mg/dL (ref 8.9–10.3)
CO2: 24 mmol/L (ref 22–32)
Chloride: 108 mmol/L (ref 101–111)
Creatinine, Ser: 0.93 mg/dL (ref 0.44–1.00)
GLUCOSE: 106 mg/dL — AB (ref 65–99)
POTASSIUM: 4.2 mmol/L (ref 3.5–5.1)
Sodium: 139 mmol/L (ref 135–145)

## 2016-07-28 LAB — GLUCOSE, CAPILLARY
GLUCOSE-CAPILLARY: 90 mg/dL (ref 65–99)
Glucose-Capillary: 89 mg/dL (ref 65–99)

## 2016-07-28 LAB — URINE CULTURE

## 2016-07-28 MED ORDER — ENOXAPARIN SODIUM 30 MG/0.3ML ~~LOC~~ SOLN
30.0000 mg | SUBCUTANEOUS | Status: DC
  Administered 2016-07-28: 30 mg via SUBCUTANEOUS
  Filled 2016-07-28: qty 0.3

## 2016-07-28 MED ORDER — ACETAMINOPHEN 500 MG PO TABS: 1000.0000 mg | ORAL_TABLET | Freq: Four times a day (QID) | ORAL | 0 refills | Status: AC | PRN

## 2016-07-28 MED ORDER — ACETAMINOPHEN 500 MG PO TABS: 1000.0000 mg | ORAL_TABLET | Freq: Four times a day (QID) | ORAL | Status: DC | PRN

## 2016-07-28 MED ORDER — KETOROLAC TROMETHAMINE 15 MG/ML IJ SOLN
15.0000 mg | Freq: Once | INTRAMUSCULAR | Status: AC
  Administered 2016-07-28: 15 mg via INTRAVENOUS
  Filled 2016-07-28: qty 1

## 2016-07-28 NOTE — Progress Notes (Signed)
K pad applied per MD order.

## 2016-07-28 NOTE — Care Management Note (Signed)
Case Management Note  Patient Details  Name: KRISANNE LICH MRN: [id number] Date of Birth: 09-Aug-1951  Subjective/Objective:      CM following for progression and d/c planning.               Action/Plan: 07/28/2016 Met with pt re d/c need. No DME needs, pt states that she has used [facility] services in the past and wishes to use that agency again. This CM notified [facility] re pt selection and plan to d/c this pt today.  Expected Discharge Date:   07/28/2016               Expected Discharge Plan:  [place]  In-House Referral:  NA  Discharge planning Services  CM Consult  Post Acute Care Choice:  Home Health Choice offered to:  Patient  DME Arranged:  N/A DME Agency:   NA  HH Arranged:  RN, PT, OT, Nurse's Aide [place] Agency:  [facility]  Status of Service:  Completed, signed off  If discussed at [place] of Stay Meetings, dates discussed:    Additional Comments:  Adron Bene, RN 07/28/2016, 2:48 PM

## 2016-07-28 NOTE — Evaluation (Signed)
Occupational Therapy Evaluation Patient Details Name: [name] [name] MRN: [id number] DOB: 28-Aug-1951 Today's Date: 07/28/2016    History of Present Illness Admitted after found on floor at home; UTI and questionable ability to manage her medications post recent neck surgery   Clinical Impression   This 65 year old female was admitted for the above. She is able to perform adls with set up/supervision.  Recommend continued OT in acute setting and home health as she lives alone.  Goals in acute are for supervision to mod I levels. She needs set up/supervision at this time    Follow Up Recommendations  Home health OT;Supervision/Assistance - 24 hour (initially, if possible or as much supervision as possible)    Equipment Recommendations  None recommended by OT    Recommendations for Other Services       Precautions / Restrictions Precautions Precautions: None Precaution Comments: Noted on 8/22 her soft cervical collar was for comfort only Required Braces or Orthoses: Cervical Brace Cervical Brace: For comfort;Soft collar Restrictions Weight Bearing Restrictions: No      Mobility Bed Mobility               General bed mobility comments: oob  Transfers Overall transfer level: Modified independent Equipment used: None             General transfer comment: pushed up with assist from UEs; otherwise no difficulty with rise    Balance Overall balance assessment: Needs assistance           Standing balance-Leahy Scale: Fair                              ADL Overall ADL's : Needs assistance/impaired     Grooming: Set up;Sitting   Upper Body Bathing: Set up;Sitting   Lower Body Bathing: Set up;Sit to/from stand   Upper Body Dressing : Set up;Sitting   Lower Body Dressing: Set up;Sit to/from stand                 General ADL Comments: Pt plans d/c later today.  Got dressed during OT session.  Pt is able to cross legs for adls;  won't need to use AE.  Educated on toilet aide as pt has difficulty with hygiene due to back pain and short arms.  Also educated on energy conservation.  Recommended that pt not shower when no one is in the home     Vision     Perception     Praxis      Pertinent Vitals/Pain Pain Assessment: Faces Pain Score: 5  Faces Pain Scale: Hurts little more Pain Location: back Pain Descriptors / Indicators: Aching Pain Intervention(s): Limited activity within patient's tolerance;Monitored during session;Repositioned     Hand Dominance Right   Extremity/Trunk Assessment Upper Extremity Assessment Upper Extremity Assessment: Overall WFL for tasks assessed (during ADLs.  MMT deferred due to recent neck sx)          Communication Communication Communication: No difficulties   Cognition Arousal/Alertness: Awake/alert Behavior During Therapy: WFL for tasks assessed/performed Overall Cognitive Status: Impaired/Different from baseline Area of Impairment: Memory;Safety/judgement     Memory: Decreased short-term memory   Safety/Judgement: Decreased awareness of safety     General Comments: Pt is unsure about when she wears collar.  had difficulty with some of the PLOF questions.   General Comments       Exercises       Shoulder Instructions  Home Living Family/patient expects to be discharged to:: Private residence Living Arrangements: Alone Available Help at Discharge: Family;Available PRN/intermittently Type of Home: House Home Access: Ramped entrance     Home Layout: One level     Bathroom Shower/Tub: Walk-in Hydrologist: Standard     Home Equipment: Environmental consultant - 2 wheels;Cane - single point;Bedside commode;Adaptive equipment;Shower seat;Hand held shower head;Other (comment);Grab bars - toilet;Grab bars - tub/shower Adaptive Equipment: Sock aid;Reacher Additional Comments: Has equipment from prior surgeries (back and B knees).      Prior  Functioning/Environment Level of Independence: Independent        Comments: was independent prior to neck surgery; difficulty managing postop at home as she was found on her floor    OT Diagnosis: Acute pain   OT Problem List: Decreased cognition;Pain;Decreased activity tolerance   OT Treatment/Interventions: Self-care/ADL training;Cognitive remediation/compensation;Patient/family education    OT Goals(Current goals can be found in the care plan section) Acute Rehab OT Goals Patient Stated Goal: home today to cats OT Goal Formulation: With patient Time For Goal Achievement: 08/04/16 ADL Goals Pt Will Transfer to Toilet: Independently;ambulating;regular height toilet;grab bars Pt Will Perform Tub/Shower Transfer: Shower transfer;with supervision;ambulating;shower seat;grab bars Additional ADL Goal #1: pt will perform adl, including clothing retrieval at mod I level  OT Frequency: Min 2X/week   Barriers to D/C:            Co-evaluation              End of Session    Activity Tolerance: Patient tolerated treatment well Patient left: in chair;with call bell/phone within reach   Time: 1512-1529 OT Time Calculation (min): 17 min Charges:  OT General Charges $OT Visit: 1 Procedure OT Evaluation $OT Eval Low Complexity: 1 Procedure G-Codes:    [name] [name] 08-25-2016, 3:54 PM  Lesle Chris, OTR/L [phone] 08-25-2016

## 2016-07-28 NOTE — Progress Notes (Signed)
Pt. reported no pain relief after giving Oxycodone- Acetaminophen 5-325 mg. Paged doctor Wynetta Emery, Ketoralac 15 mg/ml  has been ordered and given. Patient asleep.   Cherita Hebel, RN

## 2016-07-28 NOTE — Discharge Summary (Signed)
Name: [name] [name] MRN: [id number] DOB: 1951/06/18 65 y.o. PCP: Charleston Poot, MD  Date of Admission: 07/27/2016 10:03 AM Date of Discharge: 07/28/2016 Attending Physician: Annia Belt, MD  Discharge Diagnosis: Active Problems:   UTI (lower urinary tract infection)   Altered mental status   Discharge Medications:   Medication List    STOP taking these medications   loperamide 2 MG capsule Commonly known as:  IMODIUM     TAKE these medications   acetaminophen 500 MG tablet Commonly known as:  TYLENOL Take 2 tablets (1,000 mg total) by mouth every 6 (six) hours as needed for moderate pain.   bumetanide 1 MG tablet Commonly known as:  BUMEX Take 0.5-1 mg by mouth 2 (two) times daily. 1 tablet in the AM, 0.5 tablet in the PM.   calcium carbonate 500 MG chewable tablet Commonly known as:  TUMS - dosed in mg elemental calcium Chew 1 tablet by mouth daily as needed for indigestion or heartburn.   calcium-vitamin D 500-200 MG-UNIT tablet Commonly known as:  OSCAL WITH D Take 1 tablet by mouth daily with breakfast.   conjugated estrogens vaginal cream Commonly known as:  PREMARIN Place 1 Applicatorful vaginally as directed. Twice a week.   doxepin 25 MG capsule Commonly known as:  SINEQUAN Take 25 mg by mouth 3 (three) times daily as needed (itching).   DULoxetine 60 MG capsule Commonly known as:  CYMBALTA Take 60 mg by mouth 2 (two) times daily.   gabapentin 300 MG capsule Commonly known as:  NEURONTIN Take 600 mg by mouth 3 (three) times daily.   magnesium oxide 400 MG tablet Commonly known as:  MAG-OX Take 400 mg by mouth daily.   metFORMIN 500 MG tablet Commonly known as:  GLUCOPHAGE Take 500 mg by mouth at bedtime.   metoprolol succinate 50 MG 24 hr tablet Commonly known as:  TOPROL-XL Take 50 mg by mouth 2 (two) times daily.   metroNIDAZOLE 1 % gel Commonly known as:  METROGEL Apply 1 application topically 2 (two) times daily.     multivitamin with minerals Tabs tablet Take 1 tablet by mouth 2 (two) times daily.   ondansetron 8 MG tablet Commonly known as:  ZOFRAN Take 8 mg by mouth every 8 (eight) hours as needed for nausea or vomiting.   oxyCODONE-acetaminophen 5-325 MG tablet Commonly known as:  PERCOCET/ROXICET Take 1 tablet by mouth every 6 (six) hours as needed for moderate pain. What changed:  Another medication with the same name was removed. Continue taking this medication, and follow the directions you see here.   pantoprazole 40 MG tablet Commonly known as:  PROTONIX Take 40 mg by mouth daily.   polyethylene glycol packet Commonly known as:  MIRALAX / GLYCOLAX Take 17 g by mouth daily.   potassium chloride 10 MEQ CR capsule Commonly known as:  MICRO-K Take 10 mEq by mouth 2 (two) times daily.   solifenacin 5 MG tablet Commonly known as:  VESICARE Take 10 mg by mouth daily.   spironolactone 50 MG tablet Commonly known as:  ALDACTONE Take 50 mg by mouth 2 (two) times daily.   triamcinolone cream 0.1 % Commonly known as:  KENALOG Apply 1 application topically 2 (two) times daily as needed (affected area).      Disposition and follow-up:   [name] was [facility] in Good condition.  At the hospital follow up visit please address:  UTI: Compliance w/ antibiotic therapy?  Did she have problems picking up her antibiotic and finishing the complete course of therapy? Symptoms of dysuria? Mental status remained clear? AMS: Compliance with pain medications for neck? Problems with recurrent confusion?  Follow-up Appointments: Follow-up Information    Charleston Poot, MD. Schedule an appointment as soon as possible for a visit in 2 week(s).   Specialty:  Internal Medicine Contact information: [address] [place] [address] [phone]          Hospital Course by problem list: Active Problems:   UTI (lower urinary tract  infection)   Altered mental status   1. Altered mental status, multifactorial 2/2 UTI and narcotic medications: Patient presented to the emergency room significantly confused and agitated. She was noted to have a urinary tract infection with a dirty UA and a mildly elevated white blood cell count. She was treated with antibiotics and urine cultures were drawn. Her confusion was thought to be secondary to urinary tract infection. Patient notes that she has had these reactions to UTIs in the past. Most recently she had a similar reaction to UTIs in [place]. However patient was also treated with additional dose of Dilaudid on Saturday night in addition to her regularly scheduled Percocet which she been taking status post cervical discectomy and fusion on 821. Patient was found down by her sons and her narcotic pain regimen may have been contributing to her confusion. Patient had a unremarkable head CT showing no signs of traumatic brain bleed. Patient was initially tachycardic, but had stable blood pressures and was afebrile. Patient's white blood cell count continued to improve her vital signs completely normalized. Her confusion resolved overnight. The patient was discharged after 2 doses of ceftriaxone therapy. We have planned to follow up urine culture results when available and appropriate antibiotic will be called into the patient's outpatient pharmacy for the completion of her UTI therapy. Patient was also counseled on appropriate use of her narcotic pain medication for her neck pain in the postop period. She was discharged with referral to home health for physical therapy rehabilitation as well as for home health aide to help with medication compliance.  Discharge Vitals:   BP 133/68 (BP Location: Left Arm)   Pulse 69   Temp 98.4 F (36.9 C) (Oral)   Resp 16   Wt 184 lb 4.9 oz (83.6 kg)   SpO2 98%   BMI 34.82 kg/m   Pertinent Labs, Studies, and Procedures: As above.  Procedures Performed:    Dg Chest 2 View  Result Date: 07/27/2016 CLINICAL DATA:  Pt poor historian, states she's unsure why she's here. Currently offers no complaints, denies pain, fever. Hx of diabetes, skin ca, hiatal hernia, bronchitis, HTN, seizures, SVT. Non-Smoker. EXAM: CHEST  2 VIEW COMPARISON:  04/01/2016 FINDINGS: Cardiac silhouette is normal in size and configuration. No mediastinal or hilar masses or evidence of adenopathy. Lungs are clear.  No pleural effusion or pneumothorax. Bony thorax is demineralized. There has been anterior cervical spine fusion since the prior exam. IMPRESSION: No active cardiopulmonary disease. Electronically Signed   By: Lajean Manes M.D.   On: 07/27/2016 10:42   Ct Head Wo Contrast  Result Date: 07/27/2016 CLINICAL DATA:  Altered mental status. Neck pain following a fall this morning. Status post surgical fusion 2 days ago. EXAM: CT HEAD WITHOUT CONTRAST TECHNIQUE: Contiguous axial images were obtained from the base of the skull through the vertex without intravenous contrast. COMPARISON:  Brain MR dated 01/13/2016 and head CT dated 12/27/2015.  FINDINGS: Brain: Normal appearing cerebral hemispheres and posterior fossa structures. Normal size and position of the ventricles. No intracranial hemorrhage, mass lesion or CT evidence of acute infarction. Vascular: No hyperdense vessel or unexpected calcification. Skull: No fractures. Sinuses/Orbits: No paranasal sinus air-fluid levels. Normal appearing orbital contents. Other: None. IMPRESSION: Normal examination. Electronically Signed   By: Claudie Revering M.D.   On: 07/27/2016 20:26   Discharge Instructions: Discharge Instructions    Call MD for:    Complete by:  As directed   Worsening confusion   Call MD for:  persistant dizziness or light-headedness    Complete by:  As directed   Call MD for:  temperature >100.4    Complete by:  As directed   Diet - low sodium heart healthy    Complete by:  As directed   Discharge instructions     Complete by:  As directed   I think that your confusion may have been due to several factors. First you had a urinary tract infection which we have been treating with antibiotics. You then 2 doses of antibiotics here in the hospital. We will plan to send in a prescription for another antibiotic which she can take by mouth. We will await the final results of the urine culture in order to prescribe the appropriate antibiotic tomorrow. When these results come back we will call you and send in a prescription to your local pharmacy. It will be important to pick up this medication tomorrow and to finish taking the entire course of this antibiotic in order to make sure this infection does not return.  Secondly, we think that your confusion may have been due in part to and overuse of the pain medications. Patient is notorious for causing confusion and you have been taking several of these medications since her surgery last week. Would encourage you not to overuse these medications and be careful to always take 1 Percocet pill every 6 hours when you needed for pain. You may take this medication less frequently but do not take it more frequently. If she continued to have pain in the interval between doses of Percocet you may take Tylenol or ibuprofen.  In order to help with your medications at home and in order to help rehabilitate you after your surgery we will arrange for home physical therapy and for home health aide to come to your house. It will also be important that you follow-up with your primary care doctor after your hospitalization.   Increase activity slowly    Complete by:  As directed     Signed: Holley Raring, MD 07/28/2016, 2:37 PM   Pager: [phone]

## 2016-07-28 NOTE — Progress Notes (Signed)
Subjective: Currently, the patient is feeling much better. MS has improved. C/o some back pain around trapezius. She was given Toradol ON with minimal relief. Denies any other complaints.  Interval Events: Back pain.  Objective: Vital signs in last 24 hours: Vitals:   07/27/16 1400 07/27/16 1700 07/27/16 2001 07/28/16 0533  BP: 153/81 (!) 150/78 (!) 167/77 (!) 147/47  Pulse: 104 (!) 105 (!) 110 80  Resp: 22 (!) 200 20 18  Temp:  98.8 F (37.1 C) 98.3 F (36.8 C) 98.6 F (37 C)  TempSrc:  Oral Oral Oral  SpO2: 100% 100% 100% 100%  Weight:  186 lb 8.2 oz (84.6 kg) 184 lb 4.9 oz (83.6 kg)    Physical Exam: Physical Exam  Constitutional: She is oriented to person, place, and time. She appears well-developed. She is cooperative. No distress.  Obese  HENT:  Mouth/Throat: Oropharynx is clear and moist. No oropharyngeal exudate.  Eyes: Conjunctivae are normal. Pupils are equal, round, and reactive to light.  Neck: Neck supple. Muscular tenderness (around trapezius bilaterally) present. Decreased range of motion present.  Well healing anterior cervical surgical scar with old serosanguinous drainage. Clean dry and intact on exam.  Cardiovascular: Normal rate, regular rhythm, normal heart sounds and normal pulses.  Exam reveals no gallop.   No murmur heard. Pulmonary/Chest: Effort normal and breath sounds normal. No respiratory distress. Breasts are symmetrical.  Abdominal: Soft. Bowel sounds are normal. There is no tenderness.  Musculoskeletal: She exhibits edema (trace).  Neurological: She is alert and oriented to person, place, and time. Coordination normal.   Labs: CBC:  Recent Labs Lab 07/27/16 1020 07/28/16 0929  WBC 10.7* 7.6  NEUTROABS 8.2*  --   HGB 13.9 13.5  HCT 43.5 42.0  MCV 85.5 86.1  PLT 123456 [id number]   Metabolic Panel:  Recent Labs Lab 07/27/16 1020 07/28/16 0929  NA 138 139  K 3.8 4.2  CL 105 108  CO2 17* 24  GLUCOSE 96 106*  BUN 10 10  CREATININE  0.92 0.93  CALCIUM 9.6 9.4  ALT 25  --   ALKPHOS 102  --   BILITOT 1.1  --   PROT 6.7  --   ALBUMIN 3.8  --   BG:  Recent Labs Lab 07/21/16 1302 07/21/16 1755 07/21/16 2033 07/27/16 1721 07/27/16 2000  GLUCAP 81 129* 157* 72 102*   Lab Results  Component Value Date   HGBA1C 6.3 (H) 07/15/2016   Microbiology: UCx - pending BCx - NGx1d  Imaging: Head CT - negative   Medications:   Scheduled Medications: . cefTRIAXone (ROCEPHIN)  IV  1 g Intravenous Q24H  . DULoxetine  60 mg Oral BID  . gabapentin  600 mg Oral TID  . insulin aspart  0-9 Units Subcutaneous TID WC  . metoprolol succinate  50 mg Oral BID  . pantoprazole  40 mg Oral Daily  . polyethylene glycol  17 g Oral Daily   PRN Medications: acetaminophen, oxyCODONE-acetaminophen  Assessment/Plan: Pt is a 65 y.o. yo female with a PMHx of HTN, DM, GERD, cervical discectomy/fusion who was admitted on 07/27/2016 with symptoms of confusion, which was determined to be secondary to infection vs narcotic overuse.  1) AMS: Improved. CT negative. Has some baseline cognitive decline, but feels like she is at her baseline. Sons have concerns about her returning home w/o supervision. Will have PT/OT eval.  2) Sepsis: presumed 2/2 UTI w/ leukocytosis and dirty urine. WBC improved. Afebrile. AMS may be worsened by fluoroquinolone. -  cont Ceftriaxone - f/u UCx and BCx, narrow Abx as appropriate - CBC/BMP in AM  3) s/p cervical discectomy/fusion: recent surgery on 07/20/16 w/ Neurosurgery. Well healing ant cervical wound, no signs of site infection. Mild TTP. - percocet 5-325mg  q6h - gabapentin 600mg  TID - Toradol 15mg  PRN  4) HTN: Continue home meds Metoprolol 50mg  BID. 5) GERD: Continue home pantoprazole 40mg  qD 6) Depression: Continue home duloxetine 60mg  BID  Length of Stay: 1 day(s) Dispo: Anticipated discharge in approximately 1-2 day(s), will need to establish safe disposition as pt was found down at home and may  need 24hr supervision.  Holley Raring, MD Pager: [phone] (7AM-5PM) 07/28/2016, 6:57 AM

## 2016-07-28 NOTE — Progress Notes (Addendum)
Pt. continues to complain of upper back pain 7/10. Paged doctor Wynetta Emery for second notification. Ketoralac 15 mg/ml and Acetaminophen 1000 mg has been ordered. Ketoralac has been given. Will keep monitor.   Nikhil Osei, RN

## 2016-07-28 NOTE — Evaluation (Signed)
Physical Therapy Evaluation Patient Details Name: [name] [name] MRN: [id number] DOB: [birth year] Today's Date: 07/28/2016   History of Present Illness  Admitted after found on floor at home; UTI and questionable ability to manage her medications post recent neck surgery  Clinical Impression   Pt admitted with above diagnosis. Pt currently with functional limitations due to the deficits listed below (see PT Problem List). Overall her mobility/gait is good, with no gross loss of balance walking 200 ft without an assistive device; noted her sons have concerns about [name] [name] being home alone, and I share those concerns; Is it possible for her to stay with family, or her sons to arrange for staying with her?   Pt will benefit from skilled PT to increase their independence and safety with mobility to allow discharge to the venue listed below.  Will follow while in-house     Follow Up Recommendations Home health PT;Supervision/Assistance - 24 hour    Equipment Recommendations  None recommended by PT    Recommendations for Other Services OT consult (Reports difficulty with hygeine)     Precautions / Restrictions Precautions Precautions: None Precaution Comments: Noted on 8/22 her soft cervical collar was for comfort only      Mobility  Bed Mobility                  Transfers Overall transfer level: Modified independent Equipment used: None             General transfer comment: pushed up with assist from UEs; otherwise no difficulty with rise  Ambulation/Gait Ambulation/Gait assistance: Supervision Ambulation Distance (Feet): 200 Feet Assistive device: None Gait Pattern/deviations: Step-through pattern;Decreased stride length;Wide base of support     General Gait Details: Cues to self-monitor for activity tolerance  Stairs            Wheelchair Mobility    Modified Rankin (Stroke Patients Only)       Balance Overall balance assessment: Needs  assistance           Standing balance-Leahy Scale: Fair (will consider standardized balance assessment next session)                               Pertinent Vitals/Pain Pain Assessment: Faces Faces Pain Scale: Hurts little more Pain Location: upper back and shoulder pain which pt attributes to not getting up yesterday Pain Descriptors / Indicators: Aching Pain Intervention(s): Monitored during session;Other (comment) (K pad in room)    Home Living Family/patient expects to be discharged to:: Private residence Living Arrangements: Alone Available Help at Discharge: Family;Available PRN/intermittently Type of Home: House Home Access: Ramped entrance     Home Layout: One level Home Equipment: Walker - 2 wheels;Cane - single point;Bedside commode;Adaptive equipment;Shower seat;Hand held shower head;Other (comment) Additional Comments: Has equipment from prior surgeries (back and B knees).    Prior Function Level of Independence: Independent         Comments: was independent prior to neck surgery; difficulty managing postop at home as she was found on her floor     Hand Dominance   Dominant Hand: Right    Extremity/Trunk Assessment   Upper Extremity Assessment: Defer to OT evaluation           Lower Extremity Assessment: Overall WFL for tasks assessed         Communication   Communication: No difficulties  Cognition Arousal/Alertness: Awake/alert Behavior During Therapy: [name] for tasks assessed/performed  Overall Cognitive Status: Impaired/Different from baseline (though I am not quite sure of baseline) Area of Impairment: Memory;Safety/judgement     Memory: Decreased short-term memory   Safety/Judgement: Decreased awareness of safety     General Comments: Pt was unable to recall how she got to the hospital; She tells me she saw the ambulance and met the ambulance at the door to take her to the hospital; this is not consistent with her H&P,  which states her son found her on the floor    General Comments General comments (skin integrity, edema, etc.): Discussed case with SW and Case Mgr    Exercises        Assessment/Plan    PT Assessment Patient needs continued PT services  PT Diagnosis Acute pain;Difficulty walking   PT Problem List Decreased activity tolerance;Decreased balance;Decreased coordination;Decreased safety awareness;Decreased cognition;Pain  PT Treatment Interventions DME instruction;Gait training;Stair training;Functional mobility training;Therapeutic activities;Therapeutic exercise;Balance training;Cognitive remediation;Patient/family education   PT Goals (Current goals can be found in the Care Plan section) Acute Rehab PT Goals Patient Stated Goal: Plainly stated she does not want to go to an assisted living facility PT Goal Formulation: With patient Time For Goal Achievement: 08/04/16 Potential to Achieve Goals: Good    Frequency Min 3X/week   Barriers to discharge Decreased caregiver support Would like to see if family can pull together to give [name] [name] more assist in the home    Co-evaluation               End of Session Equipment Utilized During Treatment: Gait belt Activity Tolerance: Patient tolerated treatment well Patient left: in chair;with call bell/phone within reach (sittign by the sink preparing to wash up) Nurse Communication: Mobility status         Time: [phone] (End time is approximate) PT Time Calculation (min) (ACUTE ONLY): 21 min   Charges:   PT Evaluation $PT Eval Moderate Complexity: 1 Procedure     PT G CodesRoney Marion Hamff 07/28/2016, 1:09 PM  Roney Marion, [place]  Acute Rehabilitation Services Pager [phone] Office [phone]

## 2016-07-29 ENCOUNTER — Other Ambulatory Visit: Payer: Self-pay | Admitting: Internal Medicine

## 2016-07-29 MED ORDER — CEPHALEXIN 500 MG PO CAPS: 500.0000 mg | ORAL_CAPSULE | Freq: Two times a day (BID) | ORAL | 0 refills | Status: AC

## 2016-07-29 NOTE — Progress Notes (Signed)
Called and s/w pt regarding continuation of abx therapy for UTI. Urine culture grew <10,000 colonies and could not be speciated. We will continue empiric therapy for uncomplicated cystitis with Keflex 500mg  BID for a total fo 7 days (5 additional days). This medication was sent in to her pharmacy and the patient agrees to begin her first dose this evening.

## 2016-08-01 LAB — CULTURE, BLOOD (ROUTINE X 2)
Culture: NO GROWTH
Culture: NO GROWTH

## 2017-05-02 IMAGING — RF DG CERVICAL SPINE 2 OR 3 VIEWS
1 series · 2 of 2 positions shown · non-contrast
Comparison: Cervical spine CT April 01, 2016

FLUOROSCOPY TIME:  0 minutes 19 seconds; 2 submitted images

CLINICAL DATA: Status post anterior fusion from C4-C7

EXAM:
DG C-ARM 61-120 MIN; CERVICAL SPINE - 1 VIEW

[Series 1: run · 2 of 2 slices shown]
[im 1/2]
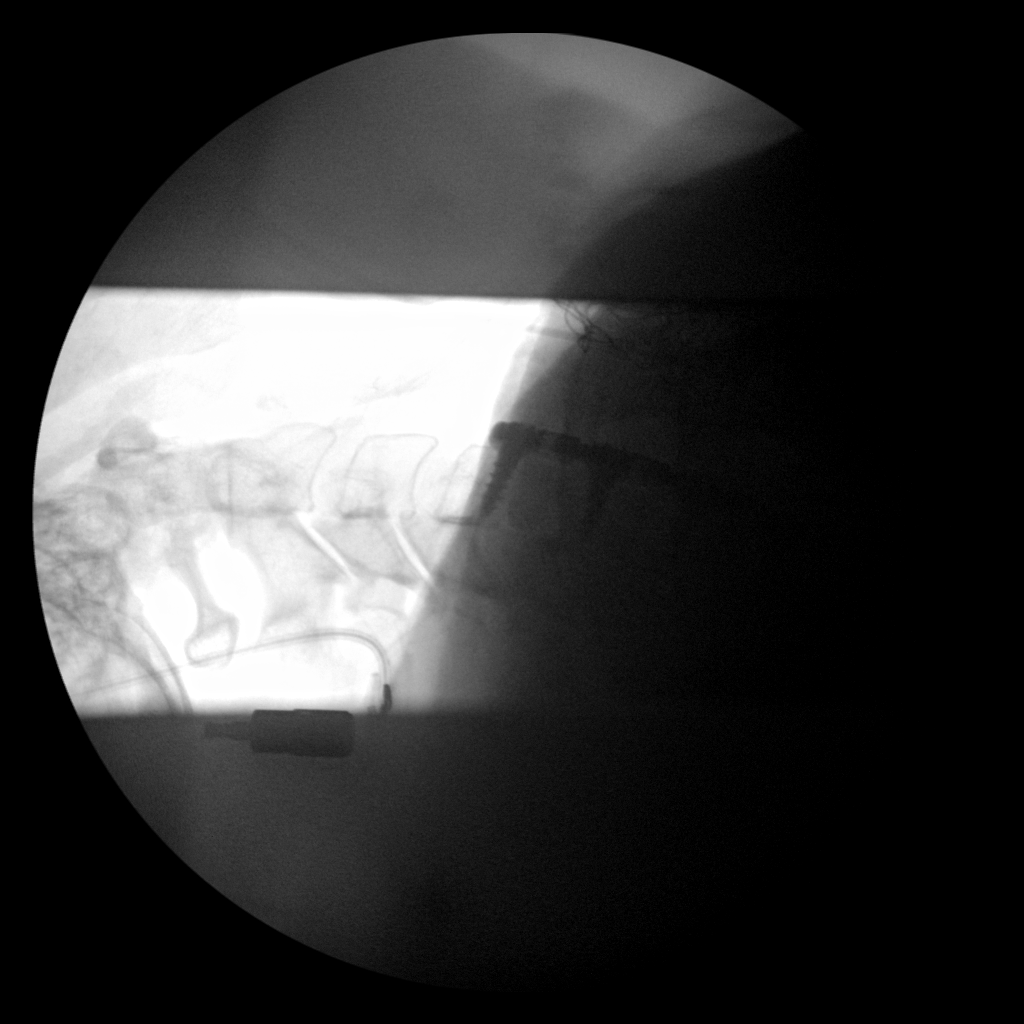
[im 2/2]
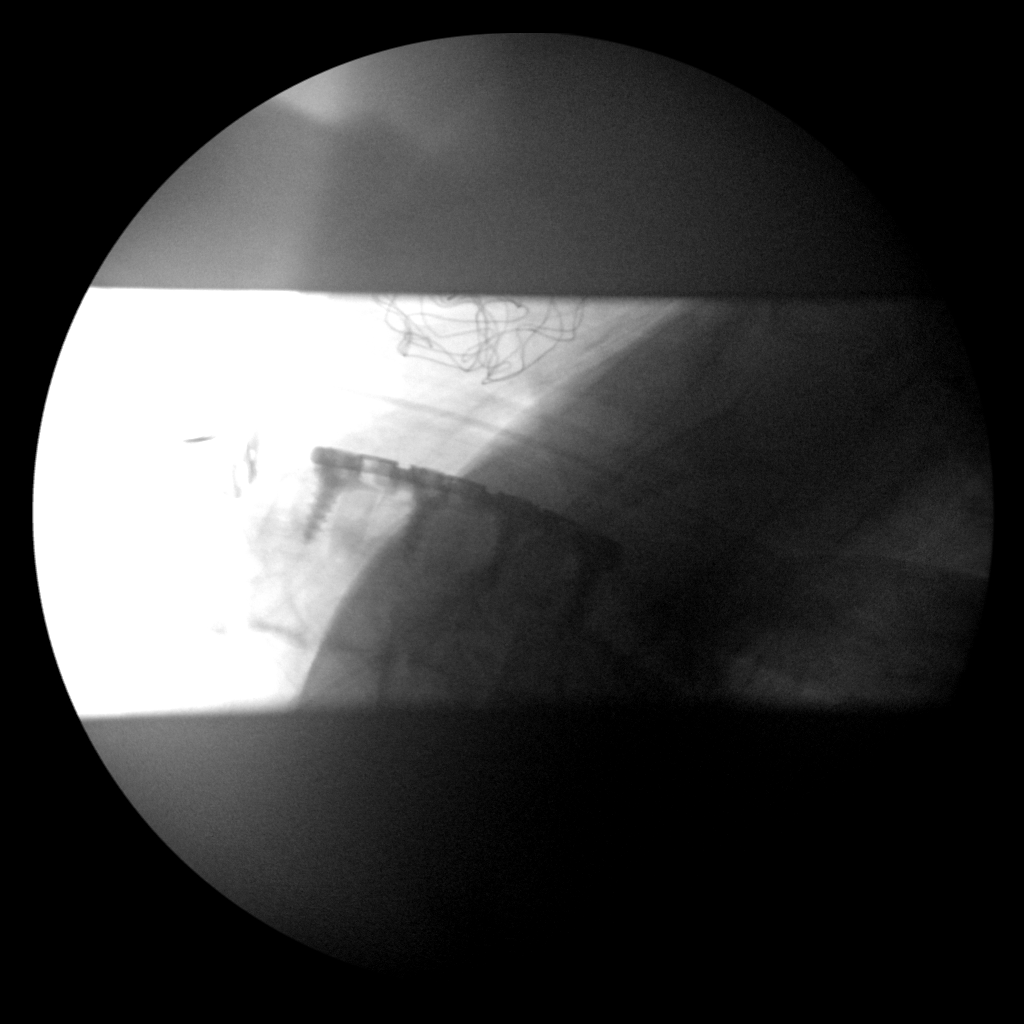

[2 of 2 positions shown; findings below may reference images not displayed]

FINDINGS: Cross-table lateral intraoperative images show anterior screw and
plate fixation from C4-C7. The screw and plate fixation device
appears intact on lateral view. No fracture or spondylolisthesis.
IMPRESSION: Status post anterior fusion from C4-C7 with the support hardware
appearing intact on lateral view. No fracture or spondylolisthesis
evident on lateral projection.

## 2017-05-09 IMAGING — CR DG CHEST 2V
2 series · 2 of 2 positions shown · non-contrast
Comparison: 04/01/2016

CLINICAL DATA: Pt poor historian, states she's unsure why she's
here. Currently offers no complaints, denies pain, fever. Hx of
diabetes, skin ca, hiatal hernia, bronchitis, HTN, seizures, SVT.
Non-Smoker.

EXAM:
CHEST  2 VIEW

[chest lat]
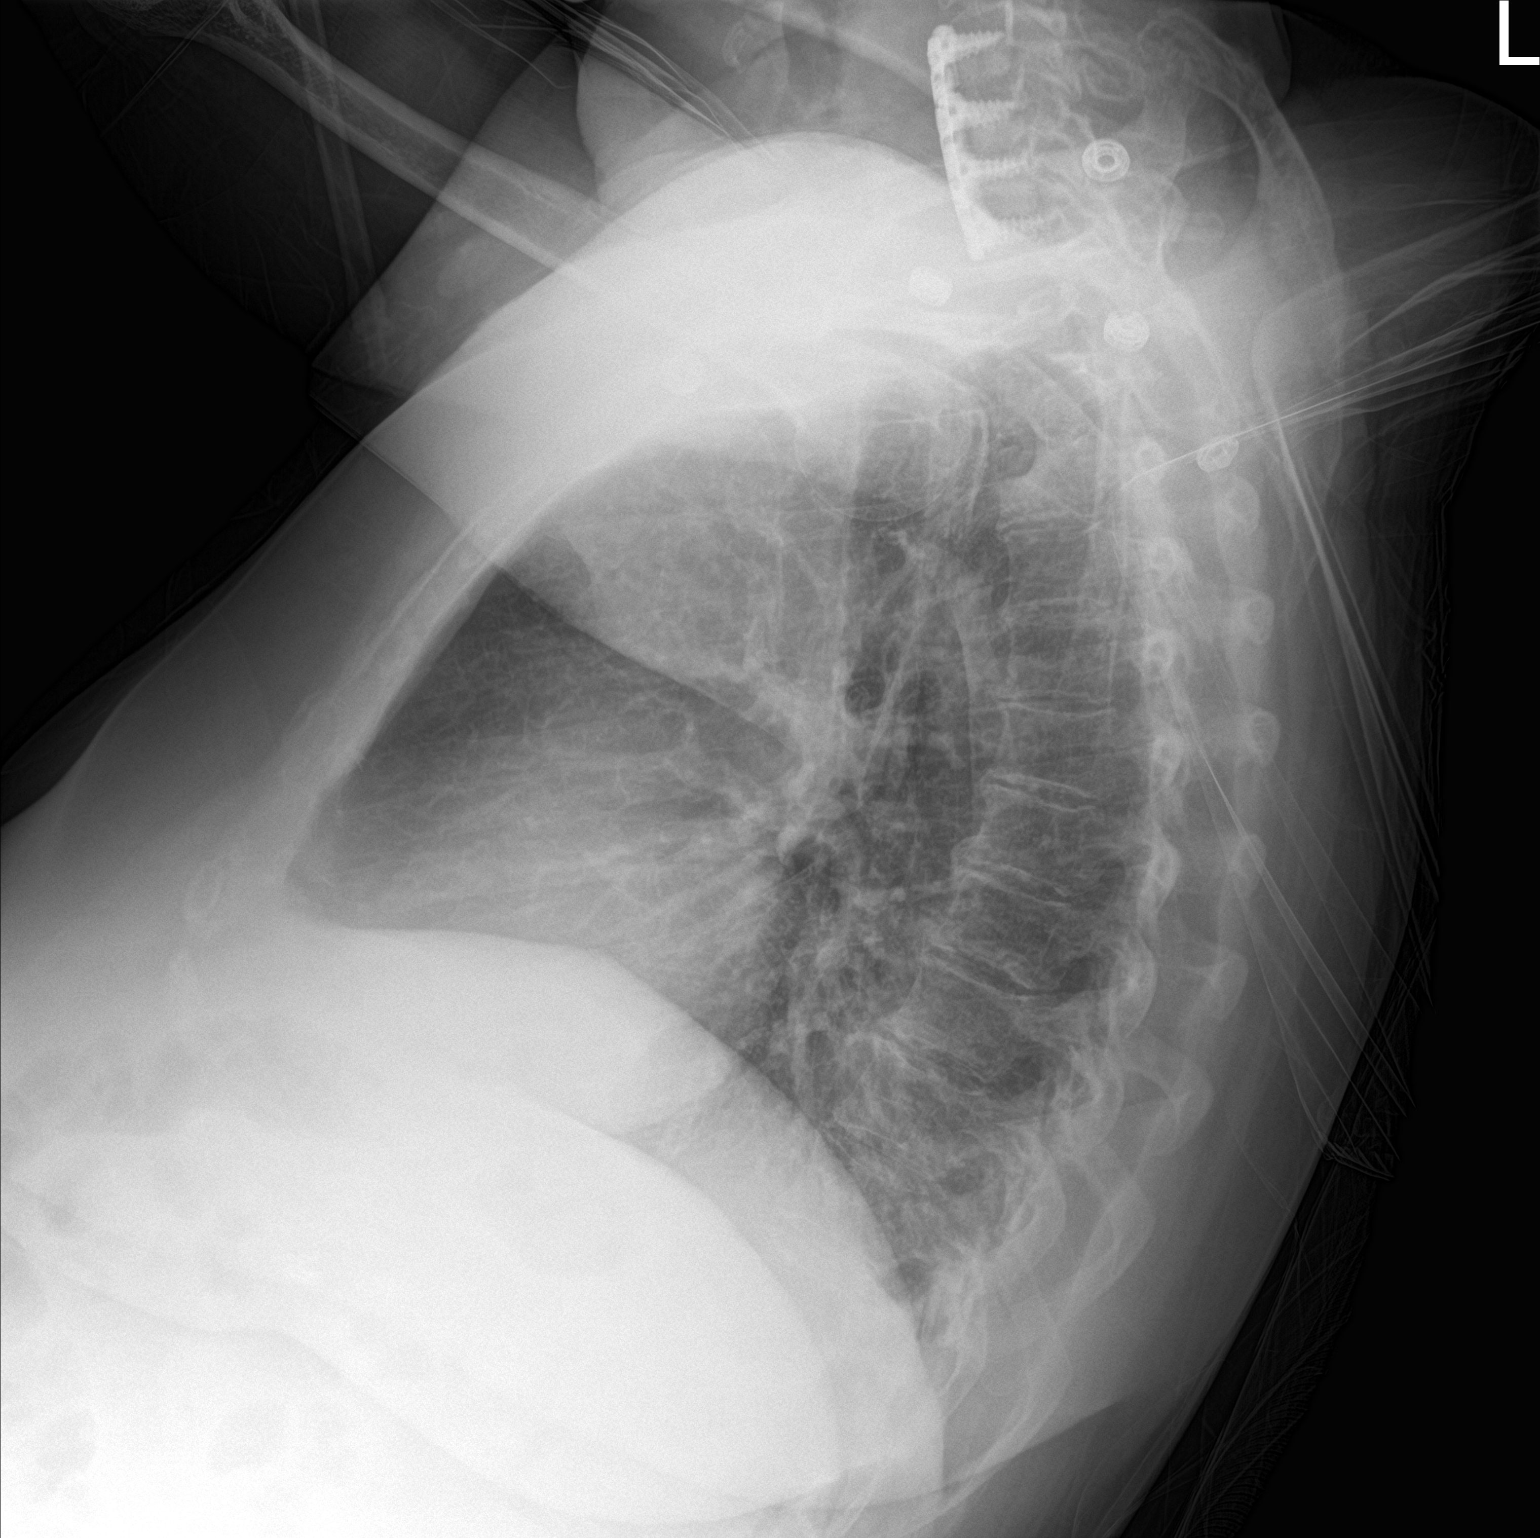

[chest ap]
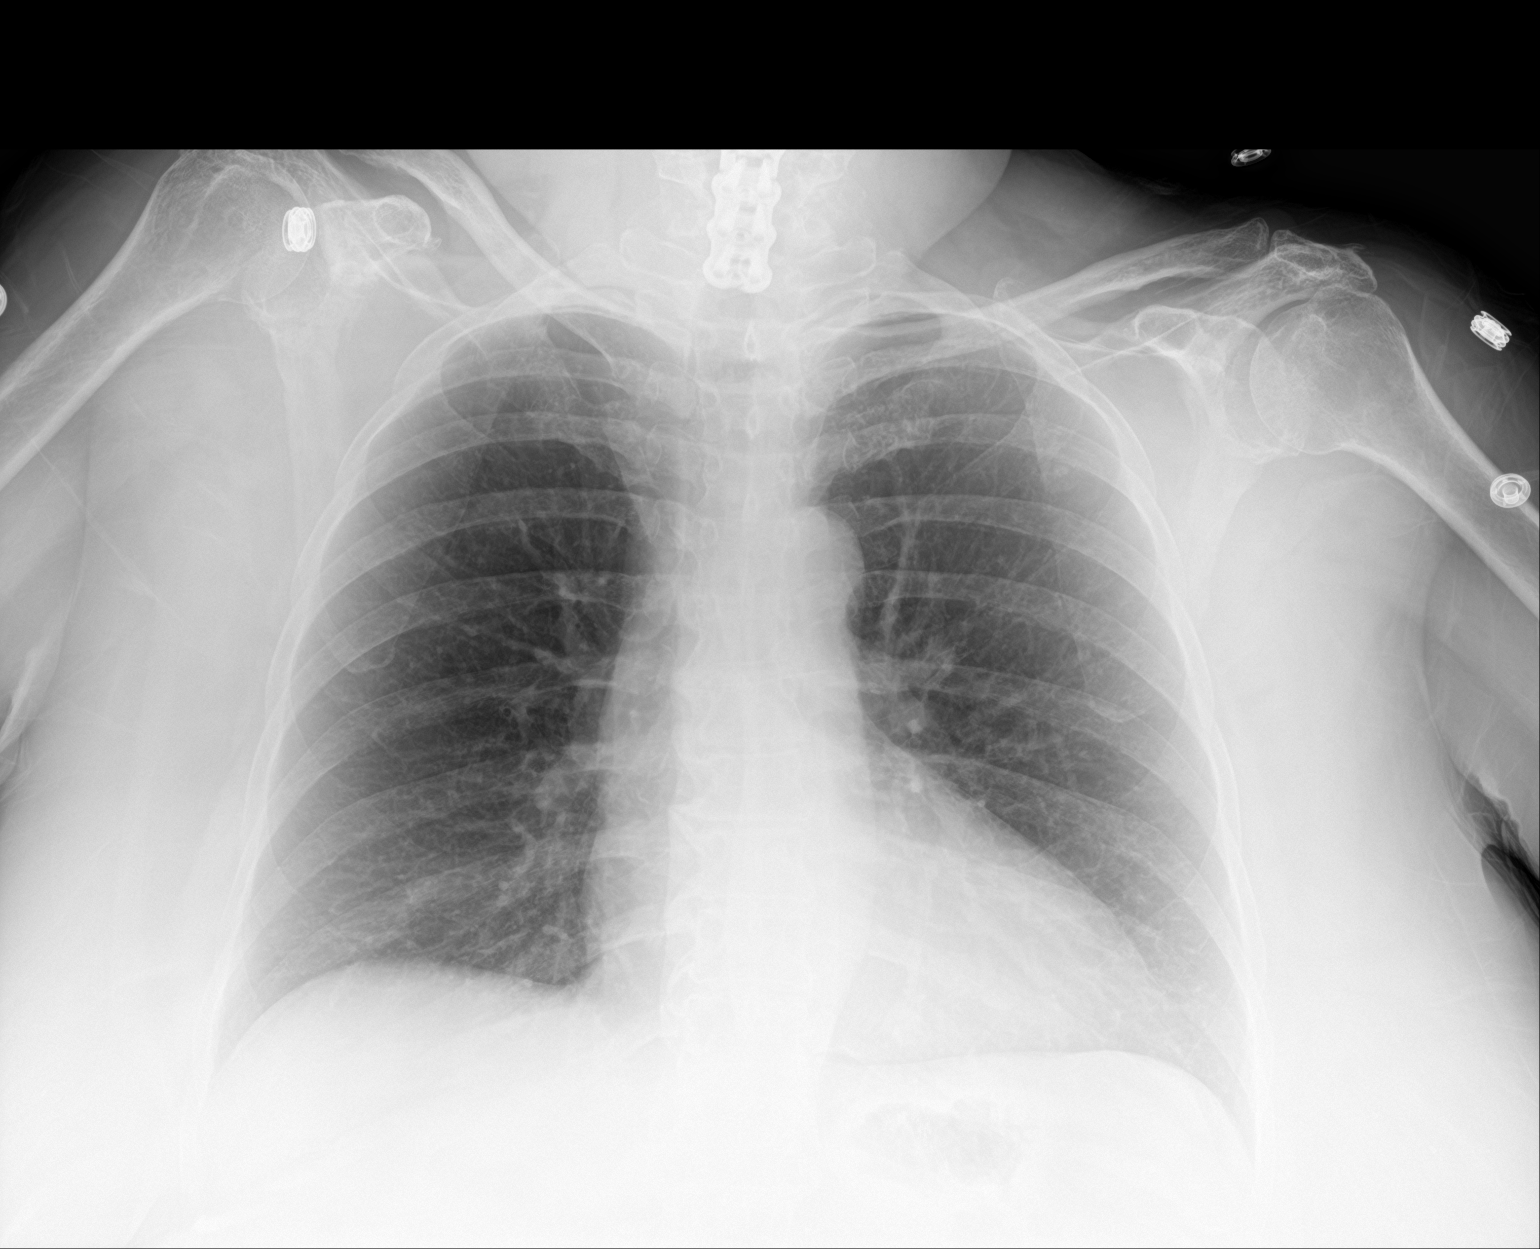

[2 of 2 positions shown; findings below may reference images not displayed]

FINDINGS: Cardiac silhouette is normal in size and configuration.

No mediastinal or hilar masses or evidence of adenopathy.

Lungs are clear.  No pleural effusion or pneumothorax.

Bony thorax is demineralized. There has been anterior cervical spine
fusion since the prior exam.
IMPRESSION: No active cardiopulmonary disease.

## 2017-10-27 ENCOUNTER — Emergency Department (HOSPITAL_BASED_OUTPATIENT_CLINIC_OR_DEPARTMENT_OTHER): Payer: Medicare Other

## 2017-10-27 ENCOUNTER — Emergency Department (HOSPITAL_BASED_OUTPATIENT_CLINIC_OR_DEPARTMENT_OTHER)
Admission: EM | Admit: 2017-10-27 | Discharge: 2017-10-27 | Disposition: A | Discharge status: Other Acute Inpatient Hospital | Payer: Medicare Other | Attending: Emergency Medicine | Admitting: Emergency Medicine

## 2017-10-27 ENCOUNTER — Other Ambulatory Visit: Payer: Self-pay

## 2017-10-27 ENCOUNTER — Encounter (HOSPITAL_BASED_OUTPATIENT_CLINIC_OR_DEPARTMENT_OTHER): Payer: Self-pay | Admitting: Emergency Medicine

## 2017-10-27 DIAGNOSIS — R1110 Vomiting, unspecified: Secondary | ICD-10-CM | POA: Diagnosis present

## 2017-10-27 DIAGNOSIS — E119 Type 2 diabetes mellitus without complications: Secondary | ICD-10-CM | POA: Insufficient documentation

## 2017-10-27 DIAGNOSIS — K56609 Unspecified intestinal obstruction, unspecified as to partial versus complete obstruction: Secondary | ICD-10-CM

## 2017-10-27 DIAGNOSIS — J449 Chronic obstructive pulmonary disease, unspecified: Secondary | ICD-10-CM | POA: Insufficient documentation

## 2017-10-27 DIAGNOSIS — I10 Essential (primary) hypertension: Secondary | ICD-10-CM | POA: Diagnosis not present

## 2017-10-27 DIAGNOSIS — Z7984 Long term (current) use of oral hypoglycemic drugs: Secondary | ICD-10-CM | POA: Insufficient documentation

## 2017-10-27 DIAGNOSIS — Z0189 Encounter for other specified special examinations: Secondary | ICD-10-CM

## 2017-10-27 DIAGNOSIS — K56699 Other intestinal obstruction unspecified as to partial versus complete obstruction: Secondary | ICD-10-CM | POA: Diagnosis not present

## 2017-10-27 LAB — CBC WITH DIFFERENTIAL/PLATELET
BASOS ABS: 0 10*3/uL (ref 0.0–0.1)
BASOS PCT: 0 %
Eosinophils Absolute: 0.1 10*3/uL (ref 0.0–0.7)
Eosinophils Relative: 1 %
HEMATOCRIT: 44.3 % (ref 36.0–46.0)
Hemoglobin: 14.7 g/dL (ref 12.0–15.0)
Lymphocytes Relative: 15 %
Lymphs Abs: 1.7 10*3/uL (ref 0.7–4.0)
MCH: 27.5 pg (ref 26.0–34.0)
MCHC: 33.2 g/dL (ref 30.0–36.0)
MCV: 82.8 fL (ref 78.0–100.0)
Monocytes Absolute: 0.8 10*3/uL (ref 0.1–1.0)
Monocytes Relative: 7 %
NEUTROS ABS: 8.9 10*3/uL — AB (ref 1.7–7.7)
NEUTROS PCT: 77 %
Platelets: 252 10*3/uL (ref 150–400)
RBC: 5.35 MIL/uL — AB (ref 3.87–5.11)
RDW: 15.2 % (ref 11.5–15.5)
WBC: 11.6 10*3/uL — ABNORMAL HIGH (ref 4.0–10.5)

## 2017-10-27 LAB — COMPREHENSIVE METABOLIC PANEL
ALBUMIN: 4.2 g/dL (ref 3.5–5.0)
ALK PHOS: 121 U/L (ref 38–126)
ALT: 17 U/L (ref 14–54)
AST: 24 U/L (ref 15–41)
Anion gap: 9 (ref 5–15)
BILIRUBIN TOTAL: 0.8 mg/dL (ref 0.3–1.2)
BUN: 25 mg/dL — AB (ref 6–20)
CALCIUM: 9.5 mg/dL (ref 8.9–10.3)
CO2: 28 mmol/L (ref 22–32)
Chloride: 100 mmol/L — ABNORMAL LOW (ref 101–111)
Creatinine, Ser: 1.24 mg/dL — ABNORMAL HIGH (ref 0.44–1.00)
GFR calc Af Amer: 51 mL/min — ABNORMAL LOW (ref >60)
GFR calc non Af Amer: 44 mL/min — ABNORMAL LOW (ref >60)
GLUCOSE: 125 mg/dL — AB (ref 65–99)
Potassium: 4.2 mmol/L (ref 3.5–5.1)
Sodium: 137 mmol/L (ref 135–145)
TOTAL PROTEIN: 7.3 g/dL (ref 6.5–8.1)

## 2017-10-27 LAB — LIPASE, BLOOD: Lipase: 19 U/L (ref 11–51)

## 2017-10-27 LAB — URINALYSIS, MICROSCOPIC (REFLEX)

## 2017-10-27 LAB — I-STAT CG4 LACTIC ACID, ED: LACTIC ACID, VENOUS: 1.16 mmol/L (ref 0.5–1.9)

## 2017-10-27 LAB — URINALYSIS, ROUTINE W REFLEX MICROSCOPIC
Bilirubin Urine: NEGATIVE
GLUCOSE, UA: NEGATIVE mg/dL
Hgb urine dipstick: NEGATIVE
KETONES UR: NEGATIVE mg/dL
Nitrite: NEGATIVE
PH: 6.5 (ref 5.0–8.0)
Protein, ur: NEGATIVE mg/dL
Specific Gravity, Urine: <1.005 — ABNORMAL LOW (ref 1.005–1.030)

## 2017-10-27 MED ORDER — FENTANYL CITRATE (PF) 100 MCG/2ML IJ SOLN
50.0000 ug | Freq: Once | INTRAMUSCULAR | Status: AC
  Administered 2017-10-27: 50 ug via INTRAVENOUS
  Filled 2017-10-27: qty 2

## 2017-10-27 MED ORDER — ONDANSETRON HCL 4 MG/2ML IJ SOLN
4.0000 mg | Freq: Once | INTRAMUSCULAR | Status: AC
  Administered 2017-10-27: 4 mg via INTRAVENOUS
  Filled 2017-10-27: qty 2

## 2017-10-27 MED ORDER — SODIUM CHLORIDE 0.9 % IV BOLUS (SEPSIS)
1000.0000 mL | Freq: Once | INTRAVENOUS | Status: AC
  Administered 2017-10-27: 1000 mL via INTRAVENOUS

## 2017-10-27 MED ORDER — IOPAMIDOL (ISOVUE-300) INJECTION 61%
100.0000 mL | Freq: Once | INTRAVENOUS | Status: AC | PRN
  Administered 2017-10-27: 80 mL via INTRAVENOUS

## 2017-10-27 MED ORDER — SODIUM CHLORIDE 0.9 % IV SOLN
1000.0000 mL | INTRAVENOUS | Status: DC
  Administered 2017-10-27: 1000 mL via INTRAVENOUS

## 2017-10-27 NOTE — ED Triage Notes (Signed)
Patient states that she is having vomiting after eating and drinking for the last 2 days - reports that she has not been having "a normal BM" for the last 2 weeks

## 2017-10-27 NOTE — ED Provider Notes (Signed)
[facility] EMERGENCY DEPARTMENT Provider Note   CSN: [id number] Arrival date & time: 10/27/17  1040     History   Chief Complaint Chief Complaint  Patient presents with  . Emesis    HPI LILLIANA TURNER is a 66 y.o. female.  Pt presents to the ED today with n/v and decreased BMs.  She did have a bm yesterday, but it was very small.  The pt has not been able to keep anything down. No f/c.  She has had multiple surgeries on her abdomen and is worried about SBO.      Past Medical History:  Diagnosis Date  . Abnormal kidney function    with extremity swelling  . Anxiety   . Arthritis   . Cancer ([place])    skin cancer  . Chronic back pain   . Chronic pain   . Constipation    takes Miralax daily  . Depression    takes Cymbalta daily  . Diabetes mellitus without complication ([place])    takes Metformin daily  . GERD (gastroesophageal reflux disease)    takes Protonix daily  . H/O hiatal hernia   . Headache    last migraine 2 months ago  . History of bronchitis    winter 2016  . History of colon polyps    benign  . History of gout   . History of staph infection    many,many yrs ago  . Hyperlipidemia   . Hypertension    takes Metoprolol daily  . Joint pain   . Low iron    after a miscarriage  . Neuropathy    takes Gabapentin daily  . Overactive bladder    takes Vesicare daily  . Pancreatitis   . Peripheral neuropathy   . Seizures ([place])    last one about 2 yrs ago  . Sleep apnea    does not use Cpap - couldn't keep mask on.  . Slow urinary stream    sees urologist and every 3 months per pt they "ream" her out  . SVT (supraventricular tachycardia) ([place])    on pt's medical record from PCP - pt doesn't remember    Patient Active Problem List   Diagnosis Date Noted  . Altered mental status 07/28/2016  . HTN (hypertension), benign   . Uncontrolled type 2 diabetes mellitus with ketoacidosis without coma, without long-term current use of insulin  ([place])   . DJD (degenerative joint disease) of cervical spine   . Other osteoarthritis of spine, lumbar region   . UTI (lower urinary tract infection) 07/27/2016  . Spinal stenosis of cervical region 07/20/2016  . Adjustment disorder with mixed anxiety and depressed mood 10/11/2015  . Spinal stenosis of lumbar region 10/04/2015  . Unspecified essential hypertension 05/03/2014  . Minigastric bypass in [facility] 2006 05/03/2014  . Hypercholesteremia 05/03/2014  . Cellulitis and abscess of leg, except foot 05/03/2014  . Diffuse cystic mastopathy 05/03/2014  . COPD 05/03/2014  . Urethral stricture unspecified 05/03/2014  . Hx of decompressive lumbar laminectomy2014 [place] [facility] 05/03/2014    Past Surgical History:  Procedure Laterality Date  . ABDOMINAL HYSTERECTOMY    . ANTERIOR CERVICAL DECOMP/DISCECTOMY FUSION N/A 07/20/2016   Procedure: Anterior Cervical Discectomy Fusion - Cervical four-Cervical five- Cervical five -Cervical six - Cervical six- Cervical seven;  Surgeon: Kary Kos, MD;  Location: [facility];  Service: Neurosurgery;  Laterality: N/A;  . BACK SURGERY     x 2   . Stout  N/A 06/18/2014   Procedure: BREATH TEK H PYLORI;  Surgeon: Pedro Earls, MD;  Location: [facility];  Service: General;  Laterality: N/A;  . CESAREAN SECTION     x 3  . COLONOSCOPY    . ESOPHAGOGASTRODUODENOSCOPY    . EYE SURGERY Bilateral    cataract surgery  . GASTRIC BYPASS     minigastric bypass 2006 in [facility]  . HERNIA REPAIR    . JOINT REPLACEMENT     bil knee  . LAPAROSCOPIC BILATERAL SALPINGO OOPHERECTOMY    . TONSILLECTOMY      OB History    No data available       Home Medications    Prior to Admission medications   Medication Sig Start Date End Date Taking? Authorizing Provider  hydrOXYzine (ATARAX/VISTARIL) 25 MG tablet Take 25 mg by mouth 3 (three) times daily as needed.   Yes [provider]  acetaminophen (TYLENOL) 500 MG tablet Take 2 tablets  (1,000 mg total) by mouth every 6 (six) hours as needed for moderate pain. 07/28/16   Holley Raring, MD  bumetanide (BUMEX) 1 MG tablet Take 0.5-1 mg by mouth 2 (two) times daily. 1 tablet in the AM, 0.5 tablet in the PM.    [provider]  calcium carbonate (TUMS - DOSED IN MG ELEMENTAL CALCIUM) 500 MG chewable tablet Chew 1 tablet by mouth daily as needed for indigestion or heartburn.     [provider]  calcium-vitamin D (OSCAL WITH D) 500-200 MG-UNIT tablet Take 1 tablet by mouth daily with breakfast.    [provider]  conjugated estrogens (PREMARIN) vaginal cream Place 1 Applicatorful vaginally as directed. Twice a week.    [provider]  doxepin (SINEQUAN) 25 MG capsule Take 25 mg by mouth 3 (three) times daily as needed (itching).     [provider]  DULoxetine (CYMBALTA) 60 MG capsule Take 60 mg by mouth 2 (two) times daily.    [provider]  gabapentin (NEURONTIN) 300 MG capsule Take 600 mg by mouth 3 (three) times daily.    [provider]  magnesium oxide (MAG-OX) 400 MG tablet Take 400 mg by mouth daily.    [provider]  metFORMIN (GLUCOPHAGE) 500 MG tablet Take 500 mg by mouth at bedtime.     [provider]  metoprolol succinate (TOPROL-XL) 50 MG 24 hr tablet Take 50 mg by mouth 2 (two) times daily. 07/05/16   [provider]  metroNIDAZOLE (METROGEL) 1 % gel Apply 1 application topically 2 (two) times daily.    [provider]  Multiple Vitamin (MULTIVITAMIN WITH MINERALS) TABS Take 1 tablet by mouth 2 (two) times daily.    [provider]  ondansetron (ZOFRAN) 8 MG tablet Take 8 mg by mouth every 8 (eight) hours as needed for nausea or vomiting.    [provider]  oxyCODONE-acetaminophen (PERCOCET/ROXICET) 5-325 MG tablet Take 1 tablet by mouth every 6 (six) hours as needed for moderate pain. 07/22/16   Kary Kos, MD  pantoprazole (PROTONIX) 40 MG tablet Take  40 mg by mouth daily.    [provider]  polyethylene glycol (MIRALAX / GLYCOLAX) packet Take 17 g by mouth daily.    [provider]  potassium chloride (MICRO-K) 10 MEQ CR capsule Take 10 mEq by mouth 2 (two) times daily.    [provider]  solifenacin (VESICARE) 5 MG tablet Take 10 mg by mouth daily.     [provider]  spironolactone (ALDACTONE) 50 MG tablet Take 50 mg by mouth 2 (two) times daily.     [provider]  triamcinolone cream (KENALOG) 0.1 % Apply 1 application topically 2 (two) times daily as needed (affected area).    [provider]    Family History Family History  Problem Relation Age of Onset  . COPD Mother   . Cancer Mother        Lung, ab, breast  . Heart failure Father   . Hypertension Sister   . Hyperlipidemia Sister   . Obesity Sister     Social History Social History   Tobacco Use  . Smoking status: Never Smoker  . Smokeless tobacco: Never Used  Substance Use Topics  . Alcohol use: Yes    Comment: socially,once a month  . Drug use: No     Allergies   Bee venom; Oxaprozin; Erythrocin [erythromycin]; Penicillins; Clindamycin/lincomycin; Fioricet [butalbital-apap-caffeine]; Hydrocodone-acetaminophen; Morphine and related; Phenobarbital; Teflaro [ceftaroline]; Tramadol; and Vancomycin   Review of Systems Review of Systems  Gastrointestinal: Positive for abdominal pain, nausea and vomiting.  All other systems reviewed and are negative.    Physical Exam Updated Vital Signs BP 110/62 (BP Location: Left Arm)   Pulse 77   Temp 98.3 F (36.8 C) (Oral)   Resp 16   Ht 5\' 1"  (1.549 m)   Wt 86.2 kg (190 lb)   SpO2 98%   BMI 35.90 kg/m   Physical Exam  Constitutional: She is oriented to person, place, and time. She appears well-developed and well-nourished.  HENT:  Head: Normocephalic and atraumatic.  Right Ear: External ear normal.  Left Ear: External ear normal.  Nose: Nose normal.    Mouth/Throat: Mucous membranes are dry.  Eyes: Conjunctivae and EOM are normal. Pupils are equal, round, and reactive to light.  Neck: Normal range of motion. Neck supple.  Cardiovascular: Normal rate, regular rhythm, normal heart sounds and intact distal pulses.  Pulmonary/Chest: Effort normal and breath sounds normal.  Abdominal: There is tenderness in the right upper quadrant, epigastric area, periumbilical area and left lower quadrant.  Musculoskeletal: Normal range of motion.  Neurological: She is alert and oriented to person, place, and time.  Skin: Skin is warm and dry. Capillary refill takes less than 2 seconds.  Psychiatric: She has a normal mood and affect. Her behavior is normal. Judgment and thought content normal.  Nursing note and vitals reviewed.    ED Treatments / Results  Labs (all labs ordered are listed, but only abnormal results are displayed) Labs Reviewed  CBC WITH DIFFERENTIAL/PLATELET - Abnormal; Notable for the following components:      Result Value   WBC 11.6 (*)    RBC 5.35 (*)    Neutro Abs 8.9 (*)    All other components within normal limits  COMPREHENSIVE METABOLIC PANEL - Abnormal; Notable for the following components:   Chloride 100 (*)    Glucose, Bld 125 (*)    BUN 25 (*)    Creatinine, Ser 1.24 (*)    GFR calc non Af Amer 44 (*)    GFR calc Af Amer 51 (*)    All other components within normal limits  URINALYSIS, ROUTINE W REFLEX MICROSCOPIC - Abnormal; Notable for the following components:   Specific Gravity, Urine <1.005 (*)    Leukocytes, UA TRACE (*)    All other components within normal limits  URINALYSIS, MICROSCOPIC (REFLEX) - Abnormal; Notable for the following components:   Bacteria, UA RARE (*)  Squamous Epithelial / LPF 0-5 (*)    All other components within normal limits  LIPASE, BLOOD  I-STAT CG4 LACTIC ACID, ED    EKG  EKG Interpretation None       Radiology Ct Abdomen Pelvis W Contrast  Result Date:  10/27/2017 CLINICAL DATA:  Postprandial vomiting. EXAM: CT ABDOMEN AND PELVIS WITH CONTRAST TECHNIQUE: Multidetector CT imaging of the abdomen and pelvis was performed using the standard protocol following bolus administration of intravenous contrast. CONTRAST:  82mL ISOVUE-300 IOPAMIDOL (ISOVUE-300) INJECTION 61% COMPARISON:  05/22/2015. FINDINGS: Lower chest: 3 mm subpleural left lower lobe nodule, unchanged and benign. Mild dependent atelectasis bilaterally. Heart is at the upper limits of normal in size to mildly enlarged. Coronary artery calcification. No pericardial or pleural effusion. Distal esophageal varices. Distal esophagus is otherwise unremarkable. Hepatobiliary: Liver is unremarkable. Cholecystectomy. No biliary ductal dilatation. Pancreas: Negative. Spleen: Negative. Adrenals/Urinary Tract: Adrenal glands and kidneys are unremarkable. Ureters are decompressed. Bladder is unremarkable. Stomach/Bowel: Postoperative changes of gastric bypass. There is fluid retention within the gastric pouch as well as dilated proximal small bowel. A transition point is seen in the low central anatomic pelvis, with small bowel feces sign. Remainder of the small bowel is decompressed. Stool is seen throughout the colon, indicative of constipation. Vascular/Lymphatic: Atherosclerotic calcification of the arterial vasculature without abdominal aortic aneurysm. No pathologically enlarged lymph nodes. Reproductive: Hysterectomy.  No adnexal mass. Other: Trace pelvic free fluid. Surgical clips along the left pelvic sidewall. There may be very mild mesenteric edema associated with the distal aspect of dilated small bowel. Musculoskeletal: No worrisome lytic or sclerotic lesions. Degenerative and postoperative changes in the spine. L1 compression fracture, unchanged. IMPRESSION: 1. High-grade small bowel obstruction with transition point in the low central anatomic pelvis. Very mild mesenteric edema can indicate impending  ischemia. 2. Aortic atherosclerosis (ICD10-170.0). Coronary artery calcification. Electronically Signed   By: Lorin Picket M.D.   On: 10/27/2017 13:26   Dg Abd Acute W/chest  Result Date: 10/27/2017 CLINICAL DATA:  Upper abdominal pain EXAM: DG ABDOMEN ACUTE W/ 1V CHEST COMPARISON:  Chest x-ray 07/27/2016.  CT A/P 05/22/2015 FINDINGS: Mildly prominent mid abdominal small bowel loops with air-fluid levels. Cannot exclude early low grade small bowel obstruction. Calcification just above the right iliac crest was shown on prior CT to represent a gonadal vein phlebolith. No free air organomegaly. Prior cholecystectomy. Heart and mediastinal contours are within normal limits. No focal opacities or effusions. No acute bony abnormality. IMPRESSION: Mildly prominent mid abdominal small bowel loops with a few air-fluid levels. Cannot exclude early small bowel obstruction. Electronically Signed   By: Rolm Baptise M.D.   On: 10/27/2017 11:30    Procedures Procedures (including critical care time)  Medications Ordered in ED Medications  0.9 %  sodium chloride infusion (0 mLs Intravenous Stopped 10/27/17 1314)  sodium chloride 0.9 % bolus 1,000 mL (not administered)  ondansetron (ZOFRAN) injection 4 mg (4 mg Intravenous Given 10/27/17 1201)  fentaNYL (SUBLIMAZE) injection 50 mcg (50 mcg Intravenous Given 10/27/17 1200)  ondansetron (ZOFRAN) injection 4 mg (4 mg Intravenous Given 10/27/17 1316)  fentaNYL (SUBLIMAZE) injection 50 mcg (50 mcg Intravenous Given 10/27/17 1315)  iopamidol (ISOVUE-300) 61 % injection 100 mL (80 mLs Intravenous Contrast Given 10/27/17 1256)     Initial Impression / Assessment and Plan / ED Course  I have reviewed the triage vital signs and the nursing notes.  Pertinent labs & imaging results that were available during my care of the patient were reviewed  by me and considered in my medical decision making (see chart for details).    Pt has high grade SBO on CT scan, so a  NG tube was place.  Pt does feel a little better with the NG in place.  The pt requested to go to [facility].  I called general surgery, but they said they don't admit primarily and to call the hospitalist.  The pt d/w Dr. Georganna Skeans (hospitalist) who will accept pt for transfer.  Pt does remain stable for transfer.   Final Clinical Impressions(s) / ED Diagnoses   Final diagnoses:  Small bowel obstruction [facility])    ED Discharge Orders    None       Isla Pence, MD 10/27/17 1515

## 2017-10-27 NOTE — ED Provider Notes (Signed)
Patient remained stable for transfer.  Vital signs unremarkable.  Symptomatically much improved after the NG tube.  Transferred to [facility].   Sherwood Gambler, MD 10/27/17 [phone]

## 2017-10-27 NOTE — ED Notes (Signed)
ED Provider at bedside. DR Regenia Skeeter in to eval pt prior to transfer to [facility]

## 2017-10-27 NOTE — ED Notes (Signed)
Pt ambulatory to rest room with no assistance and no difficulty

## 2019-12-21 ENCOUNTER — Ambulatory Visit: Payer: Medicare Other | Attending: Internal Medicine

## 2019-12-21 DIAGNOSIS — Z23 Encounter for immunization: Secondary | ICD-10-CM | POA: Insufficient documentation

## 2019-12-21 NOTE — Progress Notes (Signed)
   Covid-19 Vaccination Clinic  Name:  [name] [name]    MRN: [id number] DOB: 01-Oct-1951  12/21/2019  [name] [name] was observed post Covid-19 immunization for 30 minutes based on pre-vaccination screening without incidence. She was provided with Vaccine Information Sheet and instruction to access the V-Safe system.   [name] [name] was instructed to call 911 with any severe reactions post vaccine: Marland Kitchen Difficulty breathing  . Swelling of your face and throat  . A fast heartbeat  . A bad rash all over your body  . Dizziness and weakness    Immunizations Administered    Name Date Dose VIS Date Route   Pfizer COVID-19 Vaccine 12/21/2019  4:46 PM 0.3 mL 11/10/2019 Intramuscular   Manufacturer: [place]   Lot: [id number]   [place]: [id number]

## 2020-01-12 ENCOUNTER — Ambulatory Visit: Payer: Medicare Other | Attending: Internal Medicine

## 2020-01-12 DIAGNOSIS — Z23 Encounter for immunization: Secondary | ICD-10-CM | POA: Insufficient documentation

## 2020-01-12 NOTE — Progress Notes (Signed)
   Covid-19 Vaccination Clinic  Name:  [name] [name]    MRN: [id number] DOB: [birth year]  01/12/2020  [name] was observed post Covid-19 immunization for 30 minutes based on pre-vaccination screening without incidence. She was provided with Vaccine Information Sheet and instruction to access the V-Safe system.   [name] was instructed to call 911 with any severe reactions post vaccine: Marland Kitchen Difficulty breathing  . Swelling of your face and throat  . A fast heartbeat  . A bad rash all over your body  . Dizziness and weakness    Immunizations Administered    Name Date Dose VIS Date Route   Pfizer COVID-19 Vaccine 01/12/2020  3:13 PM 0.3 mL 11/10/2019 Intramuscular   Manufacturer: [place]   Lot: [id number]   [place]: [id number]

## 2023-08-30 ENCOUNTER — Emergency Department (HOSPITAL_BASED_OUTPATIENT_CLINIC_OR_DEPARTMENT_OTHER): Payer: Medicare Other

## 2023-08-30 ENCOUNTER — Emergency Department (HOSPITAL_BASED_OUTPATIENT_CLINIC_OR_DEPARTMENT_OTHER)
Admission: EM | Admit: 2023-08-30 | Discharge: 2023-08-30 | Disposition: A | Discharge status: Home / Self Care | Payer: Medicare Other | Attending: Emergency Medicine | Admitting: Emergency Medicine

## 2023-08-30 ENCOUNTER — Other Ambulatory Visit: Payer: Self-pay

## 2023-08-30 ENCOUNTER — Encounter (HOSPITAL_BASED_OUTPATIENT_CLINIC_OR_DEPARTMENT_OTHER): Payer: Self-pay | Admitting: Emergency Medicine

## 2023-08-30 DIAGNOSIS — Z79899 Other long term (current) drug therapy: Secondary | ICD-10-CM | POA: Diagnosis not present

## 2023-08-30 DIAGNOSIS — Z7984 Long term (current) use of oral hypoglycemic drugs: Secondary | ICD-10-CM | POA: Diagnosis not present

## 2023-08-30 DIAGNOSIS — M25551 Pain in right hip: Secondary | ICD-10-CM | POA: Diagnosis not present

## 2023-08-30 DIAGNOSIS — W133XXA Fall through floor, initial encounter: Secondary | ICD-10-CM | POA: Insufficient documentation

## 2023-08-30 DIAGNOSIS — S0181XA Laceration without foreign body of other part of head, initial encounter: Secondary | ICD-10-CM | POA: Insufficient documentation

## 2023-08-30 DIAGNOSIS — W19XXXA Unspecified fall, initial encounter: Secondary | ICD-10-CM

## 2023-08-30 DIAGNOSIS — S0990XA Unspecified injury of head, initial encounter: Secondary | ICD-10-CM | POA: Diagnosis present

## 2023-08-30 MED ORDER — BACITRACIN ZINC 500 UNIT/GM EX OINT: 1.0000 | TOPICAL_OINTMENT | Freq: Two times a day (BID) | CUTANEOUS | 0 refills | Status: AC

## 2023-08-30 MED ORDER — LIDOCAINE-EPINEPHRINE (PF) 2 %-1:200000 IJ SOLN
10.0000 mL | Freq: Once | INTRAMUSCULAR | Status: AC
  Administered 2023-08-30: 10 mL
  Filled 2023-08-30: qty 20

## 2023-08-30 MED ORDER — BACITRACIN ZINC 500 UNIT/GM EX OINT
TOPICAL_OINTMENT | Freq: Two times a day (BID) | CUTANEOUS | Status: DC
  Administered 2023-08-30: 31.5 via TOPICAL
  Filled 2023-08-30: qty 28.35

## 2023-08-30 MED ORDER — ACETAMINOPHEN 500 MG PO TABS
1000.0000 mg | ORAL_TABLET | Freq: Once | ORAL | Status: AC
  Administered 2023-08-30: 1000 mg via ORAL
  Filled 2023-08-30: qty 2

## 2023-08-30 NOTE — ED Triage Notes (Addendum)
Lost her balance this mooring , fell forward , forehead laceration , bleeding controlled , no loc .  Not on blood thinner . Alert and oriented x 4 . Reports seeing a neurologist for multiple falls and imbalance issues .  Adds right leg pain . Takes tramadol every night

## 2023-08-30 NOTE — ED Provider Notes (Signed)
[facility] Provider Note   CSN: [id number] Arrival date & time: 08/30/23  1108     History  Chief Complaint  Patient presents with   Fall    Head injury    [name] [name] is a 72 y.o. female.  HPI Patient reports that she fell in her home this morning.  She was taking her dog out and reports that she turned and lost her balance falling forward.  She reports she hit her head on the floor.  No loss of consciousness.  Patient subsequently drove to her son's work.  She reports that she had a dermatologist appointment that same day and wanted to go to her dermatologist appointment despite having a significant laceration to her forehead.  Patient's son then brought her to the emergency department for further evaluation.  Patient reports that her forehead does hurt and she has somewhat of a headache now.  She reports she has a lot of chronic neck and back pain.  She does however feel that after she fell she has worse pain in her right hip than previously.  She was however up and ambulatory and able to drive her car.  Patient does not take any blood thinners.    Home Medications Prior to Admission medications   Medication Sig Start Date End Date Taking? Authorizing Provider  bacitracin ointment Apply 1 Application topically 2 (two) times daily. 08/30/23  Yes Arby Barrette, MD  acetaminophen (TYLENOL) 500 MG tablet Take 2 tablets (1,000 mg total) by mouth every 6 (six) hours as needed for moderate pain. 07/28/16   Carolynn Comment, MD  bumetanide (BUMEX) 1 MG tablet Take 0.5-1 mg by mouth 2 (two) times daily. 1 tablet in the AM, 0.5 tablet in the PM.    [provider]  calcium carbonate (TUMS - DOSED IN MG ELEMENTAL CALCIUM) 500 MG chewable tablet Chew 1 tablet by mouth daily as needed for indigestion or heartburn.     [provider]  calcium-vitamin D (OSCAL WITH D) 500-200 MG-UNIT tablet Take 1 tablet by mouth daily with  breakfast.    [provider]  conjugated estrogens (PREMARIN) vaginal cream Place 1 Applicatorful vaginally as directed. Twice a week.    [provider]  doxepin (SINEQUAN) 25 MG capsule Take 25 mg by mouth 3 (three) times daily as needed (itching).     [provider]  DULoxetine (CYMBALTA) 60 MG capsule Take 60 mg by mouth 2 (two) times daily.    [provider]  gabapentin (NEURONTIN) 300 MG capsule Take 600 mg by mouth 3 (three) times daily.    [provider]  hydrOXYzine (ATARAX/VISTARIL) 25 MG tablet Take 25 mg by mouth 3 (three) times daily as needed.    [provider]  magnesium oxide (MAG-OX) 400 MG tablet Take 400 mg by mouth daily.    [provider]  metFORMIN (GLUCOPHAGE) 500 MG tablet Take 500 mg by mouth at bedtime.     [provider]  metoprolol succinate (TOPROL-XL) 50 MG 24 hr tablet Take 50 mg by mouth 2 (two) times daily. 07/05/16   [provider]  metroNIDAZOLE (METROGEL) 1 % gel Apply 1 application topically 2 (two) times daily.    [provider]  Multiple Vitamin (MULTIVITAMIN WITH MINERALS) TABS Take 1 tablet by mouth 2 (two) times daily.    [provider]  ondansetron (ZOFRAN) 8 MG tablet Take 8 mg by mouth every 8 (eight) hours  as needed for nausea or vomiting.    [provider]  oxyCODONE-acetaminophen (PERCOCET/ROXICET) 5-325 MG tablet Take 1 tablet by mouth every 6 (six) hours as needed for moderate pain. 07/22/16   Donalee Citrin, MD  pantoprazole (PROTONIX) 40 MG tablet Take 40 mg by mouth daily.    [provider]  polyethylene glycol (MIRALAX / GLYCOLAX) packet Take 17 g by mouth daily.    [provider]  potassium chloride (MICRO-K) 10 MEQ CR capsule Take 10 mEq by mouth 2 (two) times daily.    [provider]  solifenacin (VESICARE) 5 MG tablet Take 10 mg by mouth daily.     [provider]  spironolactone (ALDACTONE) 50  MG tablet Take 50 mg by mouth 2 (two) times daily.     [provider]  triamcinolone cream (KENALOG) 0.1 % Apply 1 application topically 2 (two) times daily as needed (affected area).    [provider]      Allergies    Bee venom, Oxaprozin, Erythrocin [erythromycin], Penicillins, Clindamycin/lincomycin, Fioricet [butalbital-apap-caffeine], Hydrocodone-acetaminophen, Morphine and codeine, Phenobarbital, Teflaro [ceftaroline], Tramadol, and Vancomycin    Review of Systems   Review of Systems  Physical Exam Updated Vital Signs BP 139/63   Pulse 65   Temp 97.8 F (36.6 C) (Oral)   Resp 17   SpO2 99%  Physical Exam Constitutional:      Comments: Alert nontoxic no acute distress.  Speaking in full sentences without difficulty.  HENT:     Head:     Comments: Patient has a laceration to the forehead about 2 cm above the left brow.  This is gaping but no active bleeding.    Nose: Nose normal.     Mouth/Throat:     Pharynx: Oropharynx is clear.  Eyes:     Extraocular Movements: Extraocular movements intact.     Pupils: Pupils are equal, round, and reactive to light.  Neck:     Comments: Patient endorses some C-spine tenderness at about the C6-C7 level.  She reports she does have a lot of chronic neck pain as well. Cardiovascular:     Rate and Rhythm: Normal rate and regular rhythm.  Pulmonary:     Effort: Pulmonary effort is normal.     Breath sounds: Normal breath sounds.  Chest:     Chest wall: No tenderness.  Abdominal:     General: There is no distension.     Palpations: Abdomen is soft.     Tenderness: There is no abdominal tenderness. There is no guarding.  Musculoskeletal:     Comments: Range of motion upper extremities intact without deformity.  Patient endorses pain at the right hip and iliac area.  Range of motion is intact.  Patient can flex and extend and push out against resistance.  No evident deformities.  Skin:    General: Skin is warm and dry.   Neurological:     General: No focal deficit present.     Mental Status: She is oriented to person, place, and time.     Motor: No weakness.     Coordination: Coordination normal.  Psychiatric:        Mood and Affect: Mood normal.     ED Results / Procedures / Treatments   Labs (all labs ordered are listed, but only abnormal results are displayed) Labs Reviewed - No data to display  EKG None  Radiology CT Head Wo Contrast  Result Date: 08/30/2023 CLINICAL DATA:  Head trauma, minor (Age >=  65y); Neck trauma (Age >= 65y) Lost her balance this morning , fell forward , forehead laceration , bleeding controlled , no loc . Not on blood thinner .Reports seeing a neurologist for multiple falls and imbalance issues EXAM: CT HEAD WITHOUT CONTRAST CT CERVICAL SPINE WITHOUT CONTRAST TECHNIQUE: Multidetector CT imaging of the head and cervical spine was performed following the standard protocol without intravenous contrast. Multiplanar CT image reconstructions of the cervical spine were also generated. RADIATION DOSE REDUCTION: This exam was performed according to the departmental dose-optimization program which includes automated exposure control, adjustment of the mA and/or kV according to patient size and/or use of iterative reconstruction technique. COMPARISON:  X-ray cervical spine 07/20/2016, CT head 05/14/2022, CT C-spine 05/14/2022 FINDINGS: CT HEAD FINDINGS Brain: Patchy and confluent areas of decreased attenuation are noted throughout the deep and periventricular white matter of the cerebral hemispheres bilaterally, compatible with chronic microvascular ischemic disease. No evidence of large-territorial acute infarction. No parenchymal hemorrhage. No mass lesion. No extra-axial collection. No mass effect or midline shift. No hydrocephalus. Basilar cisterns are patent. Vascular: No hyperdense vessel. Skull: No acute fracture or focal lesion. Sinuses/Orbits: Paranasal sinuses and mastoid air cells  are clear. Bilateral lens replacement. Otherwise the orbits are unremarkable. Other: None. CT CERVICAL SPINE FINDINGS Alignment: Stable grade 1 anterolisthesis of C3 on C4. Skull base and vertebrae: C4 through C7 anterior cervical discectomy and fusion surgical hardware. No acute fracture. No aggressive appearing focal osseous lesion or focal pathologic process. Soft tissues and spinal canal: No prevertebral fluid or swelling. No visible canal hematoma. Upper chest: Unremarkable. Other: None. IMPRESSION: 1. No acute intracranial abnormality. 2. No acute displaced fracture or traumatic listhesis of the cervical spine. Electronically Signed   By: Tish Frederickson M.D.   On: 08/30/2023 13:47   CT Cervical Spine Wo Contrast  Result Date: 08/30/2023 CLINICAL DATA:  Head trauma, minor (Age >= 65y); Neck trauma (Age >= 65y) Lost her balance this morning , fell forward , forehead laceration , bleeding controlled , no loc . Not on blood thinner .Reports seeing a neurologist for multiple falls and imbalance issues EXAM: CT HEAD WITHOUT CONTRAST CT CERVICAL SPINE WITHOUT CONTRAST TECHNIQUE: Multidetector CT imaging of the head and cervical spine was performed following the standard protocol without intravenous contrast. Multiplanar CT image reconstructions of the cervical spine were also generated. RADIATION DOSE REDUCTION: This exam was performed according to the departmental dose-optimization program which includes automated exposure control, adjustment of the mA and/or kV according to patient size and/or use of iterative reconstruction technique. COMPARISON:  X-ray cervical spine 07/20/2016, CT head 05/14/2022, CT C-spine 05/14/2022 FINDINGS: CT HEAD FINDINGS Brain: Patchy and confluent areas of decreased attenuation are noted throughout the deep and periventricular white matter of the cerebral hemispheres bilaterally, compatible with chronic microvascular ischemic disease. No evidence of large-territorial acute  infarction. No parenchymal hemorrhage. No mass lesion. No extra-axial collection. No mass effect or midline shift. No hydrocephalus. Basilar cisterns are patent. Vascular: No hyperdense vessel. Skull: No acute fracture or focal lesion. Sinuses/Orbits: Paranasal sinuses and mastoid air cells are clear. Bilateral lens replacement. Otherwise the orbits are unremarkable. Other: None. CT CERVICAL SPINE FINDINGS Alignment: Stable grade 1 anterolisthesis of C3 on C4. Skull base and vertebrae: C4 through C7 anterior cervical discectomy and fusion surgical hardware. No acute fracture. No aggressive appearing focal osseous lesion or focal pathologic process. Soft tissues and spinal canal: No prevertebral fluid or swelling. No visible canal hematoma. Upper chest: Unremarkable. Other: None. IMPRESSION: 1. No  acute intracranial abnormality. 2. No acute displaced fracture or traumatic listhesis of the cervical spine. Electronically Signed   By: Tish Frederickson M.D.   On: 08/30/2023 13:47    Procedures .Marland KitchenLaceration Repair  Date/Time: 08/30/2023 2:22 PM  Performed by: Arby Barrette, MD Authorized by: Arby Barrette, MD   Consent:    Consent obtained:  Verbal   Consent given by:  Patient   Risks discussed:  Infection, need for additional repair, nerve damage, pain, poor cosmetic result, poor wound healing, retained foreign body, tendon damage and vascular damage Anesthesia:    Anesthesia method:  Local infiltration   Local anesthetic:  Lidocaine 2% WITH epi Laceration details:    Location:  Face   Face location:  Forehead   Length (cm):  3   Depth (mm):  7 Pre-procedure details:    Preparation:  Patient was prepped and draped in usual sterile fashion Exploration:    Wound exploration: entire depth of wound visualized     Wound extent: fascia violated, foreign bodies/material and muscle damage     Foreign bodies/material:  Very small chips (3mm) probably paint or glaze from a piece of furniture    Contaminated: no   Treatment:    Area cleansed with:  Shur-Clens   Amount of cleaning:  Extensive   Irrigation solution:  Sterile water   Irrigation volume:  200   Irrigation method:  Syringe   Visualized foreign bodies/material removed: yes     Layers/structures repaired:  Donnetta Hutching and muscle belly Galea:    Suture size:  5-0   Suture material:  Vicryl   Suture technique:  Simple interrupted   Number of sutures:  2 Muscle belly:    Suture size:  5-0   Suture material:  Vicryl   Suture technique:  Simple interrupted   Number of sutures:  2 Skin repair:    Repair method:  Sutures   Suture size:  6-0   Suture material:  Nylon   Suture technique:  Simple interrupted   Number of sutures:  6 Approximation:    Approximation:  Close Repair type:    Repair type:  Complex Post-procedure details:    Dressing:  Antibiotic ointment   Procedure completion:  Tolerated well, no immediate complications Comments:     Wound was linear with penetration through the muscle body and a small defect in the galea.  Wound was not actively bleeding.  Extensively cleaned and irrigated.  I found 3 small flakes of material that looked to be paint chips likely off of piece of furniture or something that the patient fell onto.  Wound was not contaminated with any dirty material.  Layered closure with good approximation.  6-0 Ethilon for skin closure.     Medications Ordered in ED Medications  bacitracin ointment (31.5 Applications Topical Given 08/30/23 1425)  lidocaine-EPINEPHrine (XYLOCAINE W/EPI) 2 %-1:200000 (PF) injection 10 mL (10 mLs Infiltration Given by Other 08/30/23 1155)  acetaminophen (TYLENOL) tablet 1,000 mg (1,000 mg Oral Given 08/30/23 1425)    ED Course/ Medical Decision Making/ A&P                                 Medical Decision Making Amount and/or Complexity of Data Reviewed Radiology: ordered.  Risk OTC drugs. Prescription drug management.   Patient had a mechanical fall.   She reports that she has been having frequent falls for a while and had a lot of evaluation  without any specific cause for her imbalance and falls.  He did not feel that anything different happened today.  She was trying to let her dog out lost her balance and falling again.  She has a large forehead laceration.  Will proceed with CT scan of the head and C-spine given mechanism of injury.  Patient's mental status is clear.  She actually drove her car after the event.  She does report a lot of pain in the right hip.  Will proceed with hip x-rays.  Possible pelvic fracture, doubt intertrochanteric or femoral neck fracture given the patient has been ambulatory.  Laceration repair as outlined.  Patient's wound was extensively cleaned and repaired.  CT head and C-spine personally reviewed by myself.  I see no intracranial bleed or evidence significant trauma.  As interpreted radiology no acute findings.  X-ray right hip and pelvis is pending.  I have personally reviewed these images and do not identify any acute fractures.  Will await radiology read as well.  Patient and son advised that the read is not done yet on plain films but I have reviewed them and did not identify acute finding.  They feel comfortable going home and following up on final read through MyChart.  Patient discharged in good condition.  She will have family to observe for the next day.        Final Clinical Impression(s) / ED Diagnoses Final diagnoses:  Fall, initial encounter  Injury of head, initial encounter  Face lacerations, initial encounter  Right hip pain    Rx / DC Orders ED Discharge Orders          Ordered    bacitracin ointment  2 times daily        08/30/23 1534              Arby Barrette, MD 08/30/23 1559

## 2023-08-30 NOTE — ED Notes (Signed)
ED Provider at bedside. 

## 2023-08-30 NOTE — Discharge Instructions (Addendum)
1.  Keep your laceration clean and apply antibiotic ointment twice a day.  Put a Band-Aid over it.  You should have your sutures out in 7 days. 2.  Return if you have any confusion, bad headache, unexplained visual problems or other concerning changes. 3.  You may take extra strength Tylenol for pain. Apply Ice packs to any area that is swollen or bruised.

## 2023-12-13 ENCOUNTER — Emergency Department (HOSPITAL_BASED_OUTPATIENT_CLINIC_OR_DEPARTMENT_OTHER)
Admission: EM | Admit: 2023-12-13 | Discharge: 2023-12-13 | Disposition: A | Discharge status: Home / Self Care | Payer: Medicare Other | Attending: Emergency Medicine | Admitting: Emergency Medicine

## 2023-12-13 ENCOUNTER — Emergency Department (HOSPITAL_BASED_OUTPATIENT_CLINIC_OR_DEPARTMENT_OTHER): Payer: Medicare Other

## 2023-12-13 ENCOUNTER — Encounter (HOSPITAL_BASED_OUTPATIENT_CLINIC_OR_DEPARTMENT_OTHER): Payer: Self-pay

## 2023-12-13 DIAGNOSIS — W19XXXA Unspecified fall, initial encounter: Secondary | ICD-10-CM

## 2023-12-13 DIAGNOSIS — S01111A Laceration without foreign body of right eyelid and periocular area, initial encounter: Secondary | ICD-10-CM | POA: Insufficient documentation

## 2023-12-13 DIAGNOSIS — R55 Syncope and collapse: Secondary | ICD-10-CM | POA: Diagnosis not present

## 2023-12-13 DIAGNOSIS — Z23 Encounter for immunization: Secondary | ICD-10-CM | POA: Insufficient documentation

## 2023-12-13 DIAGNOSIS — S2232XA Fracture of one rib, left side, initial encounter for closed fracture: Secondary | ICD-10-CM

## 2023-12-13 DIAGNOSIS — S0990XA Unspecified injury of head, initial encounter: Secondary | ICD-10-CM | POA: Diagnosis present

## 2023-12-13 DIAGNOSIS — Z79899 Other long term (current) drug therapy: Secondary | ICD-10-CM | POA: Insufficient documentation

## 2023-12-13 DIAGNOSIS — I10 Essential (primary) hypertension: Secondary | ICD-10-CM | POA: Insufficient documentation

## 2023-12-13 DIAGNOSIS — S0181XA Laceration without foreign body of other part of head, initial encounter: Secondary | ICD-10-CM

## 2023-12-13 DIAGNOSIS — W010XXA Fall on same level from slipping, tripping and stumbling without subsequent striking against object, initial encounter: Secondary | ICD-10-CM | POA: Diagnosis not present

## 2023-12-13 MED ORDER — TRAMADOL HCL 50 MG PO TABS: 50.0000 mg | ORAL_TABLET | Freq: Four times a day (QID) | ORAL | 0 refills | Status: AC | PRN

## 2023-12-13 MED ORDER — LIDOCAINE-EPINEPHRINE (PF) 2 %-1:200000 IJ SOLN
20.0000 mL | Freq: Once | INTRAMUSCULAR | Status: AC
  Administered 2023-12-13: 20 mL
  Filled 2023-12-13: qty 20

## 2023-12-13 MED ORDER — TETANUS-DIPHTH-ACELL PERTUSSIS 5-2.5-18.5 LF-MCG/0.5 IM SUSY
0.5000 mL | PREFILLED_SYRINGE | Freq: Once | INTRAMUSCULAR | Status: AC
  Administered 2023-12-13: 0.5 mL via INTRAMUSCULAR
  Filled 2023-12-13: qty 0.5

## 2023-12-13 NOTE — ED Provider Notes (Signed)
 [name] Provider Note   CSN: [id number] Arrival date & time: 12/13/23  9183     History  Chief Complaint  Patient presents with   [name] [name] is a 73 y.o. female with past medical history significant for hypertension, hyperlipidemia, arthritis, anxiety, GERD, who lives at [facility] presents with a mechanical, nonsyncopal fall earlier this morning.  Patient tripped on phone cord and hit her head.  She denies loss of consciousness, does not take any blood thinners.  Large laceration noted to right forehead on arrival.  She denies any neck/back pain.  Nothing given for pain prior to arrival.   [facility] Medications Prior to Admission medications   Medication Sig Start Date End Date Taking? Authorizing Provider  acetaminophen  (TYLENOL ) 500 MG tablet Take 2 tablets (1,000 mg total) by mouth every 6 (six) hours as needed for moderate pain. 07/28/16   Sophia Pride, MD  bacitracin  ointment Apply 1 Application topically 2 (two) times daily. 08/30/23   Armenta Canning, MD  bumetanide  (BUMEX ) 1 MG tablet Take 0.5-1 mg by mouth 2 (two) times daily. 1 tablet in the AM, 0.5 tablet in the PM.    [provider]  calcium  carbonate (TUMS - DOSED IN MG ELEMENTAL CALCIUM ) 500 MG chewable tablet Chew 1 tablet by mouth daily as needed for indigestion or heartburn.     [provider]  calcium -vitamin D  (OSCAL WITH D) 500-200 MG-UNIT tablet Take 1 tablet by mouth daily with breakfast.    [provider]  conjugated estrogens  (PREMARIN ) vaginal cream Place 1 Applicatorful vaginally as directed. Twice a week.    [provider]  doxepin  (SINEQUAN ) 25 MG capsule Take 25 mg by mouth 3 (three) times daily as needed (itching).     [provider]  DULoxetine  (CYMBALTA ) 60 MG capsule Take 60 mg by mouth 2 (two) times daily.    [provider]  gabapentin  (NEURONTIN )  300 MG capsule Take 600 mg by mouth 3 (three) times daily.    [provider]  hydrOXYzine  (ATARAX /VISTARIL ) 25 MG tablet Take 25 mg by mouth 3 (three) times daily as needed.    [provider]  magnesium  oxide (MAG-OX) 400 MG tablet Take 400 mg by mouth daily.    [provider]  metFORMIN  (GLUCOPHAGE ) 500 MG tablet Take 500 mg by mouth at bedtime.     [provider]  metoprolol  succinate (TOPROL -XL) 50 MG 24 hr tablet Take 50 mg by mouth 2 (two) times daily. 07/05/16   [provider]  metroNIDAZOLE (METROGEL) 1 % gel Apply 1 application topically 2 (two) times daily.    [provider]  Multiple Vitamin (MULTIVITAMIN WITH MINERALS) TABS Take 1 tablet by mouth 2 (two) times daily.    [provider]  ondansetron  (ZOFRAN ) 8 MG tablet Take 8 mg by mouth every 8 (eight) hours as needed for nausea or vomiting.    [provider]  oxyCODONE -acetaminophen  (PERCOCET/ROXICET) 5-325 MG tablet Take 1 tablet by mouth every 6 (six) hours as needed for moderate pain. 07/22/16   Onetha Kuba, MD  pantoprazole  (PROTONIX ) 40 MG tablet Take 40 mg by mouth daily.    [provider]  polyethylene glycol (MIRALAX  / GLYCOLAX ) packet Take 17 g by mouth daily.    [provider]  potassium chloride  (MICRO-K ) 10 MEQ CR capsule Take 10 mEq by mouth 2 (two) times  daily.    [provider]  solifenacin (VESICARE) 5 MG tablet Take 10 mg by mouth daily.     [provider]  spironolactone  (ALDACTONE ) 50 MG tablet Take 50 mg by mouth 2 (two) times daily.     [provider]  triamcinolone cream (KENALOG) 0.1 % Apply 1 application topically 2 (two) times daily as needed (affected area).    [provider]      Allergies    Bee venom, Oxaprozin, Erythrocin [erythromycin ], Penicillins, Clindamycin/lincomycin, Fioricet [butalbital-apap-caffeine], Hydrocodone-acetaminophen , Morphine and codeine, Phenobarbital,  Teflaro [ceftaroline], Tramadol , and Vancomycin     Review of Systems   Review of Systems  All other systems reviewed and are negative.   Physical Exam Updated Vital Signs BP (!) 181/98 (BP Location: Right Arm)   Pulse 69   Temp 98.2 F (36.8 C) (Oral)   Resp (!) 22   Ht 4' 11.5 (1.511 m)   Wt 91.9 kg   SpO2 100%   BMI 40.24 kg/m  Physical Exam Vitals and nursing note reviewed.  Constitutional:      General: She is not in acute distress.    Appearance: Normal appearance.  HENT:     Head: Normocephalic and atraumatic.  Eyes:     General:        Right eye: No discharge.        Left eye: No discharge.     Comments: EOMs intact bilaterally, no proptosis.  No subconjunctival hemorrhage.  Cardiovascular:     Rate and Rhythm: Normal rate and regular rhythm.     Pulses: Normal pulses.     Heart sounds: No murmur heard.    No friction rub. No gallop.  Pulmonary:     Effort: Pulmonary effort is normal.     Breath sounds: Normal breath sounds.  Abdominal:     General: Bowel sounds are normal.     Palpations: Abdomen is soft.  Musculoskeletal:     Comments: Intact strength 5/5 bilateral upper and lower extremities  Skin:    General: Skin is warm and dry.     Capillary Refill: Capillary refill takes less than 2 seconds.     Comments: Large hematoma and open bleeding laceration right frontal forehead crossing the eyebrow and into the upper right eyelid.  Neurological:     Mental Status: She is alert and oriented to person, place, and time.     Comments: Alert and oriented x 3, CN II through XII grossly intact.  Psychiatric:        Mood and Affect: Mood normal.        Behavior: Behavior normal.     ED Results / Procedures / Treatments   Labs (all labs ordered are listed, but only abnormal results are displayed) Labs Reviewed - No data to display  EKG None  Radiology No results found.  Procedures .Laceration Repair  Date/Time: 12/13/2023 9:49 AM  Performed by:  Rosan Sherlean DEL, PA-C Authorized by: Rosan Sherlean DEL, PA-C   Consent:    Consent obtained:  Verbal   Consent given by:  Patient   Risks, benefits, and alternatives were discussed: yes     Risks discussed:  Pain, infection, need for additional repair, poor cosmetic result and poor wound healing   Alternatives discussed:  No treatment Universal protocol:    Procedure explained and questions answered to patient or proxy's satisfaction: yes     Patient identity confirmed:  Verbally with patient Anesthesia:    Anesthesia method:  Local  infiltration   Local anesthetic:  Lidocaine  2% WITH epi Laceration details:    Location:  Face   Face location:  R eyebrow   Length (cm):  7.6   Depth (mm):  6 Treatment:    Area cleansed with:  Chlorhexidine  and saline   Amount of cleaning:  Extensive Skin repair:    Repair method:  Sutures   Suture size:  5-0   Suture material:  Prolene   Suture technique:  Simple interrupted   Number of sutures:  14 Approximation:    Approximation:  Close Repair type:    Repair type:  Simple Post-procedure details:    Dressing:  Antibiotic ointment   Procedure completion:  Tolerated     Medications Ordered in ED Medications  lidocaine -EPINEPHrine  (XYLOCAINE  W/EPI) 2 %-1:200000 (PF) injection 20 mL (has no administration in time range)  Tdap (BOOSTRIX ) injection 0.5 mL (0.5 mLs Intramuscular Given 12/13/23 0835)    ED Course/ Medical Decision Making/ A&P                                 Medical Decision Making Amount and/or Complexity of Data Reviewed Radiology: ordered.  Risk Prescription drug management.   This patient is a 73 y.o. female  who presents to the ED for concern of fall, head injury.   Differential diagnoses prior to evaluation: The emergent differential diagnosis includes, but is not limited to,  epidural hematoma, subdural hematoma, skull fracture, subarachnoid hemorrhage, unstable cervical spine fracture, concussion vs  other MSK injury . This is not an exhaustive differential.   Past Medical History / Co-morbidities / Social History: hypertension, hyperlipidemia, arthritis, anxiety, GERD  Additional history: Chart reviewed. Pertinent results include: Reviewed outpatient orthopedic visits, lab work, imaging from previous emergency department visits.  Physical Exam: Physical exam performed. The pertinent findings include:  Large hematoma and open bleeding laceration right frontal forehead crossing the eyebrow and into the upper right eyelid.   Intact strength 5/5 bilateral upper and lower extremities  EOMs intact bilaterally, no proptosis.  No subconjunctival hemorrhage.  Alert and oriented x 3, CN II through XII grossly intact.    Lab Tests/Imaging studies: I personally interpreted labs/imaging and the pertinent results include:  CT head, c spine, maxillofacial with no acute fracture, no intracranial injury, possible nondisplaced left first rib fracture noted, on correlation for point tenderness, reveals slight tenderness at left first rib, so possible small fracture is not excluded . I agree with the radiologist interpretation.   Medications: Will discharge with short course of tramadol  per patient's request she has taken this medication in the past and tolerated well.  Encouraged lidocaine  patches for possible nondisplaced rib fracture.  Will provide incentive spirometry.  Otherwise encouraged ibuprofen, Tylenol , wound care for laceration that was repaired as described above.  Bleeding controlled with laceration repair.  Patient is otherwise well appearing and ambulating without difficulty.  Her fall was mechanical in nature, low clinical suspicion for syncopal event.   Disposition: After consideration of the diagnostic results and the patients response to treatment, I feel that patient is stable for discharge with plan as above.   emergency department workup does not suggest an emergent condition  requiring admission or immediate intervention beyond what has been performed at this time. The plan is: as above. The patient is safe for discharge and has been instructed to return immediately for worsening symptoms, change in symptoms or any other concerns.  Final  Clinical Impression(s) / ED Diagnoses Final diagnoses:  None    Rx / DC Orders ED Discharge Orders     None         Rosan Sherlean DEL, PA-C 12/13/23 1003    Ruthe Cornet, DO 12/13/23 1304

## 2023-12-13 NOTE — ED Notes (Signed)
 Lab contacted for bloodwork status

## 2023-12-13 NOTE — ED Notes (Signed)
 Fall risk armband Fall risk sign on door  Fall risk socks

## 2023-12-13 NOTE — ED Triage Notes (Addendum)
 BIB EMS. Tripped on phone cord and hit head, denies LOC, not on thinners. Laceration to right forehead. Bleeding upon arrival. Denies neck/back pain.  Lives at [facility]

## 2023-12-13 NOTE — ED Notes (Addendum)
 Disregard// Error note entry

## 2023-12-13 NOTE — Discharge Instructions (Addendum)
 Please use Tylenol  or ibuprofen for pain.  You may use 600 mg ibuprofen every 6 hours or 1000 mg of Tylenol  every 6 hours.  You may choose to alternate between the 2.  This would be most effective.  Not to exceed 4 g of Tylenol  within 24 hours.  Not to exceed 3200 mg ibuprofen 24 hours.  The lidocaine  patches are not covered by your insurance but there are some over the counter alternatives, vs icy hot or salonpas patches which can also provide some relief to the chest.  The x-ray showed possible nondisplaced rib fracture, likely no additional treatment needed but you can follow-up with your orthopedic physician if you have any concern for ongoing pain in the left upper chest for longer than 3 to 4 weeks.   Try to use the incentive spirometer at least once an hour while you are awake for the next 2 weeks to encourage deep breathing and prevent pneumonia.  Please keep your laceration clean, dry, I would clean the wound and change the bandage at least once daily.  Please monitor for signs of infection including worsening pain, swelling, redness, pus draining from the affected site.  Please follow-up in around 7 to 10 days for suture removal.

## 2023-12-13 NOTE — ED Notes (Signed)
 RT instructed with IS. Patient had used one before and had no issues. Will monitor as needed
